# Patient Record
Sex: Male | Born: 1950 | Race: White | Hispanic: No | Marital: Single | Smoking: Former smoker
Health system: Southern US, Community
[De-identification: ages and names within clinical notes are randomized; demographics above are authoritative.]

## PROBLEM LIST (undated history)

## (undated) DIAGNOSIS — J984 Other disorders of lung: Secondary | ICD-10-CM

## (undated) DIAGNOSIS — J449 Chronic obstructive pulmonary disease, unspecified: Secondary | ICD-10-CM

## (undated) DIAGNOSIS — I4891 Unspecified atrial fibrillation: Secondary | ICD-10-CM

## (undated) DIAGNOSIS — C801 Malignant (primary) neoplasm, unspecified: Secondary | ICD-10-CM

## (undated) DIAGNOSIS — M1990 Unspecified osteoarthritis, unspecified site: Secondary | ICD-10-CM

## (undated) DIAGNOSIS — I82409 Acute embolism and thrombosis of unspecified deep veins of unspecified lower extremity: Secondary | ICD-10-CM

## (undated) DIAGNOSIS — I10 Essential (primary) hypertension: Secondary | ICD-10-CM

## (undated) DIAGNOSIS — I509 Heart failure, unspecified: Secondary | ICD-10-CM

## (undated) DIAGNOSIS — I499 Cardiac arrhythmia, unspecified: Secondary | ICD-10-CM

## (undated) DIAGNOSIS — R51 Headache: Secondary | ICD-10-CM

## (undated) DIAGNOSIS — Z923 Personal history of irradiation: Secondary | ICD-10-CM

## (undated) DIAGNOSIS — J189 Pneumonia, unspecified organism: Secondary | ICD-10-CM

## (undated) DIAGNOSIS — N189 Chronic kidney disease, unspecified: Secondary | ICD-10-CM

## (undated) DIAGNOSIS — Z9221 Personal history of antineoplastic chemotherapy: Secondary | ICD-10-CM

## (undated) DIAGNOSIS — R519 Headache, unspecified: Secondary | ICD-10-CM

## (undated) HISTORY — DX: Essential (primary) hypertension: I10

## (undated) HISTORY — DX: Pneumonia, unspecified organism: J18.9

## (undated) HISTORY — DX: Chronic kidney disease, unspecified: N18.9

## (undated) HISTORY — DX: Malignant (primary) neoplasm, unspecified: C80.1

## (undated) HISTORY — PX: TONSILLECTOMY: SUR1361

## (undated) HISTORY — DX: Chronic obstructive pulmonary disease, unspecified: J44.9

## (undated) HISTORY — PX: KNEE SURGERY: SHX244

## (undated) HISTORY — DX: Unspecified atrial fibrillation: I48.91

## (undated) HISTORY — DX: Heart failure, unspecified: I50.9

## (undated) HISTORY — DX: Other disorders of lung: J98.4

---

## 2013-08-09 ENCOUNTER — Inpatient Hospital Stay: Payer: Self-pay | Admitting: Internal Medicine

## 2013-08-09 LAB — URINALYSIS, COMPLETE
Bacteria: NONE SEEN
Bilirubin,UR: NEGATIVE
Glucose,UR: NEGATIVE mg/dL (ref 0–75)
Ketone: NEGATIVE
LEUKOCYTE ESTERASE: NEGATIVE
NITRITE: NEGATIVE
Ph: 5 (ref 4.5–8.0)
RBC,UR: 1 /HPF (ref 0–5)
SQUAMOUS EPITHELIAL: NONE SEEN
Specific Gravity: 1.044 (ref 1.003–1.030)

## 2013-08-09 LAB — COMPREHENSIVE METABOLIC PANEL
ALK PHOS: 100 U/L
ALT: 18 U/L (ref 12–78)
AST: 32 U/L (ref 15–37)
Albumin: 3.2 g/dL — ABNORMAL LOW (ref 3.4–5.0)
Anion Gap: 11 (ref 7–16)
BUN: 27 mg/dL — ABNORMAL HIGH (ref 7–18)
Bilirubin,Total: 2 mg/dL — ABNORMAL HIGH (ref 0.2–1.0)
CALCIUM: 8.4 mg/dL — AB (ref 8.5–10.1)
CO2: 24 mmol/L (ref 21–32)
Chloride: 96 mmol/L — ABNORMAL LOW (ref 98–107)
Creatinine: 1.48 mg/dL — ABNORMAL HIGH (ref 0.60–1.30)
GFR CALC AF AMER: 58 — AB
GFR CALC NON AF AMER: 50 — AB
Glucose: 116 mg/dL — ABNORMAL HIGH (ref 65–99)
Osmolality: 269 (ref 275–301)
Potassium: 3.6 mmol/L (ref 3.5–5.1)
Sodium: 131 mmol/L — ABNORMAL LOW (ref 136–145)
TOTAL PROTEIN: 7.4 g/dL (ref 6.4–8.2)

## 2013-08-09 LAB — CK: CK, TOTAL: 723 U/L — AB

## 2013-08-09 LAB — CK-MB
CK-MB: 1.9 ng/mL (ref 0.5–3.6)
CK-MB: 2.7 ng/mL (ref 0.5–3.6)
CK-MB: 3 ng/mL (ref 0.5–3.6)

## 2013-08-09 LAB — CBC
HCT: 43 % (ref 40.0–52.0)
HGB: 14.2 g/dL (ref 13.0–18.0)
MCH: 27.9 pg (ref 26.0–34.0)
MCHC: 33.1 g/dL (ref 32.0–36.0)
MCV: 84 fL (ref 80–100)
PLATELETS: 260 10*3/uL (ref 150–440)
RBC: 5.11 10*6/uL (ref 4.40–5.90)
RDW: 13.7 % (ref 11.5–14.5)
WBC: 24.9 10*3/uL — AB (ref 3.8–10.6)

## 2013-08-09 LAB — PRO B NATRIURETIC PEPTIDE: B-Type Natriuretic Peptide: 3018 pg/mL — ABNORMAL HIGH (ref 0–125)

## 2013-08-09 LAB — TROPONIN I
TROPONIN-I: 1.2 ng/mL — AB
Troponin-I: 1.2 ng/mL — ABNORMAL HIGH
Troponin-I: 1.3 ng/mL — ABNORMAL HIGH

## 2013-08-09 LAB — LIPASE, BLOOD: LIPASE: 32 U/L — AB (ref 73–393)

## 2013-08-10 LAB — BASIC METABOLIC PANEL
Anion Gap: 10 (ref 7–16)
BUN: 34 mg/dL — ABNORMAL HIGH (ref 7–18)
CALCIUM: 8 mg/dL — AB (ref 8.5–10.1)
CO2: 28 mmol/L (ref 21–32)
CREATININE: 1.58 mg/dL — AB (ref 0.60–1.30)
Chloride: 97 mmol/L — ABNORMAL LOW (ref 98–107)
EGFR (African American): 54 — ABNORMAL LOW
GFR CALC NON AF AMER: 46 — AB
Glucose: 140 mg/dL — ABNORMAL HIGH (ref 65–99)
Osmolality: 280 (ref 275–301)
Potassium: 3.4 mmol/L — ABNORMAL LOW (ref 3.5–5.1)
Sodium: 135 mmol/L — ABNORMAL LOW (ref 136–145)

## 2013-08-10 LAB — CBC WITH DIFFERENTIAL/PLATELET
BANDS NEUTROPHIL: 18 %
Comment - H1-Com4: NORMAL
Eosinophil: 1 %
HCT: 43.5 % (ref 40.0–52.0)
HGB: 14.8 g/dL (ref 13.0–18.0)
Lymphocytes: 11 %
MCH: 28.7 pg (ref 26.0–34.0)
MCHC: 34 g/dL (ref 32.0–36.0)
MCV: 84 fL (ref 80–100)
METAMYELOCYTE: 6 %
MONOS PCT: 4 %
MYELOCYTE: 1 %
PLATELETS: 202 10*3/uL (ref 150–440)
RBC: 5.16 10*6/uL (ref 4.40–5.90)
RDW: 13.5 % (ref 11.5–14.5)
Segmented Neutrophils: 59 %
WBC: 19.2 10*3/uL — AB (ref 3.8–10.6)

## 2013-08-10 LAB — MAGNESIUM: MAGNESIUM: 1.9 mg/dL

## 2013-08-11 LAB — CBC WITH DIFFERENTIAL/PLATELET
BASOS ABS: 0 10*3/uL (ref 0.0–0.1)
BASOS PCT: 0.2 %
EOS ABS: 0 10*3/uL (ref 0.0–0.7)
Eosinophil %: 0.1 %
HCT: 40 % (ref 40.0–52.0)
HGB: 13.3 g/dL (ref 13.0–18.0)
LYMPHS ABS: 0.8 10*3/uL — AB (ref 1.0–3.6)
Lymphocyte %: 3.9 %
MCH: 27.5 pg (ref 26.0–34.0)
MCHC: 33.1 g/dL (ref 32.0–36.0)
MCV: 83 fL (ref 80–100)
MONOS PCT: 6.7 %
Monocyte #: 1.5 x10 3/mm — ABNORMAL HIGH (ref 0.2–1.0)
Neutrophil #: 19.4 10*3/uL — ABNORMAL HIGH (ref 1.4–6.5)
Neutrophil %: 89.1 %
Platelet: 197 10*3/uL (ref 150–440)
RBC: 4.83 10*6/uL (ref 4.40–5.90)
RDW: 13.4 % (ref 11.5–14.5)
WBC: 21.7 10*3/uL — AB (ref 3.8–10.6)

## 2013-08-11 LAB — BASIC METABOLIC PANEL
Anion Gap: 6 — ABNORMAL LOW (ref 7–16)
BUN: 30 mg/dL — ABNORMAL HIGH (ref 7–18)
CO2: 27 mmol/L (ref 21–32)
Calcium, Total: 8 mg/dL — ABNORMAL LOW (ref 8.5–10.1)
Chloride: 103 mmol/L (ref 98–107)
Creatinine: 1.17 mg/dL (ref 0.60–1.30)
Glucose: 189 mg/dL — ABNORMAL HIGH (ref 65–99)
Osmolality: 283 (ref 275–301)
Potassium: 3.7 mmol/L (ref 3.5–5.1)
Sodium: 136 mmol/L (ref 136–145)

## 2013-08-11 LAB — LIPID PANEL
CHOLESTEROL: 100 mg/dL (ref 0–200)
HDL: 7 mg/dL — AB (ref 40–60)
LDL CHOLESTEROL, CALC: 40 mg/dL (ref 0–100)
Triglycerides: 266 mg/dL — ABNORMAL HIGH (ref 0–200)
VLDL CHOLESTEROL, CALC: 53 mg/dL — AB (ref 5–40)

## 2013-08-11 LAB — URINE CULTURE

## 2013-08-11 LAB — HEMOGLOBIN A1C: Hemoglobin A1C: 5.9 % (ref 4.2–6.3)

## 2013-08-12 LAB — BASIC METABOLIC PANEL
Anion Gap: 4 — ABNORMAL LOW (ref 7–16)
BUN: 27 mg/dL — ABNORMAL HIGH (ref 7–18)
CALCIUM: 8 mg/dL — AB (ref 8.5–10.1)
CHLORIDE: 106 mmol/L (ref 98–107)
CO2: 28 mmol/L (ref 21–32)
CREATININE: 1.01 mg/dL (ref 0.60–1.30)
EGFR (Non-African Amer.): >60
Glucose: 140 mg/dL — ABNORMAL HIGH (ref 65–99)
OSMOLALITY: 283 (ref 275–301)
Potassium: 4 mmol/L (ref 3.5–5.1)
Sodium: 138 mmol/L (ref 136–145)

## 2013-08-12 LAB — CBC WITH DIFFERENTIAL/PLATELET
BASOS PCT: 0.1 %
Basophil #: 0 10*3/uL (ref 0.0–0.1)
Eosinophil #: 0 10*3/uL (ref 0.0–0.7)
Eosinophil %: 0 %
HCT: 37.1 % — ABNORMAL LOW (ref 40.0–52.0)
HGB: 12.5 g/dL — ABNORMAL LOW (ref 13.0–18.0)
LYMPHS ABS: 1.2 10*3/uL (ref 1.0–3.6)
Lymphocyte %: 4.5 %
MCH: 28.1 pg (ref 26.0–34.0)
MCHC: 33.7 g/dL (ref 32.0–36.0)
MCV: 83 fL (ref 80–100)
MONO ABS: 1.2 x10 3/mm — AB (ref 0.2–1.0)
Monocyte %: 4.5 %
NEUTROS ABS: 23.4 10*3/uL — AB (ref 1.4–6.5)
Neutrophil %: 90.9 %
Platelet: 211 10*3/uL (ref 150–440)
RBC: 4.44 10*6/uL (ref 4.40–5.90)
RDW: 13.7 % (ref 11.5–14.5)
WBC: 25.8 10*3/uL — AB (ref 3.8–10.6)

## 2013-08-12 LAB — MAGNESIUM: Magnesium: 2.2 mg/dL

## 2013-08-13 LAB — CBC WITH DIFFERENTIAL/PLATELET
BASOS ABS: 0 10*3/uL (ref 0.0–0.1)
Basophil %: 0.1 %
EOS ABS: 0 10*3/uL (ref 0.0–0.7)
Eosinophil %: 0 %
HCT: 35.9 % — AB (ref 40.0–52.0)
HGB: 11.7 g/dL — ABNORMAL LOW (ref 13.0–18.0)
Lymphocyte #: 1.3 10*3/uL (ref 1.0–3.6)
Lymphocyte %: 6.8 %
MCH: 27.5 pg (ref 26.0–34.0)
MCHC: 32.6 g/dL (ref 32.0–36.0)
MCV: 85 fL (ref 80–100)
Monocyte #: 1 x10 3/mm (ref 0.2–1.0)
Monocyte %: 4.9 %
Neutrophil #: 17.1 10*3/uL — ABNORMAL HIGH (ref 1.4–6.5)
Neutrophil %: 88.2 %
PLATELETS: 225 10*3/uL (ref 150–440)
RBC: 4.26 10*6/uL — ABNORMAL LOW (ref 4.40–5.90)
RDW: 13.9 % (ref 11.5–14.5)
WBC: 19.4 10*3/uL — ABNORMAL HIGH (ref 3.8–10.6)

## 2013-08-13 LAB — BASIC METABOLIC PANEL
ANION GAP: 4 — AB (ref 7–16)
BUN: 30 mg/dL — AB (ref 7–18)
CREATININE: 1 mg/dL (ref 0.60–1.30)
Calcium, Total: 8 mg/dL — ABNORMAL LOW (ref 8.5–10.1)
Chloride: 105 mmol/L (ref 98–107)
Co2: 29 mmol/L (ref 21–32)
EGFR (African American): >60
EGFR (Non-African Amer.): >60
Glucose: 147 mg/dL — ABNORMAL HIGH (ref 65–99)
Osmolality: 285 (ref 275–301)
Potassium: 4 mmol/L (ref 3.5–5.1)
SODIUM: 138 mmol/L (ref 136–145)

## 2013-08-14 LAB — CBC WITH DIFFERENTIAL/PLATELET
BASOS PCT: 0.2 %
Basophil #: 0 10*3/uL (ref 0.0–0.1)
EOS ABS: 0.5 10*3/uL (ref 0.0–0.7)
Eosinophil %: 2.4 %
HCT: 39.4 % — AB (ref 40.0–52.0)
HGB: 12.9 g/dL — ABNORMAL LOW (ref 13.0–18.0)
Lymphocyte #: 1.8 10*3/uL (ref 1.0–3.6)
Lymphocyte %: 8.3 %
MCH: 27.6 pg (ref 26.0–34.0)
MCHC: 32.7 g/dL (ref 32.0–36.0)
MCV: 84 fL (ref 80–100)
MONO ABS: 0.9 x10 3/mm (ref 0.2–1.0)
Monocyte %: 4.2 %
NEUTROS PCT: 84.9 %
Neutrophil #: 18.3 10*3/uL — ABNORMAL HIGH (ref 1.4–6.5)
Platelet: 278 10*3/uL (ref 150–440)
RBC: 4.67 10*6/uL (ref 4.40–5.90)
RDW: 13.9 % (ref 11.5–14.5)
WBC: 21.5 10*3/uL — AB (ref 3.8–10.6)

## 2013-08-14 LAB — CULTURE, BLOOD (SINGLE)

## 2013-08-15 LAB — CBC WITH DIFFERENTIAL/PLATELET
BASOS ABS: 0 10*3/uL (ref 0.0–0.1)
BASOS PCT: 0.1 %
EOS PCT: 5.1 %
Eosinophil #: 1.4 10*3/uL — ABNORMAL HIGH (ref 0.0–0.7)
HCT: 41.1 % (ref 40.0–52.0)
HGB: 13.7 g/dL (ref 13.0–18.0)
LYMPHS ABS: 2.2 10*3/uL (ref 1.0–3.6)
Lymphocyte %: 8 %
MCH: 28 pg (ref 26.0–34.0)
MCHC: 33.4 g/dL (ref 32.0–36.0)
MCV: 84 fL (ref 80–100)
Monocyte #: 0.1 x10 3/mm — ABNORMAL LOW (ref 0.2–1.0)
Monocyte %: 0.2 %
NEUTROS ABS: 23.5 10*3/uL — AB (ref 1.4–6.5)
Neutrophil %: 86.6 %
Platelet: 371 10*3/uL (ref 150–440)
RBC: 4.91 10*6/uL (ref 4.40–5.90)
RDW: 14 % (ref 11.5–14.5)
WBC: 27.1 10*3/uL — ABNORMAL HIGH (ref 3.8–10.6)

## 2013-08-18 LAB — CULTURE, BLOOD (SINGLE)

## 2013-11-22 DIAGNOSIS — J449 Chronic obstructive pulmonary disease, unspecified: Secondary | ICD-10-CM | POA: Insufficient documentation

## 2013-11-22 DIAGNOSIS — J189 Pneumonia, unspecified organism: Secondary | ICD-10-CM | POA: Insufficient documentation

## 2013-11-22 DIAGNOSIS — I509 Heart failure, unspecified: Secondary | ICD-10-CM | POA: Insufficient documentation

## 2013-11-22 DIAGNOSIS — I4891 Unspecified atrial fibrillation: Secondary | ICD-10-CM | POA: Insufficient documentation

## 2013-11-22 DIAGNOSIS — N189 Chronic kidney disease, unspecified: Secondary | ICD-10-CM | POA: Insufficient documentation

## 2013-11-25 DIAGNOSIS — J984 Other disorders of lung: Secondary | ICD-10-CM | POA: Insufficient documentation

## 2013-11-25 DIAGNOSIS — I10 Essential (primary) hypertension: Secondary | ICD-10-CM | POA: Insufficient documentation

## 2014-08-30 NOTE — H&P (Signed)
PATIENT NAME:  Sean Hall, PROPES MR#:  [id number] DATE OF BIRTH:  1950/10/12  DATE OF ADMISSION:  08/09/2013  ADMITTING PHYSICIAN: Gladstone Lighter, MD  PRIMARY CARE PHYSICIAN: None.   CHIEF COMPLAINT: Difficulty breathing.   HISTORY OF PRESENT ILLNESS: Mr. Brandow is a 64 year old Caucasian male with no known significant past medical history because he has not seen a physician for several years. 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 He has been doing fine up until 2 days ago when he started to have some sinus drainage, cough and congestion. He did not take any medications or did not see a physician and slowly his breathing got worse to the point that he was tachypneic and was having coarse wheeze and gurgling sensation breathing last night. His wife convinced him to come to the ER this morning. He is hypoxic and on nonrebreather mask at this time, has an elevated white count and chest x-ray showing significant multifocal pneumonia, which was confirmed by the CT scan. CT of the chest did not show any PE. The patient complains of cough, low-grade fevers and chills, congestion and dyspnea on exertion. He had also some nausea and vomiting yesterday. No chest pain, although he has some left-sided abdominal pain which is pleuritic in nature, could be in the lower chest area.  PAST MEDICAL HISTORY: Tobacco use disorder, undiagnosed underlying COPD.  PAST SURGICAL HISTORY: Knee surgery in the past.   ALLERGIES TO MEDICATIONS: Aleve can cause rash and hives.  CURRENT HOME MEDICATIONS: Multivitamin over-the-counter once a day.  SOCIAL HISTORY: Works as a Comptroller. Lives at home with his wife. No alcohol or drug use. Smokes about 1 pack every day.   FAMILY HISTORY: Significant for hypertension.  REVIEW OF SYSTEMS:  CONSTITUTIONAL: Positive for fever, fatigue and weakness.  EYES: No blurred vision, double  vision, inflammation or glaucoma.  ENT: No tinnitus, ear pain, hearing loss, epistaxis or discharge.  RESPIRATORY: Positive for cough, wheeze, COPD and dyspnea.  CARDIOVASCULAR: No chest pain. Positive for orthopnea. No edema, arrhythmia. Positive dyspnea on exertion. No palpitations or syncope.  GASTROINTESTINAL: Positive for nausea and vomiting. No diarrhea, abdominal pain. No hematemesis or melena.  GENITOURINARY: No dysuria, hematuria, renal calculus, frequency or incontinence.  ENDOCRINE: No polyuria, nocturia, thyroid problems, heat or cold intolerance.  HEMATOLOGY: No anemia, easy bruising or bleeding.  SKIN: No acne, rash or lesions.  MUSCULOSKELETAL: No neck, back, shoulder pain, arthritis or gout.  NEUROLOGICAL: No numbness, weakness, CVA, TIA or seizures.  PSYCHOLOGICAL: No anxiety, insomnia, depression.   PHYSICAL EXAMINATION: VITAL SIGNS: Temperature 97.2 degrees Fahrenheit, pulse 125, respirations 22, blood pressure 154/70, pulse ox 88% on room air.  GENERAL: Well-built, well-nourished disheveled-appearing male lying in bed in mild acute respiratory distress.  HEENT: Normocephalic, atraumatic. Pupils are equal, round, and reacting to light. Anicteric sclerae. Extraocular movements intact. Oropharynx clear without erythema, mass or exudate.  NECK: Supple. No thyromegaly, JVD or carotid bruits. No lymphadenopathy.  LUNGS: Coarse wheeze and crackles heard bilateral in both lung diffusely, more prominent in the lower lobes. Minimal use of accessory muscles on breathing, especially with exertion. HEART: S1 and S2 rapid rate and regular rhythm. No murmurs, rubs or gallops.  ABDOMEN: Soft, nontender, nondistended. No hepatosplenomegaly. Normal bowel sounds.  EXTREMITIES: No pedal edema. No clubbing or cyanosis. 2+ dorsalis pedis pulses palpable bilaterally.  SKIN: No acne, rash or lesions.  LYMPHATICS: No  cervical lymphadenopathy.  NEUROLOGIC: Cranial nerves intact. No focal motor or  sensory deficits. PSYCHOLOGIC: The patient is awake, alert and oriented x3.   DIAGNOSTIC DATA: Laboratory: WBC 24.9, hemoglobin 14.2, hematocrit 43, platelet count 216,000.   Sodium 131, potassium 3.6, chloride 96, bicarb 24, BUN 27, creatinine 1.48. glucose 116 and calcium 8.4.   ALT 18, AST 32, alkaline phosphatase 100, total bili 2.0, albumin 3.2. Lipase 32 and troponin 1.2  CPK/MB is 1.9. BNP is elevated at 3,018.   Chest x-ray revealing suspected left lung mass measuring 14 cm, less likely the appearance could be cause for infection too. CT of the chest is recommended.   CT of the chest done showing no evidence of any PE. There is moderate consolidation over left upper lobe with associated air bronchograms, minimal patchy hazy nodular opacification throughout the lungs. Tiny amount of pleural effusion, reactive adenopathy and multifocal pneumonia. Mild to moderate inflammatory disease and a 1.4 cm peripheral nodule over the right lower lobe. Outpatient follow-up with CT chest recommended.  EKG showing sinus tachycardia, heart rate of 115, no acute ST-T wave abnormalities.   ASSESSMENT AND PLAN: This is a 64 year old male with no significant past medical history, has not visited a physician for years, brought in for acute respiratory distress, noted to be septic and having multifocal pneumonia.  1.  Sepsis secondary to multifocal pneumonia, also has underlying chronic obstructive pulmonary disease, not diagnosed prior. However, appears tachypneic so we will get an ABG and start on broad-spectrum antibiotics with vancomycin, Zosyn and Levaquin for his pneumonia. Blood cultures have been ordered. 2.  Acute hypoxic respiratory failure, pneumonia and chronic obstructive pulmonary disease are the causes. Get an ABG, oxygen support, antibiotics, steroids, and nebs for now. 3.  Acute renal failure. Follow-up. Gentle fluids. Acute tubular necrosis from sepsis likely.  4.  Elevated troponin. No chest  pain. Could be demand ischemia. No EKG changes. However, non-ST-elevation myocardial infarction cannot be ruled out. Will monitor on telemetry. Recycle troponins. Continue aspirin and on therapeutic treatment with Lovenox until the next troponins are back.  5.  Tobacco use disorder. Counseled against smoking for 3 minutes and placed on nicotine patch.   CODE STATUS: FULL.  TIME SPENT ON ADMISSION: 50 minutes.  ____________________________ Gladstone Lighter, MD rk:sb D: 08/09/2013 15:32:05 ET T: 08/09/2013 15:52:31 ET JOB#: [id number]  cc: Gladstone Lighter, MD, <Dictator> Gladstone Lighter MD ELECTRONICALLY SIGNED 08/15/2013 14:09

## 2014-08-30 NOTE — Consult Note (Signed)
PATIENT NAME:  Sean Hall, Sean Hall MR#:  [id number] DATE OF BIRTH:  30-Jan-1951  DATE OF CONSULTATION:  08/11/2013  REFERRING PHYSICIAN:  Dr. Bridgett Larsson. CONSULTING PHYSICIAN:  Isaias Cowman, MD  PRIMARY CARE PHYSICIAN:  None.    CHIEF COMPLAINT: Shortness of breath.   REASON FOR CONSULTATION: Consultation requested for evaluation of atrial fibrillation.   HISTORY OF PRESENT ILLNESS: The patient is a 64 year old gentleman without significant past medical history. The patient was in his usual state of health until 2 days prior to admission when he started to experience upper respiratory infection with cough, sinus drainage and congestion. On the morning of admission, the patient felt much worse, started developing fever and chills and presented to [facility] Emergency Room. In the Emergency Room, the patient was noted to be in respiratory distress, hypoxic. Chest x-ray revealing evidence for pneumonia. CT scan was negative for pulmonary emboli. The patient was also noted to be in tachycardia. EKG revealing atrial fibrillation with a rapid ventricular rate. The patient also had acute renal failure felt to be consistent with dehydration.   PAST MEDICAL HISTORY: None.   MEDICATIONS: None.   SOCIAL HISTORY: The patient lives with his girlfriend. He smokes a pack of cigarettes a day. He works as a Building control surveyor.   FAMILY HISTORY: No immediate family history of coronary artery disease or myocardial infarction.   REVIEW OF SYSTEMS:    CONSTITUTIONAL: The patient denied fever or chills.  EYES: No blurry vision.  EARS: No hearing loss.  RESPIRATORY: The patient has shortness of breath, cough and congestion.  CARDIOVASCULAR: The patient denies chest pain or palpitations.  GASTROINTESTINAL: No nausea, vomiting or diarrhea.  GENITOURINARY: No dysuria or hematuria.  ENDOCRINE: No polyuria or polydipsia.  MUSCULOSKELETAL: No arthralgias or myalgias.  NEUROLOGICAL: No focal muscle weakness or numbness.   PSYCHOLOGICAL: No depression or anxiety.   PHYSICAL EXAMINATION: VITAL SIGNS: Blood pressure 115/73, pulse 111 and irregular, respirations 18, temperature 97.6, pulse ox 94%.  HEENT: Pupils equal, reactive to light and accommodation.  NECK: Supple without thyromegaly.  LUNGS: Clear.  CARDIOVASCULAR:  Normal JVP. Normal PMI. Regular rate and rhythm. Normal S1, S2. No appreciable gallop, murmur or rub.  ABDOMEN: Soft and nontender. Pulses were intact bilaterally.  MUSCULOSKELETAL: Normal muscle tone.  NEUROLOGIC: The patient is alert and oriented x 3. Motor and sensory both grossly intact.   IMPRESSION: A 64 year old gentleman who presents with pneumonia, noted to be in atrial fibrillation which appears asymptomatic. Heart rate still moderately elevated. The patient has a CHADS2 score of zero.   RECOMMENDATIONS: 1.  Agree with overall current therapy.  2.  Continue aspirin for stroke prevention.  3.  Up-titrate diltiazem to 60 mg p.o. q.6.  4.  Review 2-D echocardiogram.   ____________________________ Isaias Cowman, MD ap:cs D: 08/11/2013 11:21:31 ET T: 08/11/2013 14:39:26 ET JOB#: [id number]  cc: Isaias Cowman, MD, <Dictator> Isaias Cowman MD ELECTRONICALLY SIGNED 09/10/2013 12:43

## 2014-08-30 NOTE — Discharge Summary (Signed)
PATIENT NAME:  Sean Hall, Sean Hall MR#:  [id number] DATE OF BIRTH:  07/24/1950  DATE OF ADMISSION:  08/09/2013 DATE OF DISCHARGE:  08/15/2013  DISCHARGE DIAGNOSES:  1. Sepsis secondary to pneumonia.  2. Acute renal failure.  3. Chronic obstructive pulmonary disease. 4. Acute hypoxic respiratory failure.  5. New-onset atrial fibrillation.  6. Acute myocardial infarction.   DISCHARGE MEDICATIONS:  1. Aspirin 325 mg p.o. daily.  2. furosemide 20 > mg p.o. daily. 3. Cardizem 120 mg p.o. daily.  4. Levaquin 750 mg p.o. daily for 7 days.  5. Prednisone 20 mg 2 tablets daily for 2 days and 1 tablet daily for 2 days and then stop.  6. Combivent Respimat 1 puff 4 times daily.   CONSULTATIONS: Cardiology consult with Dr. Saralyn Pilar.   FOLLOWUP: The patient has a followup appointment with Dr. Saralyn Pilar on April 22nd at 2:30 p.m. The patient advised to make a new appointment with primary doctor. He wants to see Dr. Ola Spurr, so I told him to follow up with him as his primary doctor.   HOSPITAL COURSE: This is a 64 year old male patient with no past medical history except for history of tobacco abuse, who came in because of shortness of breath, cough and congestion. The patient's x-ray of chest showed bilateral pneumonia, and CT chest did not show any pulmonary emboli. The patient was tachycardic on admission with heart rate 125 and O2 saturations were 88% on room air.   1. Acute hypoxic respiratory failure secondary to multilobar pneumonia. He was given vancomycin, Levaquin, Zosyn along with Solu-Medrol, oxygen and nebulizers. The patient's blood cultures came back positive for Streptococcal pneumoniae.  2. Sepsis due to Streptococcal pneumoniae coming from pneumonia. The patient already got vancomycin, Levaquin and Zosyn, and he received fluids. The patient's symptoms improved, and his repeat blood cultures came back negative. His initial white count on admission was 24.9.  3. New-onset atrial  fibrillation with rapid ventricular response. The patient was seen by Dr. Saralyn Pilar. The patient CHADS2 score was zero. He suggested to continue only aspirin and Cardizem. The patient continued to be in atrial fibrillation. The patient will follow up with Dr. Saralyn Pilar as an outpatient regarding repeating echo and possibly adjusting his medications. Right now, he is on aspirin and Cardizem. Initially he was on Cardizem 60 q.6, then changed to Cardizem CD 240 mg daily, but he developed bradycardia with heart rate in 45s, so I had to cut down on Cardizem, and the patient discharged home with Cardizem CD 120 mg daily.  4. Systolic heart failure. The patient's echo showed EF of 35%, and he had troponin elevation of 1.30 and then initial one was 1.20. The patient's CK was normal. EKG did not show any acute changes, and the patient's troponin elevation thought to be secondary to demand ischemia because of respiratory failure.  5. Acute renal failure. The patient's kidney function on admission showed BUN of 27, creatinine 1.48. He received IV fluids, and his renal function normalized. The patient's creatinine was 1 on April 7th.   6. Leukocytosis due to steroids. The patient is afebrile, but white count continued to be around 21, and then the patient's steroids were weaned down. I told him that he will follow up with Dr. Ola Spurr to make sure his white count is coming down.  7. Possible pulmonary nodule. Seen by Dr. Devona Konig. CT chest was done on April 3rd which showed no evidence of PE. It showed moderate consolidation in the left upper lobe with air bronchograms  and some atelectasis, and the patient has reactive lymphadenopathy, and there is a 1.4 cm peripheral nodule in the right lower lobe. The patient needs to have repeat CAT scan once the pneumonia resolves, and he needs a CAT scan in 4 to 6 weeks. The patient will follow up with Dr. Devona Konig, and also he will follow with Dr. Saralyn Pilar. The patient is a  smoker, and he told me that he is going to quit.  8. The patient's LDL was less than 100.  9. Regarding his systolic heart failure, the patient is on Lasix. He did receive IV Lasix. He will be needing probably ACE inhibitors. Dr. Saralyn Pilar can start that.  10. Regarding his myocardial infarction, the patient was given aspirin, but no beta blockers because of his COPD flare, and the patient not given statins because his LDL is less than 100.   TIME SPENT ON DISCHARGE PREPARATION: More than 30 minutes.   ____________________________ Epifanio Lesches, MD sk:lb D: 08/16/2013 42:68:34 ET T: 08/17/2013 06:54:22 ET JOB#: [id number]  cc: Epifanio Lesches, MD, <Dictator> Epifanio Lesches MD ELECTRONICALLY SIGNED 08/31/2013 7:45

## 2015-05-10 DIAGNOSIS — C801 Malignant (primary) neoplasm, unspecified: Secondary | ICD-10-CM

## 2015-05-10 HISTORY — DX: Malignant (primary) neoplasm, unspecified: C80.1

## 2015-09-10 ENCOUNTER — Encounter: Payer: Self-pay | Admitting: *Deleted

## 2015-09-10 ENCOUNTER — Inpatient Hospital Stay: Payer: Medicaid Other | Attending: Cardiothoracic Surgery | Admitting: Cardiothoracic Surgery

## 2015-09-10 ENCOUNTER — Encounter: Payer: Self-pay | Admitting: Cardiothoracic Surgery

## 2015-09-10 VITALS — BP 151/75 | HR 83 | Temp 97.0°F | Ht 75.0 in | Wt 168.8 lb

## 2015-09-10 DIAGNOSIS — R05 Cough: Secondary | ICD-10-CM | POA: Insufficient documentation

## 2015-09-10 DIAGNOSIS — R634 Abnormal weight loss: Secondary | ICD-10-CM | POA: Diagnosis not present

## 2015-09-10 DIAGNOSIS — R918 Other nonspecific abnormal finding of lung field: Secondary | ICD-10-CM | POA: Diagnosis present

## 2015-09-10 DIAGNOSIS — R0602 Shortness of breath: Secondary | ICD-10-CM | POA: Diagnosis not present

## 2015-09-10 DIAGNOSIS — Z87891 Personal history of nicotine dependence: Secondary | ICD-10-CM | POA: Insufficient documentation

## 2015-09-10 DIAGNOSIS — Z7982 Long term (current) use of aspirin: Secondary | ICD-10-CM | POA: Insufficient documentation

## 2015-09-10 NOTE — Progress Notes (Signed)
Patient here for initial consult. Recently had lung infection, has experienced 30 pound wt. Loss since January.

## 2015-09-10 NOTE — Progress Notes (Signed)
Patient ID: Sean Hall, male   DOB: [birth year], 65 y.o.   MRN: [id number]  Chief Complaint  Patient presents with  . Lung Mass    Referred By Dr. Kem Kays  Reason for Referral Lung mass  HPI Location, Quality, Duration, Severity, Timing, Context, Modifying Factors, Associated Signs and Symptoms.  Sean Hall is a 65 y.o. male.  This gentleman is referred to [place] today by Dr. Sabra Heck. He states that he was in his usual state of health until a couple months ago when he began experiencing increasing shortness of breath and cough associated with occasional green sputum but no hemoptysis. In addition his had a proximal 30 pound weight loss over the last several months and when he presented to his primary care physician a chest x-ray was obtained. Chest x-ray was abnormal and he was referred here for further consideration of diagnostic or therapeutic options. Unfortunately the chest x-ray was obtained at the current nodal clinic and I do not have access to that at this time. The patient states that he does not have medical insurance and although he was instructed to obtain a CT scan he has been reluctant to do so because of the cost involved. He states that he was diagnosed with pneumonia and placed on Levaquin. He denied any fevers but did have occasional night sweats. He is a lifelong smoker but quit in 2014 having smoked 2 packs cigarettes a day for close to 50 years. There is no family history of lung cancer. 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   No past medical history on file.  History reviewed. No pertinent past surgical history.  Family History  Problem Relation Age of Onset  . Diabetes Father   . Hypertension Father     Social History Social History  Substance Use Topics  . Smoking status: None  . Smokeless tobacco: None  . Alcohol Use: None    Allergies  Allergen Reactions  . Aleve [Naproxen Sodium] Hives, Shortness Of  Breath and Swelling    Naproxin    Current Outpatient Prescriptions  Medication Sig Dispense Refill  . albuterol (PROAIR HFA) 108 (90 Base) MCG/ACT inhaler Inhale into the lungs.    . benzonatate (TESSALON) 200 MG capsule Take by mouth.    . levofloxacin (LEVAQUIN) 500 MG tablet Take by mouth.    Marland Kitchen aspirin EC 81 MG tablet Take by mouth.    . Multiple Vitamin (MULTI-VITAMINS) TABS Take by mouth.     No current facility-administered medications for this visit.      Review of Systems A complete review of systems was asked and was negative except for the following positive findingsWeight loss of 30 pounds, night sweats, hearing difficulty, chest pain, palpitations, shortness of breath, cough, wheezing, trouble swallowing, urination, joint pain, joint stiffness, frequent headaches, localize weakness, excessive thirst and urination, heat and cold intolerance, hayfever, hives.  Blood pressure 151/75, pulse 83, temperature 97 F (36.1 C), temperature source Tympanic, height '6\' 3"'$  (1.905 m), weight 168 lb 12.2 oz (76.55 kg).  Physical Exam CONSTITUTIONAL:  Pleasant, well-developed, well-nourished, and in no acute distress. EYES: Pupils equal and reactive to light, Sclera non-icteric EARS, NOSE, MOUTH AND THROAT:  The oropharynx was clear.  Dentition is good repair.  Oral mucosa pink and moist. LYMPH NODES:  Lymph nodes in the neck and axillae were normal RESPIRATORY:  Lungs were clear.  Normal respiratory effort without pathologic use of accessory muscles of respiration CARDIOVASCULAR: Heart was  regular without murmurs.  There were no carotid bruits. GI: The abdomen was soft, nontender, and nondistended. There were no palpable masses. There was no hepatosplenomegaly. There were normal bowel sounds in all quadrants. GU:  Rectal deferred.   MUSCULOSKELETAL:  Normal muscle strength and tone.  No clubbing or cyanosis.   SKIN:  There were no pathologic skin lesions.  There were no nodules on  palpation. NEUROLOGIC:  Sensation is normal.  Cranial nerves are grossly intact. PSYCH:  Oriented to person, place and time.  Mood and affect are normal.  Data Reviewed I have reviewed the records that were in the system. This consisted of the CT scan from 2015 showing a left upper lobe mass. In addition there was a echocardiogram showing global LV dysfunction. There are no other records.  I have personally reviewed the patient's imaging, laboratory findings and medical records.    Assessment    At the present time it would appear the patient is referred here with a diagnosis of a lung mass.    Plan    I will obtain the x-ray from current nodal clinic. I told the patient that I would most likely recommend obtaining a CT scan the chest. The patient is unwilling to do that at this point in time secondary to cost. I did discuss with our thoracic navigator if he would be a candidate for a lung cancer screening CT scan. He is not a candidate secondary to his symptoms. I will see him back again in one week after I obtain the chest x-ray.       Nestor Lewandowsky, MD 09/10/2015, 10:32 AM

## 2015-09-11 ENCOUNTER — Telehealth: Payer: Self-pay | Admitting: Cardiothoracic Surgery

## 2015-09-11 NOTE — Telephone Encounter (Signed)
I have called patient to make a follow up appointment with Dr Genevive Bi for RUL Lung nodule for 09/18/15 in our [employer]. No answer. I have left a message for patient to call back to make the appointment. X-Ray disk has been obtained from [facility].

## 2015-09-11 NOTE — Progress Notes (Signed)
Met with patient at thoracic surgery appointment. Introduced navigation program and reviewed plan of care. Will follow. 

## 2015-09-14 NOTE — Telephone Encounter (Signed)
Appointment has been made for 09/18/15 with Dr Genevive Bi.

## 2015-09-17 ENCOUNTER — Other Ambulatory Visit: Payer: Self-pay | Admitting: Cardiothoracic Surgery

## 2015-09-17 ENCOUNTER — Inpatient Hospital Stay
Admission: RE | Admit: 2015-09-17 | Discharge: 2015-09-17 | Disposition: A | Discharge status: Home / Self Care | Payer: Self-pay | Source: Ambulatory Visit | Attending: Cardiothoracic Surgery | Admitting: Cardiothoracic Surgery

## 2015-09-17 DIAGNOSIS — Z139 Encounter for screening, unspecified: Secondary | ICD-10-CM

## 2015-09-18 ENCOUNTER — Encounter: Payer: Self-pay | Admitting: Cardiothoracic Surgery

## 2015-09-18 ENCOUNTER — Ambulatory Visit (INDEPENDENT_AMBULATORY_CARE_PROVIDER_SITE_OTHER): Payer: Self-pay | Admitting: Cardiothoracic Surgery

## 2015-09-18 VITALS — BP 153/77 | HR 88 | Temp 97.7°F | Ht 75.0 in | Wt 168.0 lb

## 2015-09-18 DIAGNOSIS — R918 Other nonspecific abnormal finding of lung field: Secondary | ICD-10-CM

## 2015-09-18 NOTE — Progress Notes (Signed)
Sean Hall Inpatient Post-Op Note  Patient ID: Sean Hall, male   DOB: 1950/12/17, 65 y.o.   MRN: [id number]  HISTORY: This patient returns today in follow-up. We were able to obtain his chest x-ray from the current nodal clinic and have that digitized for my review. The patient continues to complain of being significantly tired and unable to perform the normal activities that he has enjoyed in the past. He has not had any fevers and states that he feels actually better after his most recent course of antibiotics.   Filed Vitals:   09/18/15 0922  BP: 153/77  Pulse: 88  Temp: 97.7 F (36.5 C)     EXAM: Resp: Lungs are clear bilaterally With the exception of the posterior right upper lobe area with there is diminished breath sounds..  No respiratory distress, normal effort. Heart:  Regular without murmurs Abd:  Abdomen is soft, non distended and non tender. No masses are palpable.  There is no rebound and no guarding.  Neurological: Alert and oriented to person, place, and time. Coordination normal.  Skin: Skin is warm and dry. No rash noted. No diaphoretic. No erythema. No pallor.  Psychiatric: Normal mood and affect. Normal behavior. Judgment and thought content normal.    ASSESSMENT: I have independently reviewed the patient's chest x-ray. There is a very large right lung mass measuring at least 10 cm. Whether or not this is all representative of a pneumonia or a postobstructive pneumonia secondary to a more centrally obstructing tumor is unclear on plain x-ray.   PLAN:   I have recommended to the patient that he undergo CT scanning with contrast to better delineate the right midlung mass. He will have that done. He would like to get it done today but all possible. I will see him back again once this is complete.    Nestor Lewandowsky, MD

## 2015-09-22 ENCOUNTER — Ambulatory Visit (INDEPENDENT_AMBULATORY_CARE_PROVIDER_SITE_OTHER): Payer: Self-pay | Admitting: Cardiothoracic Surgery

## 2015-09-22 ENCOUNTER — Ambulatory Visit
Admission: RE | Admit: 2015-09-22 | Discharge: 2015-09-22 | Disposition: A | Discharge status: Home / Self Care | Payer: Medicaid Other | Source: Ambulatory Visit | Attending: Cardiothoracic Surgery | Admitting: Cardiothoracic Surgery

## 2015-09-22 ENCOUNTER — Encounter: Payer: Self-pay | Admitting: Cardiothoracic Surgery

## 2015-09-22 VITALS — BP 144/76 | HR 81 | Temp 97.4°F | Ht 75.0 in | Wt 169.0 lb

## 2015-09-22 DIAGNOSIS — R918 Other nonspecific abnormal finding of lung field: Secondary | ICD-10-CM | POA: Insufficient documentation

## 2015-09-22 DIAGNOSIS — J90 Pleural effusion, not elsewhere classified: Secondary | ICD-10-CM | POA: Diagnosis not present

## 2015-09-22 DIAGNOSIS — I2510 Atherosclerotic heart disease of native coronary artery without angina pectoris: Secondary | ICD-10-CM | POA: Insufficient documentation

## 2015-09-22 MED ORDER — IOHEXOL 350 MG/ML SOLN
75.0000 mL | Freq: Once | INTRAVENOUS | Status: AC | PRN
  Administered 2015-09-22: 65 mL via INTRAVENOUS

## 2015-09-22 NOTE — Progress Notes (Signed)
Sean Hall Inpatient Post-Op Note  Patient ID: Sean Hall, male   DOB: Apr 15, 1951, 65 y.o.   MRN: [id number]  HISTORY: He returns today in follow-up. He states he's not been short of breath although he continues to have a cough which is productive of sputum but no blood. He denied any fevers. He states that his breathing is actually improved over the last week. He did have a CT scan done today.   Filed Vitals:   09/22/15 1051  BP: 144/76  Pulse: 81  Temp: 97.4 F (36.3 C)     EXAM: Resp: Lungs Show diminished breath sounds throughout. This is particularly true in the right upper lobe area. There are bibasilar rales..  No respiratory distress, normal effort. Heart:  Regular without murmurs Abd:  Abdomen is soft, non distended and non tender. No masses are palpable.  There is no rebound and no guarding.  Neurological: Alert and oriented to person, place, and time. Coordination normal.  Skin: Skin is warm and dry. No rash noted. No diaphoretic. No erythema. No pallor.  Psychiatric: Normal mood and affect. Normal behavior. Judgment and thought content normal.    ASSESSMENT: I have independently reviewed the chest CT. 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 I do not believe the patient is a candidate for surgical resection.   PLAN:   I discussed this with the patient. I believe that the most efficient way to make a diagnosis would be with bronchoscopy. The patient is on aspirin which have asked him to stop. I will ask our pulmonologist to see the patient and perform a bronchoscopy. I explained the patient that this mass is something that we would treat with radiation therapy and chemotherapy and not with surgery. We will make an appointment to see our oncologist and radiation therapist as well.    Nestor Lewandowsky, MD

## 2015-10-02 ENCOUNTER — Telehealth: Payer: Self-pay

## 2015-10-02 ENCOUNTER — Encounter: Payer: Self-pay | Admitting: *Deleted

## 2015-10-02 ENCOUNTER — Encounter
Admission: RE | Disposition: A | Discharge status: Home / Self Care | Payer: Self-pay | Source: Ambulatory Visit | Attending: Pulmonary Disease

## 2015-10-02 ENCOUNTER — Encounter: Payer: Self-pay | Admitting: Certified Registered Nurse Anesthetist

## 2015-10-02 ENCOUNTER — Ambulatory Visit
Admission: RE | Admit: 2015-10-02 | Discharge: 2015-10-02 | Disposition: A | Discharge status: Home / Self Care | Payer: Medicaid Other | Source: Ambulatory Visit | Attending: Pulmonary Disease | Admitting: Pulmonary Disease

## 2015-10-02 DIAGNOSIS — C3411 Malignant neoplasm of upper lobe, right bronchus or lung: Secondary | ICD-10-CM | POA: Diagnosis not present

## 2015-10-02 DIAGNOSIS — R918 Other nonspecific abnormal finding of lung field: Secondary | ICD-10-CM | POA: Diagnosis present

## 2015-10-02 SURGERY — BRONCHOSCOPY, FLEXIBLE
Anesthesia: Moderate Sedation

## 2015-10-02 MED ORDER — MIDAZOLAM HCL 5 MG/5ML IJ SOLN
INTRAMUSCULAR | Status: AC | PRN
  Administered 2015-10-02: 4 mg via INTRAVENOUS

## 2015-10-02 MED ORDER — PHENYLEPHRINE HCL 0.25 % NA SOLN
1.0000 | Freq: Four times a day (QID) | NASAL | Status: DC | PRN
  Filled 2015-10-02: qty 15

## 2015-10-02 MED ORDER — FENTANYL CITRATE (PF) 100 MCG/2ML IJ SOLN
INTRAMUSCULAR | Status: AC
  Filled 2015-10-02: qty 4

## 2015-10-02 MED ORDER — SODIUM CHLORIDE 0.9 % IV SOLN
INTRAVENOUS | Status: DC
  Administered 2015-10-02: 12:00:00 via INTRAVENOUS

## 2015-10-02 MED ORDER — LIDOCAINE HCL 2 % EX GEL: 1.0000 "application " | Freq: Once | CUTANEOUS | Status: DC

## 2015-10-02 MED ORDER — BUTAMBEN-TETRACAINE-BENZOCAINE 2-2-14 % EX AERO
1.0000 | INHALATION_SPRAY | Freq: Once | CUTANEOUS | Status: DC
  Filled 2015-10-02: qty 20

## 2015-10-02 MED ORDER — SODIUM CHLORIDE FLUSH 0.9 % IV SOLN
INTRAVENOUS | Status: AC
Start: 2015-10-02 — End: 2015-10-03
  Filled 2015-10-02: qty 20

## 2015-10-02 MED ORDER — MIDAZOLAM HCL 5 MG/5ML IJ SOLN
INTRAMUSCULAR | Status: AC
  Filled 2015-10-02: qty 10

## 2015-10-02 MED ORDER — FENTANYL CITRATE (PF) 100 MCG/2ML IJ SOLN
INTRAMUSCULAR | Status: AC | PRN
  Administered 2015-10-02: 25 ug via INTRAVENOUS
  Administered 2015-10-02: 50 ug via INTRAVENOUS

## 2015-10-02 NOTE — H&P (Signed)
Pre procedure evaluation: Pt referred for lung mass found on CXR in early April, initially treated as PNA. Follow up CXR and CT chest revealed mass. Referred from Dr Genevive Bi. Pt is a former heavy smoker, quit in 2015. Former Building control surveyor. Reports DOE and weight loss. No hemoptysis. No bruising or bleeding reported  Filed Vitals:   10/02/15 1326 10/02/15 1332 10/02/15 1337 10/02/15 1345  BP: 175/91 169/91 156/91 134/80  Pulse: 92 95 94 92  Temp:      TempSrc:      Resp: '20 14 22 23  '$ SpO2: 99% 99% 95% 94%   Physical Exam  Constitutional: He is oriented to person, place, and time. He appears well-developed and well-nourished.  HENT:  Head: Normocephalic and atraumatic.  Eyes: EOM are normal. Pupils are equal, round, and reactive to light.  Cardiovascular: Normal rate and regular rhythm.   No murmur heard. Pulmonary/Chest: Effort normal. He has no wheezes.  Abdominal: Soft. Bowel sounds are normal.  Musculoskeletal: He exhibits no edema.  Lymphadenopathy:    He has no cervical adenopathy.  Neurological: He is alert and oriented to person, place, and time.  Skin: Skin is warm and dry.  Vitals reviewed.  BMP Latest Ref Rng 08/13/2013 08/12/2013 08/11/2013  Glucose 65-99 mg/dL 147(H) 140(H) 189(H)  BUN 7-18 mg/dL 30(H) 27(H) 30(H)  Creatinine 0.60-1.30 mg/dL 1.00 1.01 1.17  Sodium 136-145 mmol/L 138 138 136  Potassium 3.5-5.1 mmol/L 4.0 4.0 3.7  Chloride 98-107 mmol/L 105 106 103  CO2 21-32 mmol/L '29 28 27  '$ Calcium 8.5-10.1 mg/dL 8.0(L) 8.0(L) 8.0(L)    CBC    Component Value Date/Time   WBC 27.1* 08/15/2013 0648   RBC 4.91 08/15/2013 0648   HGB 13.7 08/15/2013 0648   HCT 41.1 08/15/2013 0648   PLT 371 08/15/2013 0648   MCV 84 08/15/2013 0648   MCH 28.0 08/15/2013 0648   MCHC 33.4 08/15/2013 0648   RDW 14.0 08/15/2013 0648   LYMPHSABS 2.2 08/15/2013 0648   MONOABS 0.1* 08/15/2013 0648   EOSABS 1.4* 08/15/2013 0648   BASOSABS 0.0 08/15/2013 0648   . IMPRESSION: Lung mass - likely  malignancy  PLAN: Bronchoscopy  Merton Border, MD PCCM service Mobile [phone] Pager [phone] 10/02/2015

## 2015-10-02 NOTE — Procedures (Signed)
Indication:   RUL mass  Premedication:   Fentanyl 75 mcg Midaz4 mg  Anesthesia: Topical to nose and throat 30 cc of 1% lidocaine used during the course of procedure  Procedure: After adequate sedation and anesthesia, the bronchoscope was introduced via the R naris and advanced into the posterior pharynx. Further anesthesia was obtained with 1% lidocaine and the scope was advanced into the trachea. Complete airway anesthesia was achieved with 1% lidocaine and a thorough airway examination was performed. This revealed the following findings:  Findings:  Upper airway - excess lymphoid tissue. Cords moved symmetrically Tracheobronchial anatomy - Normal configuration. RUL could not be examined Bronchial mucosa - mild chronic bronchitis changes. Tumor infiltration of anterior-medial aspect R mainstem bronchus mucosa Other - 100% obstruction of RUL bronchus with pedunculated tumor that extends into R mainstem broncus  Specimens:   Cytology brushings from RUL  Endobronchial biopsy from RUL  On site screening of specimens revealed severely atypical cells c/w malignancy  Complications: Mild bleeding resolved by time scope was removed   Post procedure evaluation:  The patient tolerated the procedure well with no major complications    I have discussed findings with Dr Genevive Bi. He does not appear to be a surgical candidate due to tumor location and the appearance of mucosal infiltration within centimeters of the main carina. Dr Genevive Bi is to refer to Rad Onc and Med Onc   Merton Border, MD PCCM service Mobile [phone] Pager [phone] 08/28/2015

## 2015-10-02 NOTE — Discharge Instructions (Signed)
Flexible Bronchoscopy, Care After Refer to this sheet in the next few weeks. These instructions provide you with information on caring for yourself after your procedure. Your health care provider may also give you more specific instructions. Your treatment has been planned according to current medical practices, but problems sometimes occur. Call your health care provider if you have any problems or questions after your procedure.  WHAT TO EXPECT AFTER THE PROCEDURE It is normal to have the following symptoms for 24-48 hours after the procedure:   Increased cough.  Low-grade fever.  Sore throat or hoarse voice.  Small streaks of blood in your thick spit (sputum) if tissue samples were taken (biopsy). HOME CARE INSTRUCTIONS   Do not eat or drink anything for 2 hours after your procedure. Your nose and throat were numbed by medicine. If you try to eat or drink before the medicine wears off, food or drink could go into your lungs or you could burn yourself. After the numbness is gone and your cough and gag reflexes have returned, you may eat soft food and drink liquids slowly.   The day after the procedure, you can go back to your normal diet.   You may resume normal activities.   Keep all follow-up visits as directed by your health care provider. It is important to keep all your appointments, especially if tissue samples were taken for testing (biopsy). SEEK IMMEDIATE MEDICAL CARE IF:   You have increasing shortness of breath.   You become light-headed or faint.   You have chest pain.   You have any new concerning symptoms.  You cough up more than a small amount of blood.  The amount of blood you cough up increases. MAKE SURE YOU:  Understand these instructions.  Will watch your condition.  Will get help right away if you are not doing well or get worse.   This information is not intended to replace advice given to you by your health care provider. Make sure you discuss  any questions you have with your health care provider.   Document Released: 11/12/2004 Document Revised: 05/16/2014 Document Reviewed: 12/28/2012 Elsevier Interactive Patient Education [employer].

## 2015-10-02 NOTE — Telephone Encounter (Signed)
Angie will send a referral for patient to be seen soon by an Materials engineer and a Facilities manager.

## 2015-10-06 LAB — SURGICAL PATHOLOGY

## 2015-10-06 LAB — CYTOLOGY - NON PAP

## 2015-10-08 ENCOUNTER — Telehealth: Payer: Self-pay | Admitting: Cardiothoracic Surgery

## 2015-10-08 NOTE — Telephone Encounter (Signed)
Patient's Friend Butch Penny Genice Rouge) has called and would like to be advised of the results from the bronchoscopy with biopsy that was done on 10/02/15 by pulmonology. This was asked to be done by Dr Genevive Bi. Patient's results are available at this time.   Patient was made aware that he has an appointment with Dr Donella Stade on 10/16/15 @ 9:30 am at [facility]. He was aware that Dr Genevive Bi did want him to be seen by oncology and radiation oncology. Both referrals were sent.

## 2015-10-08 NOTE — Telephone Encounter (Signed)
Called patient back to let him know that I had sent a message to Dr. Genevive Bi in reference to his questions. I also told him that once I received an answer from Dr. Genevive Bi, that I would return his call. Patient understood.

## 2015-10-12 ENCOUNTER — Encounter: Payer: Self-pay | Admitting: *Deleted

## 2015-10-12 ENCOUNTER — Inpatient Hospital Stay: Payer: Medicaid Other | Attending: Oncology | Admitting: Oncology

## 2015-10-12 ENCOUNTER — Ambulatory Visit
Admission: RE | Admit: 2015-10-12 | Discharge: 2015-10-12 | Disposition: A | Discharge status: Home / Self Care | Payer: Medicaid Other | Source: Ambulatory Visit | Attending: Radiation Oncology | Admitting: Radiation Oncology

## 2015-10-12 ENCOUNTER — Encounter: Payer: Self-pay | Admitting: Radiation Oncology

## 2015-10-12 VITALS — BP 164/83 | HR 88 | Temp 97.3°F | Resp 16 | Ht 74.41 in | Wt 167.8 lb

## 2015-10-12 DIAGNOSIS — I129 Hypertensive chronic kidney disease with stage 1 through stage 4 chronic kidney disease, or unspecified chronic kidney disease: Secondary | ICD-10-CM

## 2015-10-12 DIAGNOSIS — Z79899 Other long term (current) drug therapy: Secondary | ICD-10-CM | POA: Diagnosis not present

## 2015-10-12 DIAGNOSIS — N189 Chronic kidney disease, unspecified: Secondary | ICD-10-CM | POA: Insufficient documentation

## 2015-10-12 DIAGNOSIS — Z8701 Personal history of pneumonia (recurrent): Secondary | ICD-10-CM | POA: Insufficient documentation

## 2015-10-12 DIAGNOSIS — J449 Chronic obstructive pulmonary disease, unspecified: Secondary | ICD-10-CM | POA: Diagnosis not present

## 2015-10-12 DIAGNOSIS — D72829 Elevated white blood cell count, unspecified: Secondary | ICD-10-CM | POA: Insufficient documentation

## 2015-10-12 DIAGNOSIS — C3411 Malignant neoplasm of upper lobe, right bronchus or lung: Secondary | ICD-10-CM | POA: Diagnosis not present

## 2015-10-12 DIAGNOSIS — D473 Essential (hemorrhagic) thrombocythemia: Secondary | ICD-10-CM | POA: Diagnosis not present

## 2015-10-12 DIAGNOSIS — Z510 Encounter for antineoplastic radiation therapy: Secondary | ICD-10-CM | POA: Insufficient documentation

## 2015-10-12 DIAGNOSIS — I4891 Unspecified atrial fibrillation: Secondary | ICD-10-CM | POA: Diagnosis not present

## 2015-10-12 DIAGNOSIS — Z5111 Encounter for antineoplastic chemotherapy: Secondary | ICD-10-CM | POA: Insufficient documentation

## 2015-10-12 DIAGNOSIS — C3490 Malignant neoplasm of unspecified part of unspecified bronchus or lung: Secondary | ICD-10-CM

## 2015-10-12 DIAGNOSIS — I509 Heart failure, unspecified: Secondary | ICD-10-CM | POA: Insufficient documentation

## 2015-10-12 DIAGNOSIS — Z87891 Personal history of nicotine dependence: Secondary | ICD-10-CM | POA: Diagnosis not present

## 2015-10-12 NOTE — Progress Notes (Signed)
Patient had a cough for 5 months that was treated as pneumonia with no improvement so went for CT scan.  He is now have right side back pain and abdominal pain but he states he has a hernia.

## 2015-10-12 NOTE — Consult Note (Signed)
Except an outstanding is perfect of Radiation Oncology NEW PATIENT EVALUATION  Name: Sean Hall  MRN: [id number]  Date:   10/12/2015     DOB: [birth year]   This 65 y.o. 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  REFERRING PHYSICIAN: Nestor Lewandowsky, MD  CHIEF COMPLAINT: No chief complaint on file.   DIAGNOSIS: The encounter diagnosis was Malignant neoplasm of upper lobe of right lung ([place]).   PREVIOUS INVESTIGATIONS:  CT scans reviewed Clinical notes reviewed Pathology report reviewed  HPI: Patient is a 65 year old male who presented with increasing shortness of breath and cough with no hemoptysis. This is also associated with loss of appetite and a 30 pound weight loss over the past several months. Chest x-ray showed a right upper lobe mass. CT scan showed a right upper lobe large mass with direct mediastinal invasion and possible right mediastinal dominant nodal metastasis. Also has concurrent right hilar and probable subcarinal nodal involvement. Biopsy was positive for poorly differentiated carcinoma. Further classification is not possible as was performed by [facility] bronchoscopy. He has been seen by surgical oncology and based on the extensive nature of his lesion was not thought to be a surgical candidate. Patient shows no signs of superior vena, syndrome at this time. PET scan and head scan have been ordered. He is now seen for radiation oncology recommendation as well as by medical oncology.  PLANNED TREATMENT REGIMEN: Concurrent chemoradiation  PAST MEDICAL HISTORY:  has a past medical history of Atrial fibrillation ([place]); CHF (congestive heart failure) ([place]); Hypertension; Disease of lung; COPD (chronic obstructive pulmonary disease) ([place]); Pneumonia; and Chronic kidney disease.    PAST SURGICAL HISTORY:  Past Surgical History  Procedure Laterality Date  . Knee surgery Bilateral   .  Tonsillectomy      FAMILY HISTORY: family history includes Diabetes in his father; Hypertension in his father.  SOCIAL HISTORY:  reports that he quit smoking about 2 years ago. His smoking use included Cigarettes. He has a 100 pack-year smoking history. He does not have any smokeless tobacco history on file. He reports that he drinks alcohol. He reports that he does not use illicit drugs.  ALLERGIES: Aleve  MEDICATIONS:  Current Outpatient Prescriptions  Medication Sig Dispense Refill  . albuterol (PROAIR HFA) 108 (90 Base) MCG/ACT inhaler Inhale into the lungs.    Marland Kitchen aspirin EC 81 MG tablet Take by mouth. Reported on 10/12/2015    . benzonatate (TESSALON) 200 MG capsule Take by mouth.    . Multiple Vitamin (MULTI-VITAMINS) TABS Take by mouth.     No current facility-administered medications for this encounter.    ECOG PERFORMANCE STATUS:  1 - Symptomatic but completely ambulatory  REVIEW OF SYSTEMS: Except for the poor appetite continual chest pain Patient denies any weight loss, fatigue, weakness, fever, chills or night sweats. Patient denies any loss of vision, blurred vision. Patient denies any ringing  of the ears or hearing loss. No irregular heartbeat. Patient denies heart murmur or history of fainting. Patient denies any chest pain or pain radiating to her upper extremities. Patient denies any shortness of breath, difficulty breathing at night, cough or hemoptysis. Patient denies any swelling in the lower legs. Patient denies any nausea vomiting, vomiting of blood, or coffee ground material in the vomitus. Patient denies any stomach pain. Patient states has had normal bowel movements no significant constipation or diarrhea. Patient denies any dysuria, hematuria or significant  nocturia. Patient denies any problems walking, swelling in the joints or loss of balance. Patient denies any skin changes, loss of hair or loss of weight. Patient denies any excessive worrying or anxiety or  significant depression. Patient denies any problems with insomnia. Patient denies excessive thirst, polyuria, polydipsia. Patient denies any swollen glands, patient denies easy bruising or easy bleeding. Patient denies any recent infections, allergies or URI. Patient "s visual fields have not changed significantly in recent time.    PHYSICAL EXAM: There were no vitals taken for this visit. Well-developed male in NAD. No evidence of venous jugular distention in the neck or facial plethora. No collateral circulation is noted. Well-developed well-nourished patient in NAD. HEENT reveals PERLA, EOMI, discs not visualized.  Oral cavity is clear. No oral mucosal lesions are identified. Neck is clear without evidence of cervical or supraclavicular adenopathy. Lungs are clear to A&P. Cardiac examination is essentially unremarkable with regular rate and rhythm without murmur rub or thrill. Abdomen is benign with no organomegaly or masses noted. Motor sensory and DTR levels are equal and symmetric in the upper and lower extremities. Cranial nerves II through XII are grossly intact. Proprioception is intact. No peripheral adenopathy or edema is identified. No motor or sensory levels are noted. Crude visual fields are within normal range.  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 Would like to see complete metastatic workup and brain scan although need to proceed with radiation therapy as soon as possible to prevent superior vena cava syndrome. I would initially start with 4000 cGy to his chest and reevaluate for response and possible small field boost. Depending on further workup  will make further clinical decisions at that time when they are evaluated. Risks and benefits of treatment including fatigue increased cough possible esophagitis skin reaction alteration of blood counts all were discussed in detail with the patient and his family. I have personally set up and ordered CT simulation for later this week. I've discussed the case personally with medical oncology.  I would like to take this opportunity to thank you for allowing me to participate in the care of your patient.Armstead Peaks., MD

## 2015-10-13 NOTE — Progress Notes (Signed)
Previously met with patient and family at thoracic surgery consultation. Present today with patient and consult with radiation and medical oncology. Assisted in review of plan of care and will assist as needed with ongoing coordination of care with anticipation of treatment with concurrent chemotherapy and radiation next week.

## 2015-10-14 ENCOUNTER — Ambulatory Visit
Admission: RE | Admit: 2015-10-14 | Discharge: 2015-10-14 | Disposition: A | Discharge status: Home / Self Care | Payer: Medicaid Other | Source: Ambulatory Visit | Attending: Radiation Oncology | Admitting: Radiation Oncology

## 2015-10-14 DIAGNOSIS — C3411 Malignant neoplasm of upper lobe, right bronchus or lung: Secondary | ICD-10-CM | POA: Diagnosis not present

## 2015-10-14 DIAGNOSIS — Z510 Encounter for antineoplastic radiation therapy: Secondary | ICD-10-CM | POA: Diagnosis present

## 2015-10-14 NOTE — Telephone Encounter (Signed)
Patient has been seen by Dr Baruch Gouty and Dr Grayland Ormond on 10/12/15 for consultation.

## 2015-10-15 ENCOUNTER — Ambulatory Visit
Admission: RE | Admit: 2015-10-15 | Discharge: 2015-10-15 | Disposition: A | Discharge status: Home / Self Care | Payer: Medicaid Other | Source: Ambulatory Visit | Attending: Oncology | Admitting: Oncology

## 2015-10-15 DIAGNOSIS — R590 Localized enlarged lymph nodes: Secondary | ICD-10-CM | POA: Diagnosis not present

## 2015-10-15 DIAGNOSIS — C3411 Malignant neoplasm of upper lobe, right bronchus or lung: Secondary | ICD-10-CM | POA: Diagnosis not present

## 2015-10-15 DIAGNOSIS — C3490 Malignant neoplasm of unspecified part of unspecified bronchus or lung: Secondary | ICD-10-CM | POA: Diagnosis present

## 2015-10-15 DIAGNOSIS — Z510 Encounter for antineoplastic radiation therapy: Secondary | ICD-10-CM | POA: Diagnosis not present

## 2015-10-15 LAB — GLUCOSE, CAPILLARY: Glucose-Capillary: 124 mg/dL — ABNORMAL HIGH (ref 65–99)

## 2015-10-15 MED ORDER — FLUDEOXYGLUCOSE F - 18 (FDG) INJECTION
12.0000 | Freq: Once | INTRAVENOUS | Status: AC | PRN
  Administered 2015-10-15: 12.076 via INTRAVENOUS

## 2015-10-16 ENCOUNTER — Institutional Professional Consult (permissible substitution): Payer: Self-pay | Admitting: Radiation Oncology

## 2015-10-16 NOTE — Patient Instructions (Signed)

## 2015-10-17 DIAGNOSIS — C3411 Malignant neoplasm of upper lobe, right bronchus or lung: Secondary | ICD-10-CM | POA: Insufficient documentation

## 2015-10-17 MED ORDER — PROCHLORPERAZINE MALEATE 10 MG PO TABS: 10.0000 mg | ORAL_TABLET | Freq: Four times a day (QID) | ORAL | Status: DC | PRN

## 2015-10-17 MED ORDER — LIDOCAINE-PRILOCAINE 2.5-2.5 % EX CREA: TOPICAL_CREAM | CUTANEOUS | Status: DC

## 2015-10-17 MED ORDER — ONDANSETRON HCL 8 MG PO TABS: 8.0000 mg | ORAL_TABLET | Freq: Two times a day (BID) | ORAL | Status: DC | PRN

## 2015-10-17 NOTE — Progress Notes (Signed)
[place]  [phone] [phone]  ID: Lucy Antigua OB: 1951-02-22  MR#: [id number]  AOZ#:[id number]  Patient Care Team: No Pcp Per Patient as PCP - General (General Practice)  CHIEF COMPLAINT:  Chief Complaint  Patient presents with  . New Evaluation    lung cancer    INTERVAL HISTORY: Patient is a 65 year old male who noted to have a persistent cough without shortness of breath or hemoptysis for the past 5 months. When his symptoms did not resolve, a CT scan was ordered which revealed a large right upper lobe mass. Subsequent biopsy revealed a poorly differentiated carcinoma. Currently, he otherwise feels well and is asymptomatic. He has no neurologic complaints. He denies any fevers. He denies any weight loss. He denies any nausea, vomiting, consultation, or diarrhea. He has no urinary complaints. Patient otherwise feels well and offers no further specific complaints.  REVIEW OF SYSTEMS:   Review of Systems  Constitutional: Negative for fever, weight loss and malaise/fatigue.  Respiratory: Positive for cough. Negative for hemoptysis and shortness of breath.   Cardiovascular: Negative.  Negative for chest pain.  Gastrointestinal: Negative.   Genitourinary: Negative.   Musculoskeletal: Negative.   Neurological: Negative.  Negative for weakness.  Psychiatric/Behavioral: Negative.  The patient is not nervous/anxious and does not have insomnia.     As per HPI. Otherwise, a complete review of systems is negatve.  PAST MEDICAL HISTORY: Past Medical History  Diagnosis Date  . Atrial fibrillation ([place])   . CHF (congestive heart failure) ([place])   . Hypertension   . Disease of lung   . COPD (chronic obstructive pulmonary disease) ([place])   . Pneumonia   . Chronic kidney disease     PAST SURGICAL HISTORY: Past Surgical History  Procedure Laterality Date  . Knee surgery Bilateral   . Tonsillectomy      FAMILY HISTORY Family History    Problem Relation Age of Onset  . Diabetes Father   . Hypertension Father        ADVANCED DIRECTIVES:    HEALTH MAINTENANCE: Social History  Substance Use Topics  . Smoking status: Former Smoker -- 2.00 packs/day for 50 years    Types: Cigarettes    Quit date: 08/07/2013  . Smokeless tobacco: Not on file  . Alcohol Use: 0.0 oz/week    0 Standard drinks or equivalent per week     Comment: rarely     Colonoscopy:  PAP:  Bone density:  Lipid panel:  Allergies  Allergen Reactions  . Aleve [Naproxen Sodium] Hives, Shortness Of Breath and Swelling    Naproxin    Current Outpatient Prescriptions  Medication Sig Dispense Refill  . albuterol (PROAIR HFA) 108 (90 Base) MCG/ACT inhaler Inhale into the lungs.    Marland Kitchen aspirin EC 81 MG tablet Take by mouth. Reported on 10/12/2015    . benzonatate (TESSALON) 200 MG capsule Take by mouth.    . Multiple Vitamin (MULTI-VITAMINS) TABS Take by mouth.     No current facility-administered medications for this visit.    OBJECTIVE: Filed Vitals:   10/12/15 1430  BP: 164/83  Pulse: 88  Temp: 97.3 F (36.3 C)  Resp: 16     Body mass index is 21.3 kg/(m^2).    ECOG FS:0 - Asymptomatic  General: Well-developed, well-nourished, no acute distress. Eyes: Pink conjunctiva, anicteric sclera. HEENT: Normocephalic, moist mucous membranes, clear oropharnyx. Lungs: Clear to auscultation bilaterally. Heart: Regular rate and rhythm. No rubs, murmurs, or gallops. Abdomen: Soft, nontender, nondistended. No  organomegaly noted, normoactive bowel sounds. Musculoskeletal: No edema, cyanosis, or clubbing. Neuro: Alert, answering all questions appropriately. Cranial nerves grossly intact. Skin: No rashes or petechiae noted. Psych: Normal affect. Lymphatics: No cervical, calvicular, axillary or inguinal LAD.   LAB RESULTS:  Lab Results  Component Value Date   NA 138 08/13/2013   K 4.0 08/13/2013   CL 105 08/13/2013   CO2 29 08/13/2013   GLUCOSE  147* 08/13/2013   BUN 30* 08/13/2013   CREATININE 1.00 08/13/2013   CALCIUM 8.0* 08/13/2013   PROT 7.4 08/09/2013   ALBUMIN 3.2* 08/09/2013   AST 32 08/09/2013   ALT 18 08/09/2013   ALKPHOS 100 08/09/2013   BILITOT 2.0* 08/09/2013   GFRNONAA >60 08/13/2013   GFRAA >60 08/13/2013    Lab Results  Component Value Date   WBC 27.1* 08/15/2013   NEUTROABS 23.5* 08/15/2013   HGB 13.7 08/15/2013   HCT 41.1 08/15/2013   MCV 84 08/15/2013   PLT 371 08/15/2013     STUDIES: Ct Chest W Contrast  09/22/2015  CLINICAL DATA:  Lung mass on chest radiograph. Patient with congestion and chest discomfort. Improved with antibiotics. History of COPD. Chronic kidney disease. Congestive heart failure. Atrial fibrillation. One hundred pack-year smoking history; ex-smoker, quitting 08/07/2013. EXAM: CT CHEST WITH CONTRAST TECHNIQUE: Multidetector CT imaging of the chest was performed during intravenous contrast administration. CONTRAST:  18m OMNIPAQUE IOHEXOL 350 MG/ML SOLN COMPARISON:  Chest radiograph 09/07/2015. Report not available. CT of 08/09/2013 reviewed. FINDINGS: Mediastinum/Nodes: No supraclavicular adenopathy. Aortic and branch vessel atherosclerosis. Normal heart size, without pericardial effusion. Multivessel coronary artery atherosclerosis. No central pulmonary embolism, on this non-dedicated study. 12 mm right paratracheal node maintains its fatty hilum but is newly enlarged since the prior exam, including on image 22/series 2. Subcarinal node is upper normal to mildly enlarged, 1.4 cm image 30/series 2. 1.2 cm on the remote chest CT. Direct extension of right lung mass in the right side of the mediastinum versus adjacent adenopathy. Example at 3.9 cm on image 29/series 2. No left hilar adenopathy. Lungs/Pleura: Small right pleural effusion. Probable secretions in the trachea and right mainstem bronchus. Moderate centrilobular emphysema.  Moderate paraseptal emphysema. Right apical and central  upper lobe lung mass. On the order of 10.1 x 9.5 cm including on image 16/series 2. 9.2 cm craniocaudal. Surrounding more peripheral right upper lobe septal thickening and ground-glass opacity. Upper abdomen: Hypo attenuating right liver lobe 11 mm lesion image 58/series 2. Nonspecific but present on 08/09/2013, favoring a benign etiology. Normal imaged portions of the spleen, stomach, pancreas, gallbladder, adrenal glands, right kidney. Too small to characterize left renal lesion. Abdominal aortic atherosclerosis. Musculoskeletal: No gross chest wall invasion or rib destruction. IMPRESSION: 1. right apical/upper lobe primary bronchogenic carcinoma. Direct mediastinal invasion versus adjacent right mediastinal dominant nodal metastasis. Concurrent right hilar and probable subcarinal nodal metastasis. 2. Small right pleural effusion. 3. More peripheral right upper lobe septal thickening which could represent postobstructive pneumonitis and/or lymphangitic tumor spread. 4.  Atherosclerosis, including within the coronary arteries. These results will be called to the ordering clinician or representative by the Radiologist Assistant, and communication documented in the PACS or zVision Dashboard. Electronically Signed   By: KAbigail MiyamotoM.D.   On: 09/22/2015 11:59   Nm Pet Image Initial (pi) Skull Base To Thigh  10/15/2015  CLINICAL DATA:  Initial treatment strategy for non-small cell lung cancer. EXAM: NUCLEAR MEDICINE PET SKULL BASE TO THIGH TECHNIQUE: 12.07 mCi F-18 FDG was injected intravenously. Full-ring  PET imaging was performed from the skull base to thigh after the radiotracer. CT data was obtained and used for attenuation correction and anatomic localization. FASTING BLOOD GLUCOSE:  Value: 124 mg/dl COMPARISON:  Chest CT 09/22/2015 FINDINGS: NECK No hypermetabolic lymph nodes in the neck. CHEST Large right upper lobe lung mass is markedly hypermetabolic with SUV max of 19. There is right hilar and mediastinal  lymphadenopathy which is also hypermetabolic. A right epicardial lymph image number 107 measures 21 mm and has SUV max of 4.9. Sub carinal lymph node on image 1 to measures 13 mm and SUV max is 2.85. No contralateral mediastinal or hilar adenopathy. Small subpleural nodular density on image 125 is not hypermetabolic and likely subpleural atelectasis. No definite metastatic pulmonary nodules in the left lung. Stable underlying emphysematous changes and pulmonary scarring. Small right-sided pleural effusion but no hypermetabolism or pleural nodularity. Stable three-vessel coronary artery calcifications. ABDOMEN/PELVIS No abnormal hypermetabolic activity within the liver, pancreas, adrenal glands, or spleen. No hypermetabolic lymph nodes in the abdomen or pelvis. The low-attenuation liver lesions seen on the prior CT scan is not hypermetabolic and somewhat photopenic. This is likely a benign cyst. No adrenal gland metastasis. Incidental findings include age advanced aortic and branch vessel atherosclerotic calcifications and bilateral inguinal hernias containing bowel. SKELETON No focal hypermetabolic activity to suggest skeletal metastasis. IMPRESSION: 1. Large right upper lobe lung mass consistent with known neoplasm. Mediastinal and hilar lymphadenopathy. 2. No findings for metastatic disease involving the lungs, neck, abdomen/pelvis or osseous structures. Electronically Signed   By: Marijo Sanes M.D.   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  PLAN:    1. Lung cancer: CT and PET results reviewed independently and reported as above. Pathology consistent with poorly differentiated carcinoma, but there is not enough tissue for additional molecular testing. MRI the brain is currently pending. Patient will benefit from concurrent chemotherapy and XRT and had consultation with radiation oncology today. Will give concurrent weekly  carboplatinum and Taxol along with daily XRT. At the conclusion of his XRT, patient will likely benefit from 2 consolidation doses of chemotherapy. Return to clinic on October 21, 2015 to initiate cycle 1 of weekly carboplatinum and Taxol.  Approximately 45 minutes was spent in discussion of which greater than 50% was consultation.  Patient expressed understanding and was in agreement with this plan. He also understands that He can call clinic at any time with any questions, concerns, or complaints.   Lung cancer [facility])   Staging form: Lung, AJCC 7th Edition     Clinical stage from 10/17/2015: Stage IIIB (T4, N2, M0) - Signed by Lloyd Huger, MD on 10/17/2015   Lloyd Huger, MD   10/17/2015 2:21 PM

## 2015-10-19 ENCOUNTER — Ambulatory Visit
Admission: RE | Admit: 2015-10-19 | Discharge: 2015-10-19 | Disposition: A | Discharge status: Home / Self Care | Payer: Medicaid Other | Source: Ambulatory Visit | Attending: Oncology | Admitting: Oncology

## 2015-10-19 DIAGNOSIS — C3490 Malignant neoplasm of unspecified part of unspecified bronchus or lung: Secondary | ICD-10-CM | POA: Diagnosis not present

## 2015-10-19 DIAGNOSIS — Z8673 Personal history of transient ischemic attack (TIA), and cerebral infarction without residual deficits: Secondary | ICD-10-CM | POA: Diagnosis not present

## 2015-10-19 DIAGNOSIS — I6782 Cerebral ischemia: Secondary | ICD-10-CM | POA: Diagnosis not present

## 2015-10-19 LAB — POCT I-STAT CREATININE: CREATININE: 0.6 mg/dL — AB (ref 0.61–1.24)

## 2015-10-19 MED ORDER — GADOBENATE DIMEGLUMINE 529 MG/ML IV SOLN
15.0000 mL | Freq: Once | INTRAVENOUS | Status: AC | PRN
  Administered 2015-10-19: 15 mL via INTRAVENOUS

## 2015-10-20 ENCOUNTER — Other Ambulatory Visit: Payer: Self-pay | Admitting: *Deleted

## 2015-10-20 ENCOUNTER — Inpatient Hospital Stay: Payer: Medicaid Other

## 2015-10-20 DIAGNOSIS — C3411 Malignant neoplasm of upper lobe, right bronchus or lung: Secondary | ICD-10-CM

## 2015-10-20 MED ORDER — PROCHLORPERAZINE MALEATE 10 MG PO TABS: 10.0000 mg | ORAL_TABLET | Freq: Four times a day (QID) | ORAL | Status: AC | PRN

## 2015-10-20 MED ORDER — ONDANSETRON HCL 8 MG PO TABS: 8.0000 mg | ORAL_TABLET | Freq: Two times a day (BID) | ORAL | Status: DC | PRN

## 2015-10-21 ENCOUNTER — Ambulatory Visit
Admission: RE | Admit: 2015-10-21 | Discharge: 2015-10-21 | Disposition: A | Discharge status: Home / Self Care | Payer: Medicaid Other | Source: Ambulatory Visit | Attending: Radiation Oncology | Admitting: Radiation Oncology

## 2015-10-21 ENCOUNTER — Inpatient Hospital Stay: Payer: Medicaid Other

## 2015-10-21 ENCOUNTER — Telehealth: Payer: Self-pay | Admitting: Cardiothoracic Surgery

## 2015-10-21 ENCOUNTER — Inpatient Hospital Stay (HOSPITAL_BASED_OUTPATIENT_CLINIC_OR_DEPARTMENT_OTHER): Payer: Medicaid Other | Admitting: Oncology

## 2015-10-21 VITALS — BP 155/75 | HR 83 | Resp 20

## 2015-10-21 VITALS — BP 130/75 | HR 83 | Temp 95.0°F | Resp 18 | Wt 166.0 lb

## 2015-10-21 DIAGNOSIS — I129 Hypertensive chronic kidney disease with stage 1 through stage 4 chronic kidney disease, or unspecified chronic kidney disease: Secondary | ICD-10-CM

## 2015-10-21 DIAGNOSIS — Z87891 Personal history of nicotine dependence: Secondary | ICD-10-CM

## 2015-10-21 DIAGNOSIS — N189 Chronic kidney disease, unspecified: Secondary | ICD-10-CM

## 2015-10-21 DIAGNOSIS — C3411 Malignant neoplasm of upper lobe, right bronchus or lung: Secondary | ICD-10-CM

## 2015-10-21 DIAGNOSIS — Z5111 Encounter for antineoplastic chemotherapy: Secondary | ICD-10-CM | POA: Diagnosis not present

## 2015-10-21 DIAGNOSIS — I4891 Unspecified atrial fibrillation: Secondary | ICD-10-CM

## 2015-10-21 DIAGNOSIS — J449 Chronic obstructive pulmonary disease, unspecified: Secondary | ICD-10-CM

## 2015-10-21 DIAGNOSIS — I509 Heart failure, unspecified: Secondary | ICD-10-CM

## 2015-10-21 DIAGNOSIS — Z8701 Personal history of pneumonia (recurrent): Secondary | ICD-10-CM

## 2015-10-21 DIAGNOSIS — Z79899 Other long term (current) drug therapy: Secondary | ICD-10-CM

## 2015-10-21 DIAGNOSIS — Z510 Encounter for antineoplastic radiation therapy: Secondary | ICD-10-CM | POA: Diagnosis not present

## 2015-10-21 LAB — COMPREHENSIVE METABOLIC PANEL
ALBUMIN: 2.7 g/dL — AB (ref 3.5–5.0)
ALK PHOS: 147 U/L — AB (ref 38–126)
ALT: 14 U/L — AB (ref 17–63)
AST: 14 U/L — AB (ref 15–41)
Anion gap: 7 (ref 5–15)
BILIRUBIN TOTAL: 0.4 mg/dL (ref 0.3–1.2)
BUN: 10 mg/dL (ref 6–20)
CHLORIDE: 96 mmol/L — AB (ref 101–111)
CO2: 28 mmol/L (ref 22–32)
CREATININE: 0.78 mg/dL (ref 0.61–1.24)
Calcium: 8.9 mg/dL (ref 8.9–10.3)
GFR calc Af Amer: >60 mL/min (ref >60)
GFR calc non Af Amer: >60 mL/min (ref >60)
Glucose, Bld: 133 mg/dL — ABNORMAL HIGH (ref 65–99)
Potassium: 4 mmol/L (ref 3.5–5.1)
Sodium: 131 mmol/L — ABNORMAL LOW (ref 135–145)
Total Protein: 8.7 g/dL — ABNORMAL HIGH (ref 6.5–8.1)

## 2015-10-21 LAB — CBC WITH DIFFERENTIAL/PLATELET
BASOS ABS: 0.2 10*3/uL — AB (ref 0–0.1)
BASOS PCT: 1 %
Eosinophils Absolute: 0.2 10*3/uL (ref 0–0.7)
Eosinophils Relative: 1 %
HEMATOCRIT: 31.1 % — AB (ref 40.0–52.0)
HEMOGLOBIN: 10.3 g/dL — AB (ref 13.0–18.0)
Lymphocytes Relative: 19 %
Lymphs Abs: 3 10*3/uL (ref 1.0–3.6)
MCH: 23.2 pg — ABNORMAL LOW (ref 26.0–34.0)
MCHC: 33.1 g/dL (ref 32.0–36.0)
MCV: 70 fL — ABNORMAL LOW (ref 80.0–100.0)
Monocytes Absolute: 1.2 10*3/uL — ABNORMAL HIGH (ref 0.2–1.0)
Monocytes Relative: 8 %
NEUTROS ABS: 11.2 10*3/uL — AB (ref 1.4–6.5)
NEUTROS PCT: 71 %
Platelets: 621 10*3/uL — ABNORMAL HIGH (ref 150–440)
RBC: 4.44 MIL/uL (ref 4.40–5.90)
RDW: 17.9 % — ABNORMAL HIGH (ref 11.5–14.5)
WBC: 15.7 10*3/uL — ABNORMAL HIGH (ref 3.8–10.6)

## 2015-10-21 MED ORDER — SODIUM CHLORIDE 0.9 % IV SOLN
10.0000 mg | Freq: Once | INTRAVENOUS | Status: AC
  Administered 2015-10-21: 10 mg via INTRAVENOUS
  Filled 2015-10-21: qty 1

## 2015-10-21 MED ORDER — PALONOSETRON HCL INJECTION 0.25 MG/5ML
0.2500 mg | Freq: Once | INTRAVENOUS | Status: AC
  Administered 2015-10-21: 0.25 mg via INTRAVENOUS
  Filled 2015-10-21: qty 5

## 2015-10-21 MED ORDER — FAMOTIDINE IN NACL 20-0.9 MG/50ML-% IV SOLN
20.0000 mg | Freq: Once | INTRAVENOUS | Status: AC
  Administered 2015-10-21: 20 mg via INTRAVENOUS
  Filled 2015-10-21: qty 50

## 2015-10-21 MED ORDER — PACLITAXEL CHEMO INJECTION 300 MG/50ML
45.0000 mg/m2 | Freq: Once | INTRAVENOUS | Status: AC
  Administered 2015-10-21: 90 mg via INTRAVENOUS
  Filled 2015-10-21: qty 15

## 2015-10-21 MED ORDER — SODIUM CHLORIDE 0.9 % IV SOLN
Freq: Once | INTRAVENOUS | Status: AC
  Administered 2015-10-21: 10:00:00 via INTRAVENOUS
  Filled 2015-10-21: qty 1000

## 2015-10-21 MED ORDER — DIPHENHYDRAMINE HCL 50 MG/ML IJ SOLN
25.0000 mg | Freq: Once | INTRAMUSCULAR | Status: AC
  Administered 2015-10-21: 25 mg via INTRAVENOUS
  Filled 2015-10-21: qty 1

## 2015-10-21 MED ORDER — SODIUM CHLORIDE 0.9 % IV SOLN
250.8000 mg | Freq: Once | INTRAVENOUS | Status: AC
  Administered 2015-10-21: 250 mg via INTRAVENOUS
  Filled 2015-10-21: qty 25

## 2015-10-21 NOTE — Telephone Encounter (Signed)
I have called patient per referral to make an appointment with Dr Genevive Bi for possible [facility] No answer. I have left a message for patient to call back to make appointment. I will call back if i have not received a call from patient by the end of the business day.

## 2015-10-21 NOTE — Progress Notes (Signed)
States is feeling anxious about treatment today. Wants to talk to [facility] about getting PAC placed. Recently having chest congestion with productive cough and shortness of breath. Needs refill of tessalon perles sent to [facility]. Also, requests prescription for EMLA cream if gets PAC placed.

## 2015-10-21 NOTE — Progress Notes (Unsigned)
PSN met with patient today.  Patient is unable to work due to diagnosis.  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  PSN will assist patient with utilities, food, etc from the [place] [employer] [place] funds.  PSN explored Social Security Disability with patient.

## 2015-10-22 ENCOUNTER — Ambulatory Visit
Admission: RE | Admit: 2015-10-22 | Discharge: 2015-10-22 | Disposition: A | Discharge status: Home / Self Care | Payer: Medicaid Other | Source: Ambulatory Visit | Attending: Radiation Oncology | Admitting: Radiation Oncology

## 2015-10-22 DIAGNOSIS — Z510 Encounter for antineoplastic radiation therapy: Secondary | ICD-10-CM | POA: Diagnosis not present

## 2015-10-23 ENCOUNTER — Ambulatory Visit
Admission: RE | Admit: 2015-10-23 | Discharge: 2015-10-23 | Disposition: A | Discharge status: Home / Self Care | Payer: Medicaid Other | Source: Ambulatory Visit | Attending: Radiation Oncology | Admitting: Radiation Oncology

## 2015-10-23 ENCOUNTER — Ambulatory Visit: Payer: Medicaid Other

## 2015-10-23 DIAGNOSIS — Z510 Encounter for antineoplastic radiation therapy: Secondary | ICD-10-CM | POA: Diagnosis not present

## 2015-10-23 NOTE — Telephone Encounter (Signed)
Appointment has been made for 10/27/15 with Dr Genevive Bi.

## 2015-10-26 ENCOUNTER — Ambulatory Visit
Admission: RE | Admit: 2015-10-26 | Discharge: 2015-10-26 | Disposition: A | Discharge status: Home / Self Care | Payer: Medicaid Other | Source: Ambulatory Visit | Attending: Radiation Oncology | Admitting: Radiation Oncology

## 2015-10-26 ENCOUNTER — Ambulatory Visit: Payer: Medicaid Other

## 2015-10-26 DIAGNOSIS — Z510 Encounter for antineoplastic radiation therapy: Secondary | ICD-10-CM | POA: Diagnosis not present

## 2015-10-27 ENCOUNTER — Ambulatory Visit
Admission: RE | Admit: 2015-10-27 | Discharge: 2015-10-27 | Disposition: A | Discharge status: Home / Self Care | Payer: Medicaid Other | Source: Ambulatory Visit | Attending: Cardiothoracic Surgery | Admitting: Cardiothoracic Surgery

## 2015-10-27 ENCOUNTER — Encounter: Payer: Self-pay | Admitting: Cardiothoracic Surgery

## 2015-10-27 ENCOUNTER — Telehealth: Payer: Self-pay | Admitting: Cardiothoracic Surgery

## 2015-10-27 ENCOUNTER — Ambulatory Visit (INDEPENDENT_AMBULATORY_CARE_PROVIDER_SITE_OTHER): Payer: Self-pay | Admitting: Cardiothoracic Surgery

## 2015-10-27 ENCOUNTER — Other Ambulatory Visit: Payer: Self-pay

## 2015-10-27 ENCOUNTER — Ambulatory Visit: Payer: Medicaid Other

## 2015-10-27 ENCOUNTER — Ambulatory Visit
Admission: RE | Admit: 2015-10-27 | Discharge: 2015-10-27 | Disposition: A | Discharge status: Home / Self Care | Payer: Medicaid Other | Source: Ambulatory Visit | Attending: Radiation Oncology | Admitting: Radiation Oncology

## 2015-10-27 VITALS — BP 156/88 | HR 88 | Temp 97.8°F | Resp 24 | Ht 74.0 in | Wt 163.6 lb

## 2015-10-27 DIAGNOSIS — C3490 Malignant neoplasm of unspecified part of unspecified bronchus or lung: Secondary | ICD-10-CM | POA: Insufficient documentation

## 2015-10-27 DIAGNOSIS — Z01810 Encounter for preprocedural cardiovascular examination: Secondary | ICD-10-CM | POA: Insufficient documentation

## 2015-10-27 DIAGNOSIS — C3411 Malignant neoplasm of upper lobe, right bronchus or lung: Secondary | ICD-10-CM

## 2015-10-27 DIAGNOSIS — Z01812 Encounter for preprocedural laboratory examination: Secondary | ICD-10-CM | POA: Insufficient documentation

## 2015-10-27 DIAGNOSIS — Z510 Encounter for antineoplastic radiation therapy: Secondary | ICD-10-CM | POA: Diagnosis not present

## 2015-10-27 HISTORY — DX: Personal history of irradiation: Z92.3

## 2015-10-27 HISTORY — DX: Cardiac arrhythmia, unspecified: I49.9

## 2015-10-27 HISTORY — DX: Headache: R51

## 2015-10-27 HISTORY — DX: Personal history of antineoplastic chemotherapy: Z92.21

## 2015-10-27 HISTORY — DX: Unspecified osteoarthritis, unspecified site: M19.90

## 2015-10-27 HISTORY — DX: Headache, unspecified: R51.9

## 2015-10-27 LAB — COMPREHENSIVE METABOLIC PANEL
ALBUMIN: 2.7 g/dL — AB (ref 3.5–5.0)
ALK PHOS: 152 U/L — AB (ref 38–126)
ALT: 18 U/L (ref 17–63)
AST: 16 U/L (ref 15–41)
Anion gap: 9 (ref 5–15)
BILIRUBIN TOTAL: 0.1 mg/dL — AB (ref 0.3–1.2)
BUN: 17 mg/dL (ref 6–20)
CALCIUM: 9 mg/dL (ref 8.9–10.3)
CO2: 27 mmol/L (ref 22–32)
CREATININE: 0.8 mg/dL (ref 0.61–1.24)
Chloride: 96 mmol/L — ABNORMAL LOW (ref 101–111)
GFR calc Af Amer: >60 mL/min (ref >60)
GLUCOSE: 114 mg/dL — AB (ref 65–99)
POTASSIUM: 4.4 mmol/L (ref 3.5–5.1)
Sodium: 132 mmol/L — ABNORMAL LOW (ref 135–145)
TOTAL PROTEIN: 8.5 g/dL — AB (ref 6.5–8.1)

## 2015-10-27 LAB — CBC
HEMATOCRIT: 32 % — AB (ref 40.0–52.0)
HEMOGLOBIN: 10.5 g/dL — AB (ref 13.0–18.0)
MCH: 23.5 pg — AB (ref 26.0–34.0)
MCHC: 32.7 g/dL (ref 32.0–36.0)
MCV: 71.8 fL — AB (ref 80.0–100.0)
Platelets: 509 10*3/uL — ABNORMAL HIGH (ref 150–440)
RBC: 4.46 MIL/uL (ref 4.40–5.90)
RDW: 18.2 % — ABNORMAL HIGH (ref 11.5–14.5)
WBC: 13 10*3/uL — AB (ref 3.8–10.6)

## 2015-10-27 LAB — PROTIME-INR
INR: 1.32
Prothrombin Time: 16.5 seconds — ABNORMAL HIGH (ref 11.4–15.0)

## 2015-10-27 LAB — APTT: aPTT: 33 seconds (ref 24–36)

## 2015-10-27 NOTE — Patient Instructions (Signed)
  Your procedure is scheduled on: October 29, 2015 (Thursday) Report to Day Surgery. SECOND FLOOR  MEDICAL MALL To find out your arrival time please call [phone] between 1PM - 3PM on October 28, 2015 (Wednesday).  Remember: Instructions that are not followed completely may result in serious medical risk, up to and including death, or upon the discretion of your surgeon and anesthesiologist your surgery may need to be rescheduled.    __x__ 1. Do not eat food or drink liquids after midnight. No gum chewing or hard candies.     __x__ 2. No Alcohol for 24 hours before or after surgery.   __x__ 3. Do Not Smoke For 24 Hours Prior to Your Surgery.   ____ 4. Bring all medications with you on the day of surgery if instructed.    __x_ 5. Notify your doctor if there is any change in your medical condition     (cold, fever, infections).       Do not wear jewelry, make-up, hairpins, clips or nail polish.  Do not wear lotions, powders, or perfumes. You may wear deodorant.  Do not shave 48 hours prior to surgery. Men may shave face and neck.  Do not bring valuables to the hospital.    [facility] is not responsible for any belongings or valuables.               Contacts, dentures or bridgework may not be worn into surgery.  Leave your suitcase in the car. After surgery it may be brought to your room.  For patients admitted to the hospital, discharge time is determined by your                treatment team.   Patients discharged the day of surgery will not be allowed to drive home.   Please read over the following fact sheets that you were given:   Surgical Site Infection Prevention   ____ Take these medicines the morning of surgery with A SIP OF WATER:    1.   2.   3.   4.  5.  6.  ____ Fleet Enema (as directed)   ____ Use CHG Soap as directed  __x__ Use inhalers on the day of surgery (USE ALBUTEROL INHALER AND [place] )  ____ Stop metformin 2 days prior  to surgery    ____ Take 1/2 of usual insulin dose the night before surgery and none on the morning of surgery.   _x___ Stop Coumadin/Plavix/aspirin on (N/A)  _x___ Stop Anti-inflammatories on (NO NSAIDS) TYLENOL OK TO TAKE FOR HEADACHE IF NEEDED   ____ Stop supplements until after surgery.    ____ Bring C-Pap to the hospital.

## 2015-10-27 NOTE — Patient Instructions (Addendum)
Please refer to your [facility]) Pre-care sheet for more information regarding your surgery.  Your Pre-admission appointment is right after your radiation treatment today and they will draw your Labs, Chest X-ray, and EKG if needed for the surgery.

## 2015-10-27 NOTE — Progress Notes (Signed)
Sean Hall Inpatient Post-Op Note  Patient ID: Sean Hall, male   DOB: [birth year], 65 y.o.   MRN: [id number]  HISTORY: This gentleman is a 65 year old male with a recent diagnosis of adenocarcinoma the lung. He is been seen by Dr. Grayland Ormond and Dr. Donella Stade and is begun on chemotherapy and radiation therapy for his unresectable lung cancer. The patient comes in today to discuss Port-A-Cath placement. He states he does get short of breath with exertion. He is not had any fevers or chills. He tolerated his first round of chemotherapy well and is scheduled to get a second round later this week. In addition he said for 5 treatments of radiation therapy.   Filed Vitals:   10/27/15 0942  BP: 156/88  Pulse: 88  Temp: 97.8 F (36.6 C)  Resp: 24     EXAM: Resp: Lungs are clear bilaterally.  No respiratory distress, normal effort. Heart:  Regular without murmurs Abd:  Abdomen is soft, non distended and non tender. No masses are palpable.  There is no rebound and no guarding.  Neurological: Alert and oriented to person, place, and time. Coordination normal.  Skin: Skin is warm and dry. No rash noted. No diaphoretic. No erythema. No pallor.  Psychiatric: Normal mood and affect. Normal behavior. Judgment and thought content normal.    ASSESSMENT: I have independently reviewed his chest CT. I discussed his care with Dr. Grayland Ormond. 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 Whether this is dynamic or fixed is unclear. This CT scan was from approximately one month ago.  The patient would be interested in having a Port-A-Cath placed. I explained to him the indications and risks of Port-A-Cath placement. Risks of bleeding, infection, pneumothorax were all discussed. He will have his preoperative labs drawn today. We will plan on Port-A-Cath placement later this week.   PLAN:   We will plan on Port-A-Cath placement later this week. He will undergo  ultrasound intraoperatively of his accessible subclavian and internal jugular veins. He is aware of the risks.    Nestor Lewandowsky, MD

## 2015-10-27 NOTE — Progress Notes (Signed)
Sean Hall  [phone]) [phone] Fax:(336) [phone]  ID: Lucy Antigua OB: 03-12-1951  MR#: [id number]  SLH#:[id number]  Patient Care Team: No Pcp Per Patient as PCP - General ([place]) Nestor Lewandowsky, MD as Consulting Physician (Cardiothoracic Surgery)  CHIEF COMPLAINT:  Chief Complaint  Patient presents with  . Lung Cancer    INTERVAL HISTORY: Patient Returns to clinic today for further evaluation and to initiate cycle 1 of weekly carboplatinum and Taxol concurrently with daily XRT. He is anxious about beginning treatments, but otherwise feels well. He has no neurologic complaints. He denies any fevers. He denies any chest pain, shortness of breath, or hemoptysis. He has a chronic cough that is unchanged.  He denies any weight loss. He denies any nausea, vomiting, consultation, or diarrhea. He has no urinary complaints. Patient otherwise feels well and offers no further specific complaints.  REVIEW OF SYSTEMS:   Review of Systems  Constitutional: Negative for fever, weight loss and malaise/fatigue.  Respiratory: Positive for cough. Negative for hemoptysis and shortness of breath.   Cardiovascular: Negative.  Negative for chest pain.  Gastrointestinal: Negative.   Genitourinary: Negative.   Musculoskeletal: Negative.   Neurological: Negative.  Negative for weakness.  Psychiatric/Behavioral: The patient is nervous/anxious. The patient does not have insomnia.     As per HPI. Otherwise, a complete review of systems is negatve.  PAST MEDICAL HISTORY: Past Medical History  Diagnosis Date  . Atrial fibrillation ([place])   . CHF (congestive heart failure) ([place])   . Hypertension   . Disease of lung   . COPD (chronic obstructive pulmonary disease) ([place])   . Pneumonia   . Chronic kidney disease   . Cancer ([place]) 2017    Lung  . Dysrhythmia   . Headache   . Arthritis   . History of chemotherapy   . History of radiation therapy     PAST SURGICAL  HISTORY: Past Surgical History  Procedure Laterality Date  . Knee surgery Bilateral   . Tonsillectomy      FAMILY HISTORY Family History  Problem Relation Age of Onset  . Diabetes Father   . Hypertension Father        ADVANCED DIRECTIVES:    HEALTH MAINTENANCE: Social History  Substance Use Topics  . Smoking status: Former Smoker -- 2.00 packs/day for 50 years    Types: Cigarettes    Quit date: 08/07/2013  . Smokeless tobacco: Never Used  . Alcohol Use: 0.0 oz/week    0 Standard drinks or equivalent per week     Comment: rarely     Colonoscopy:  PAP:  Bone density:  Lipid panel:  Allergies  Allergen Reactions  . Aleve [Naproxen Sodium] Hives, Shortness Of Breath and Swelling    Naproxin    Current Outpatient Prescriptions  Medication Sig Dispense Refill  . albuterol (PROAIR HFA) 108 (90 Base) MCG/ACT inhaler Inhale 1-2 puffs into the lungs every 4 (four) hours as needed.     . lidocaine-prilocaine (EMLA) cream Apply to affected area once (Patient not taking: Reported on 10/27/2015) 30 g 3  . Multiple Vitamin (MULTI-VITAMINS) TABS Take 1 tablet by mouth daily.     . ondansetron (ZOFRAN) 8 MG tablet Take 1 tablet (8 mg total) by mouth 2 (two) times daily as needed for refractory nausea / vomiting. Start on day 3 after chemo. 30 tablet 1  . prochlorperazine (COMPAZINE) 10 MG tablet Take 1 tablet (10 mg total) by mouth every 6 (six) hours as needed (Nausea or  vomiting). 30 tablet 1   No current facility-administered medications for this visit.    OBJECTIVE: Filed Vitals:   10/21/15 0858  BP: 130/75  Pulse: 83  Temp: 95 F (35 C)  Resp: 18     Body mass index is 21.08 kg/(m^2).    ECOG FS:0 - Asymptomatic  General: Well-developed, well-nourished, no acute distress. Eyes: Pink conjunctiva, anicteric sclera. Lungs: Clear to auscultation bilaterally. Heart: Regular rate and rhythm. No rubs, murmurs, or gallops. Abdomen: Soft, nontender, nondistended. No  organomegaly noted, normoactive bowel sounds. Musculoskeletal: No edema, cyanosis, or clubbing. Neuro: Alert, answering all questions appropriately. Cranial nerves grossly intact. Skin: No rashes or petechiae noted. Psych: Normal affect.   LAB RESULTS:  Lab Results  Component Value Date   NA 131* 10/21/2015   K 4.0 10/21/2015   CL 96* 10/21/2015   CO2 28 10/21/2015   GLUCOSE 133* 10/21/2015   BUN 10 10/21/2015   CREATININE 0.78 10/21/2015   CALCIUM 8.9 10/21/2015   PROT 8.7* 10/21/2015   ALBUMIN 2.7* 10/21/2015   AST 14* 10/21/2015   ALT 14* 10/21/2015   ALKPHOS 147* 10/21/2015   BILITOT 0.4 10/21/2015   GFRNONAA >60 10/21/2015   GFRAA >60 10/21/2015    Lab Results  Component Value Date   WBC 15.7* 10/21/2015   NEUTROABS 11.2* 10/21/2015   HGB 10.3* 10/21/2015   HCT 31.1* 10/21/2015   MCV 70.0* 10/21/2015   PLT 621* 10/21/2015     STUDIES: Mr Jeri Cos Wo Contrast  2015/11/07  CLINICAL DATA:  Recently diagnosed non-small cell lung cancer involving the right upper lobe. Staging. Occasional headaches. EXAM: MRI HEAD WITHOUT AND WITH CONTRAST TECHNIQUE: Multiplanar, multiecho pulse sequences of the brain and surrounding structures were obtained without and with intravenous contrast. CONTRAST:  37m MULTIHANCE GADOBENATE DIMEGLUMINE 529 MG/ML IV SOLN COMPARISON:  None. FINDINGS: There is no evidence of acute infarct, intracranial hemorrhage, mass, midline shift, or extra-axial fluid collection. There are small, chronic cortical and subcortical infarcts in the left frontal lobe. Small foci of T2 hyperintensity elsewhere involving the white matter of the left greater than right cerebral hemispheres are nonspecific but compatible with chronic small vessel ischemic disease, mildly advanced for age. There is slight ex vacuo enlargement of the frontal horn of the left lateral ventricle. No abnormal enhancement is identified. Orbits are unremarkable. Paranasal sinuses and mastoid air  cells are clear. Major intracranial vascular flow voids are preserved. No suspicious skull lesion is identified. IMPRESSION: 1. No evidence of intracranial metastatic disease. 2. Chronic ischemic changes including small chronic left frontal infarcts. Electronically Signed   By: ALogan BoresM.D.   On: 007-05-1707:42   Nm Pet Image Initial (pi) Skull Base To Thigh  10/15/2015  CLINICAL DATA:  Initial treatment strategy for non-small cell lung cancer. EXAM: NUCLEAR MEDICINE PET SKULL BASE TO THIGH TECHNIQUE: 12.07 mCi F-18 FDG was injected intravenously. Full-ring PET imaging was performed from the skull base to thigh after the radiotracer. CT data was obtained and used for attenuation correction and anatomic localization. FASTING BLOOD GLUCOSE:  Value: 124 mg/dl COMPARISON:  Chest CT 09/22/2015 FINDINGS: NECK No hypermetabolic lymph nodes in the neck. CHEST Large right upper lobe lung mass is markedly hypermetabolic with SUV max of 19. There is right hilar and mediastinal lymphadenopathy which is also hypermetabolic. A right epicardial lymph image number 107 measures 21 mm and has SUV max of 4.9. Sub carinal lymph node on image 1 to measures 13 mm and SUV max is  2.85. No contralateral mediastinal or hilar adenopathy. Small subpleural nodular density on image 125 is not hypermetabolic and likely subpleural atelectasis. No definite metastatic pulmonary nodules in the left lung. Stable underlying emphysematous changes and pulmonary scarring. Small right-sided pleural effusion but no hypermetabolism or pleural nodularity. Stable three-vessel coronary artery calcifications. ABDOMEN/PELVIS No abnormal hypermetabolic activity within the liver, pancreas, adrenal glands, or spleen. No hypermetabolic lymph nodes in the abdomen or pelvis. The low-attenuation liver lesions seen on the prior CT scan is not hypermetabolic and somewhat photopenic. This is likely a benign cyst. No adrenal gland metastasis. Incidental findings  include age advanced aortic and branch vessel atherosclerotic calcifications and bilateral inguinal hernias containing bowel. SKELETON No focal hypermetabolic activity to suggest skeletal metastasis. IMPRESSION: 1. Large right upper lobe lung mass consistent with known neoplasm. Mediastinal and hilar lymphadenopathy. 2. No findings for metastatic disease involving the lungs, neck, abdomen/pelvis or osseous structures. Electronically Signed   By: Marijo Sanes M.D.   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  PLAN:    1. Stage III B poorly differentiated carcinoma of the lung: CT and PET results reviewed independently and reported as above. MRI of brain negative for metastatic disease.  Pathology consistent with poorly differentiated carcinoma, but there is not enough tissue for additional molecular testing. Proceed with cycle 1 of weekly carboplatinum and Taxol. Continue daily XRT.  At the conclusion of his XRT, patient will likely benefit from 2 consolidation doses of chemotherapy. Return to clinic in 1 week for consideration of cycle 2. 2. Venous access: Patient has now request port placement and a referral was given to surgery for further evaluation.  3. Leukocytosis: Likely reactive, monitor. 4. Thrombocytosis: Reactive as well, monitor.  Patient expressed understanding and was in agreement with this plan. He also understands that He can call clinic at any time with any questions, concerns, or complaints.   Lung cancer [facility])   Staging form: Lung, AJCC 7th Edition     Clinical stage from 10/17/2015: Stage IIIB (T4, N2, M0) - Signed by Lloyd Huger, MD on 10/17/2015   Lloyd Huger, MD   10/27/2015 1:06 PM

## 2015-10-27 NOTE — Telephone Encounter (Signed)
Pt advised of pre op date/time and sx date. Sx: 10/29/15 with Dr Rolley Sims Placement. Pre op: 10/27/15 after his radiation appointment--in office.   Patient made aware to call [phone], between 1-3:00pm the day before surgery, to find out what time to arrive.

## 2015-10-28 ENCOUNTER — Inpatient Hospital Stay: Payer: Medicaid Other

## 2015-10-28 ENCOUNTER — Inpatient Hospital Stay (HOSPITAL_BASED_OUTPATIENT_CLINIC_OR_DEPARTMENT_OTHER): Payer: Medicaid Other | Admitting: Oncology

## 2015-10-28 ENCOUNTER — Ambulatory Visit
Admission: RE | Admit: 2015-10-28 | Discharge: 2015-10-28 | Disposition: A | Discharge status: Home / Self Care | Payer: Medicaid Other | Source: Ambulatory Visit | Attending: Radiation Oncology | Admitting: Radiation Oncology

## 2015-10-28 ENCOUNTER — Ambulatory Visit: Payer: Medicaid Other

## 2015-10-28 VITALS — BP 135/69 | HR 101 | Temp 95.8°F | Resp 18 | Wt 162.9 lb

## 2015-10-28 DIAGNOSIS — I129 Hypertensive chronic kidney disease with stage 1 through stage 4 chronic kidney disease, or unspecified chronic kidney disease: Secondary | ICD-10-CM

## 2015-10-28 DIAGNOSIS — N189 Chronic kidney disease, unspecified: Secondary | ICD-10-CM

## 2015-10-28 DIAGNOSIS — C3411 Malignant neoplasm of upper lobe, right bronchus or lung: Secondary | ICD-10-CM

## 2015-10-28 DIAGNOSIS — Z79899 Other long term (current) drug therapy: Secondary | ICD-10-CM

## 2015-10-28 DIAGNOSIS — D72829 Elevated white blood cell count, unspecified: Secondary | ICD-10-CM

## 2015-10-28 DIAGNOSIS — D473 Essential (hemorrhagic) thrombocythemia: Secondary | ICD-10-CM

## 2015-10-28 DIAGNOSIS — I509 Heart failure, unspecified: Secondary | ICD-10-CM

## 2015-10-28 DIAGNOSIS — J449 Chronic obstructive pulmonary disease, unspecified: Secondary | ICD-10-CM

## 2015-10-28 DIAGNOSIS — I4891 Unspecified atrial fibrillation: Secondary | ICD-10-CM

## 2015-10-28 DIAGNOSIS — Z8701 Personal history of pneumonia (recurrent): Secondary | ICD-10-CM

## 2015-10-28 DIAGNOSIS — Z510 Encounter for antineoplastic radiation therapy: Secondary | ICD-10-CM | POA: Diagnosis not present

## 2015-10-28 DIAGNOSIS — Z87891 Personal history of nicotine dependence: Secondary | ICD-10-CM

## 2015-10-28 DIAGNOSIS — Z5111 Encounter for antineoplastic chemotherapy: Secondary | ICD-10-CM | POA: Diagnosis not present

## 2015-10-28 MED ORDER — FAMOTIDINE IN NACL 20-0.9 MG/50ML-% IV SOLN
20.0000 mg | Freq: Once | INTRAVENOUS | Status: AC
Start: 2015-10-28 — End: 2015-10-28
  Administered 2015-10-28: 20 mg via INTRAVENOUS
  Filled 2015-10-28: qty 50

## 2015-10-28 MED ORDER — HYDROCODONE-ACETAMINOPHEN 5-325 MG PO TABS: 1.0000 | ORAL_TABLET | ORAL | Status: DC | PRN

## 2015-10-28 MED ORDER — PALONOSETRON HCL INJECTION 0.25 MG/5ML
0.2500 mg | Freq: Once | INTRAVENOUS | Status: AC
  Administered 2015-10-28: 0.25 mg via INTRAVENOUS
  Filled 2015-10-28: qty 5

## 2015-10-28 MED ORDER — CARBOPLATIN CHEMO INJECTION 450 MG/45ML
250.8000 mg | Freq: Once | INTRAVENOUS | Status: AC
  Administered 2015-10-28: 250 mg via INTRAVENOUS
  Filled 2015-10-28: qty 25

## 2015-10-28 MED ORDER — LIDOCAINE-PRILOCAINE 2.5-2.5 % EX CREA: TOPICAL_CREAM | CUTANEOUS | Status: AC

## 2015-10-28 MED ORDER — ALBUTEROL SULFATE HFA 108 (90 BASE) MCG/ACT IN AERS: 1.0000 | INHALATION_SPRAY | RESPIRATORY_TRACT | Status: DC | PRN

## 2015-10-28 MED ORDER — DEXTROSE 5 % IV SOLN
45.0000 mg/m2 | Freq: Once | INTRAVENOUS | Status: AC
  Administered 2015-10-28: 90 mg via INTRAVENOUS
  Filled 2015-10-28: qty 15

## 2015-10-28 MED ORDER — SODIUM CHLORIDE 0.9 % IV SOLN
10.0000 mg | Freq: Once | INTRAVENOUS | Status: AC
  Administered 2015-10-28: 10 mg via INTRAVENOUS
  Filled 2015-10-28: qty 1

## 2015-10-28 MED ORDER — DIPHENHYDRAMINE HCL 50 MG/ML IJ SOLN
25.0000 mg | Freq: Once | INTRAMUSCULAR | Status: AC
Start: 2015-10-28 — End: 2015-10-28
  Administered 2015-10-28: 25 mg via INTRAVENOUS
  Filled 2015-10-28: qty 1

## 2015-10-28 MED ORDER — SODIUM CHLORIDE 0.9 % IV SOLN
Freq: Once | INTRAVENOUS | Status: AC
  Administered 2015-10-28: 10:00:00 via INTRAVENOUS
  Filled 2015-10-28: qty 1000

## 2015-10-28 NOTE — Progress Notes (Signed)
[place]  Telephone:(336) [phone] Fax:(336) [phone]  ID: Sean Hall OB: Apr 07, 1951  MR#: [id number]  AOZ#:[id number]  Patient Care Team: No Pcp Per Patient as PCP - General ([place]) Nestor Lewandowsky, MD as Consulting Physician (Cardiothoracic Surgery)  CHIEF COMPLAINT:  Chief Complaint  Patient presents with  . Lung Cancer    INTERVAL HISTORY: Patient returns to clinic today for further evaluation and consideration of cycle 2 of weekly carboplatinum and Taxol concurrently with daily XRT. He tolerated his first treatment well without significant side effects. He currently feels well and is asymptomatic. He does complain of back pain. He has no neurologic complaints. He denies any fevers. He denies any chest pain, shortness of breath, or hemoptysis. He has a chronic cough that is unchanged.  He denies any weight loss. He denies any nausea, vomiting, consultation, or diarrhea. He has no urinary complaints. Patient otherwise feels well and offers no further specific complaints.  REVIEW OF SYSTEMS:   Review of Systems  Constitutional: Negative for fever, weight loss and malaise/fatigue.  Respiratory: Positive for cough. Negative for hemoptysis and shortness of breath.   Cardiovascular: Negative.  Negative for chest pain.  Gastrointestinal: Negative.   Genitourinary: Negative.   Musculoskeletal: Positive for back pain.  Neurological: Negative.  Negative for weakness.  Psychiatric/Behavioral: The patient is nervous/anxious. The patient does not have insomnia.     As per HPI. Otherwise, a complete review of systems is negatve.  PAST MEDICAL HISTORY: Past Medical History  Diagnosis Date  . Atrial fibrillation ([place])   . CHF (congestive heart failure) ([place])   . Hypertension   . Disease of lung   . COPD (chronic obstructive pulmonary disease) ([place])   . Pneumonia   . Chronic kidney disease   . Cancer ([place]) 2017    Lung  . Dysrhythmia   . Headache   .  Arthritis   . History of chemotherapy   . History of radiation therapy     PAST SURGICAL HISTORY: Past Surgical History  Procedure Laterality Date  . Knee surgery Bilateral   . Tonsillectomy      FAMILY HISTORY Family History  Problem Relation Age of Onset  . Diabetes Father   . Hypertension Father        ADVANCED DIRECTIVES:    HEALTH MAINTENANCE: Social History  Substance Use Topics  . Smoking status: Former Smoker -- 2.00 packs/day for 50 years    Types: Cigarettes    Quit date: 08/07/2013  . Smokeless tobacco: Never Used  . Alcohol Use: 0.0 oz/week    0 Standard drinks or equivalent per week     Comment: rarely     Colonoscopy:  PAP:  Bone density:  Lipid panel:  Allergies  Allergen Reactions  . Aleve [Naproxen Sodium] Hives, Shortness Of Breath and Swelling    Naproxin    Current Outpatient Prescriptions  Medication Sig Dispense Refill  . albuterol (PROAIR HFA) 108 (90 Base) MCG/ACT inhaler Inhale 1-2 puffs into the lungs every 4 (four) hours as needed. 1 Inhaler 0  . benzonatate (TESSALON) 200 MG capsule Take 200 mg by mouth 3 (three) times daily as needed for cough.    . lidocaine-prilocaine (EMLA) cream Apply to affected area once 30 g 3  . Multiple Vitamin (MULTI-VITAMINS) TABS Take 1 tablet by mouth daily.     . ondansetron (ZOFRAN) 8 MG tablet Take 1 tablet (8 mg total) by mouth 2 (two) times daily as needed for refractory nausea / vomiting. Start  on day 3 after chemo. 30 tablet 1  . prochlorperazine (COMPAZINE) 10 MG tablet Take 1 tablet (10 mg total) by mouth every 6 (six) hours as needed (Nausea or vomiting). 30 tablet 1  . HYDROcodone-acetaminophen (NORCO/VICODIN) 5-325 MG tablet Take 1 tablet by mouth every 4 (four) hours as needed for moderate pain. 60 tablet 0   No current facility-administered medications for this visit.    OBJECTIVE: Filed Vitals:   10/28/15 0848  BP: 135/69  Pulse: 101  Temp: 95.8 F (35.4 C)  Resp: 18     Body  mass index is 20.36 kg/(m^2).    ECOG FS:0 - Asymptomatic  General: Well-developed, well-nourished, no acute distress. Eyes: Pink conjunctiva, anicteric sclera. Lungs: Clear to auscultation bilaterally. Heart: Regular rate and rhythm. No rubs, murmurs, or gallops. Abdomen: Soft, nontender, nondistended. No organomegaly noted, normoactive bowel sounds. Musculoskeletal: No edema, cyanosis, or clubbing. Neuro: Alert, answering all questions appropriately. Cranial nerves grossly intact. Skin: No rashes or petechiae noted. Psych: Normal affect.   LAB RESULTS:  Lab Results  Component Value Date   NA 132* 10/27/2015   K 4.4 10/27/2015   CL 96* 10/27/2015   CO2 27 10/27/2015   GLUCOSE 114* 10/27/2015   BUN 17 10/27/2015   CREATININE 0.80 10/27/2015   CALCIUM 9.0 10/27/2015   PROT 8.5* 10/27/2015   ALBUMIN 2.7* 10/27/2015   AST 16 10/27/2015   ALT 18 10/27/2015   ALKPHOS 152* 10/27/2015   BILITOT 0.1* 10/27/2015   GFRNONAA >60 10/27/2015   GFRAA >60 10/27/2015    Lab Results  Component Value Date   WBC 13.0* 10/27/2015   NEUTROABS 11.2* 10/21/2015   HGB 10.5* 10/27/2015   HCT 32.0* 10/27/2015   MCV 71.8* 10/27/2015   PLT 509* 10/27/2015     STUDIES: Mr Jeri Cos Wo Contrast  11-08-15  CLINICAL DATA:  Recently diagnosed non-small cell lung cancer involving the right upper lobe. Staging. Occasional headaches. EXAM: MRI HEAD WITHOUT AND WITH CONTRAST TECHNIQUE: Multiplanar, multiecho pulse sequences of the brain and surrounding structures were obtained without and with intravenous contrast. CONTRAST:  70m MULTIHANCE GADOBENATE DIMEGLUMINE 529 MG/ML IV SOLN COMPARISON:  None. FINDINGS: There is no evidence of acute infarct, intracranial hemorrhage, mass, midline shift, or extra-axial fluid collection. There are small, chronic cortical and subcortical infarcts in the left frontal lobe. Small foci of T2 hyperintensity elsewhere involving the white matter of the left greater than  right cerebral hemispheres are nonspecific but compatible with chronic small vessel ischemic disease, mildly advanced for age. There is slight ex vacuo enlargement of the frontal horn of the left lateral ventricle. No abnormal enhancement is identified. Orbits are unremarkable. Paranasal sinuses and mastoid air cells are clear. Major intracranial vascular flow voids are preserved. No suspicious skull lesion is identified. IMPRESSION: 1. No evidence of intracranial metastatic disease. 2. Chronic ischemic changes including small chronic left frontal infarcts. Electronically Signed   By: ALogan BoresM.D.   On: 007/06/1707:42   Nm Pet Image Initial (pi) Skull Base To Thigh  10/15/2015  CLINICAL DATA:  Initial treatment strategy for non-small cell lung cancer. EXAM: NUCLEAR MEDICINE PET SKULL BASE TO THIGH TECHNIQUE: 12.07 mCi F-18 FDG was injected intravenously. Full-ring PET imaging was performed from the skull base to thigh after the radiotracer. CT data was obtained and used for attenuation correction and anatomic localization. FASTING BLOOD GLUCOSE:  Value: 124 mg/dl COMPARISON:  Chest CT 09/22/2015 FINDINGS: NECK No hypermetabolic lymph nodes in the neck. CHEST Large  right upper lobe lung mass is markedly hypermetabolic with SUV max of 19. There is right hilar and mediastinal lymphadenopathy which is also hypermetabolic. A right epicardial lymph image number 107 measures 21 mm and has SUV max of 4.9. Sub carinal lymph node on image 1 to measures 13 mm and SUV max is 2.85. No contralateral mediastinal or hilar adenopathy. Small subpleural nodular density on image 125 is not hypermetabolic and likely subpleural atelectasis. No definite metastatic pulmonary nodules in the left lung. Stable underlying emphysematous changes and pulmonary scarring. Small right-sided pleural effusion but no hypermetabolism or pleural nodularity. Stable three-vessel coronary artery calcifications. ABDOMEN/PELVIS No abnormal  hypermetabolic activity within the liver, pancreas, adrenal glands, or spleen. No hypermetabolic lymph nodes in the abdomen or pelvis. The low-attenuation liver lesions seen on the prior CT scan is not hypermetabolic and somewhat photopenic. This is likely a benign cyst. No adrenal gland metastasis. Incidental findings include age advanced aortic and branch vessel atherosclerotic calcifications and bilateral inguinal hernias containing bowel. SKELETON No focal hypermetabolic activity to suggest skeletal metastasis. IMPRESSION: 1. Large right upper lobe lung mass consistent with known neoplasm. Mediastinal and hilar lymphadenopathy. 2. No findings for metastatic disease involving the lungs, neck, abdomen/pelvis or osseous structures. Electronically Signed   By: Marijo Sanes M.D.   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  PLAN:    1. Stage III B poorly differentiated carcinoma of the right upper lobe lung: CT and PET results reviewed independently and reported as above. MRI of brain negative for metastatic disease.  Pathology consistent with poorly differentiated carcinoma, but there is not enough tissue for additional molecular testing. Proceed with cycle 2 of weekly carboplatinum and Taxol. Continue daily XRT.  Patient is scheduled for port placement tomorrow.  At the conclusion of his XRT, patient will likely benefit from 2 consolidation doses of chemotherapy. Return to clinic in 1 week for consideration of cycle 3. 2. Venous access: Port placement tomorrow.  3. Leukocytosis: Likely reactive, monitor. 4. Thrombocytosis: Reactive as well, monitor. 5. Back pain: Patient was given a prescription for Norco today.  Patient expressed understanding and was in agreement with this plan. He also understands that He can call clinic at any time with any questions, concerns, or complaints.   Lung cancer [facility])   Staging form: Lung,  AJCC 7th Edition     Clinical stage from 10/17/2015: Stage IIIB (T4, N2, M0) - Signed by Lloyd Huger, MD on 10/17/2015   Lloyd Huger, MD   10/28/2015 9:24 AM

## 2015-10-28 NOTE — Progress Notes (Signed)
States continues to have right upper back pain, takes OTC pain medication but states does not help relieve and unable to sleep at night due to pain. Would like prescription for pain medication. Needs refill for proair and also would like emla cream to be escribed to [facility]. Prescription has been sent to [facility]. PAC scheduled for placement tomorrow (6/22) by Dr. Genevive Bi.

## 2015-10-29 ENCOUNTER — Encounter
Admission: RE | Disposition: A | Discharge status: Home / Self Care | Payer: Self-pay | Source: Ambulatory Visit | Attending: Cardiothoracic Surgery

## 2015-10-29 ENCOUNTER — Ambulatory Visit: Payer: Medicaid Other

## 2015-10-29 ENCOUNTER — Ambulatory Visit: Payer: Self-pay

## 2015-10-29 ENCOUNTER — Other Ambulatory Visit: Payer: Self-pay

## 2015-10-29 ENCOUNTER — Ambulatory Visit
Admission: RE | Admit: 2015-10-29 | Discharge: 2015-10-29 | Disposition: A | Discharge status: Home / Self Care | Payer: Medicaid Other | Source: Ambulatory Visit | Attending: Cardiothoracic Surgery | Admitting: Cardiothoracic Surgery

## 2015-10-29 ENCOUNTER — Ambulatory Visit: Payer: Medicaid Other | Admitting: Anesthesiology

## 2015-10-29 ENCOUNTER — Ambulatory Visit: Payer: Self-pay | Admitting: Oncology

## 2015-10-29 ENCOUNTER — Encounter: Payer: Self-pay | Admitting: *Deleted

## 2015-10-29 DIAGNOSIS — J449 Chronic obstructive pulmonary disease, unspecified: Secondary | ICD-10-CM | POA: Diagnosis not present

## 2015-10-29 DIAGNOSIS — Z87891 Personal history of nicotine dependence: Secondary | ICD-10-CM | POA: Diagnosis not present

## 2015-10-29 DIAGNOSIS — I110 Hypertensive heart disease with heart failure: Secondary | ICD-10-CM | POA: Diagnosis not present

## 2015-10-29 DIAGNOSIS — M1990 Unspecified osteoarthritis, unspecified site: Secondary | ICD-10-CM | POA: Diagnosis not present

## 2015-10-29 DIAGNOSIS — C3411 Malignant neoplasm of upper lobe, right bronchus or lung: Secondary | ICD-10-CM

## 2015-10-29 DIAGNOSIS — I509 Heart failure, unspecified: Secondary | ICD-10-CM | POA: Diagnosis not present

## 2015-10-29 DIAGNOSIS — Z09 Encounter for follow-up examination after completed treatment for conditions other than malignant neoplasm: Secondary | ICD-10-CM

## 2015-10-29 HISTORY — PX: PORTACATH PLACEMENT: SHX2246

## 2015-10-29 LAB — PROTIME-INR
INR: 1.26
PROTHROMBIN TIME: 15.9 s — AB (ref 11.4–15.0)

## 2015-10-29 SURGERY — INSERTION, TUNNELED CENTRAL VENOUS DEVICE, WITH PORT
Anesthesia: General | Wound class: Clean

## 2015-10-29 MED ORDER — FAMOTIDINE 20 MG PO TABS
20.0000 mg | ORAL_TABLET | Freq: Once | ORAL | Status: AC
  Administered 2015-10-29: 20 mg via ORAL

## 2015-10-29 MED ORDER — LACTATED RINGERS IV SOLN
INTRAVENOUS | Status: DC
  Administered 2015-10-29 (×2): via INTRAVENOUS

## 2015-10-29 MED ORDER — CEFAZOLIN IN D5W 1 GM/50ML IV SOLN
INTRAVENOUS | Status: AC
  Administered 2015-10-29: 1 g via INTRAVENOUS
  Filled 2015-10-29: qty 50

## 2015-10-29 MED ORDER — PROPOFOL 10 MG/ML IV BOLUS
INTRAVENOUS | Status: DC | PRN
  Administered 2015-10-29: 150 mg via INTRAVENOUS

## 2015-10-29 MED ORDER — FENTANYL CITRATE (PF) 100 MCG/2ML IJ SOLN
INTRAMUSCULAR | Status: DC
Start: 2015-10-29 — End: 2015-10-29
  Filled 2015-10-29: qty 2

## 2015-10-29 MED ORDER — LIDOCAINE HCL (CARDIAC) 20 MG/ML IV SOLN
INTRAVENOUS | Status: DC | PRN
  Administered 2015-10-29: 40 mg via INTRAVENOUS

## 2015-10-29 MED ORDER — FAMOTIDINE 20 MG PO TABS
ORAL_TABLET | ORAL | Status: AC
  Administered 2015-10-29: 20 mg via ORAL
  Filled 2015-10-29: qty 1

## 2015-10-29 MED ORDER — HEPARIN SODIUM (PORCINE) 5000 UNIT/ML IJ SOLN
INTRAMUSCULAR | Status: AC
  Filled 2015-10-29: qty 1

## 2015-10-29 MED ORDER — ONDANSETRON HCL 4 MG/2ML IJ SOLN: 4.0000 mg | Freq: Once | INTRAMUSCULAR | Status: DC | PRN

## 2015-10-29 MED ORDER — FENTANYL CITRATE (PF) 100 MCG/2ML IJ SOLN
INTRAMUSCULAR | Status: DC | PRN
  Administered 2015-10-29: 50 ug via INTRAVENOUS

## 2015-10-29 MED ORDER — LIDOCAINE HCL 1 % IJ SOLN
INTRAMUSCULAR | Status: DC | PRN
  Administered 2015-10-29: 6 mL

## 2015-10-29 MED ORDER — MIDAZOLAM HCL 2 MG/2ML IJ SOLN
INTRAMUSCULAR | Status: DC | PRN
  Administered 2015-10-29 (×2): 1 mg via INTRAVENOUS

## 2015-10-29 MED ORDER — LIDOCAINE HCL (PF) 1 % IJ SOLN
INTRAMUSCULAR | Status: AC
  Filled 2015-10-29: qty 30

## 2015-10-29 MED ORDER — FENTANYL CITRATE (PF) 100 MCG/2ML IJ SOLN
25.0000 ug | INTRAMUSCULAR | Status: DC | PRN
  Administered 2015-10-29 (×4): 25 ug via INTRAVENOUS

## 2015-10-29 MED ORDER — SODIUM CHLORIDE 0.9 % IJ SOLN
INTRAMUSCULAR | Status: AC
  Filled 2015-10-29: qty 50

## 2015-10-29 MED ORDER — CEFAZOLIN IN D5W 1 GM/50ML IV SOLN
1.0000 g | INTRAVENOUS | Status: AC
  Administered 2015-10-29: 1 g via INTRAVENOUS

## 2015-10-29 MED ORDER — ONDANSETRON HCL 4 MG/2ML IJ SOLN
INTRAMUSCULAR | Status: DC | PRN
  Administered 2015-10-29: 4 mg via INTRAVENOUS

## 2015-10-29 SURGICAL SUPPLY — 37 items
BAG DECANTER FOR FLEXI CONT (MISCELLANEOUS) ×2 IMPLANT
BLADE SURG SZ11 CARB STEEL (BLADE) ×2 IMPLANT
CANISTER SUCT 1200ML W/VALVE (MISCELLANEOUS) ×2 IMPLANT
CHLORAPREP W/TINT 26ML (MISCELLANEOUS) ×2 IMPLANT
COVER LIGHT HANDLE STERIS (MISCELLANEOUS) ×4 IMPLANT
DRAPE C-ARM XRAY 36X54 (DRAPES) ×2 IMPLANT
DRESSING TELFA 4X3 1S ST N-ADH (GAUZE/BANDAGES/DRESSINGS) ×2 IMPLANT
DRSG TEGADERM 2-3/8X2-3/4 SM (GAUZE/BANDAGES/DRESSINGS) ×2 IMPLANT
DRSG TEGADERM 4X4.75 (GAUZE/BANDAGES/DRESSINGS) ×2 IMPLANT
ELECT CAUTERY BLADE TIP 2.5 (TIP) ×2
ELECT REM PT RETURN 9FT ADLT (ELECTROSURGICAL) ×2
ELECTRODE CAUTERY BLDE TIP 2.5 (TIP) ×1 IMPLANT
ELECTRODE REM PT RTRN 9FT ADLT (ELECTROSURGICAL) ×1 IMPLANT
GLOVE SURG SYN 7.5  E (GLOVE) ×1
GLOVE SURG SYN 7.5 E (GLOVE) ×1 IMPLANT
GOWN STRL REUS W/ TWL LRG LVL3 (GOWN DISPOSABLE) ×2 IMPLANT
GOWN STRL REUS W/TWL LRG LVL3 (GOWN DISPOSABLE) ×2
IV NS 500ML (IV SOLUTION) ×1
IV NS 500ML BAXH (IV SOLUTION) ×1 IMPLANT
KIT PORT POWER 8FR ISP CVUE (Catheter) ×2 IMPLANT
KIT RM TURNOVER STRD PROC AR (KITS) ×2 IMPLANT
LABEL OR SOLS (LABEL) ×2 IMPLANT
MARKER SKIN DUAL TIP RULER LAB (MISCELLANEOUS) ×2 IMPLANT
NDL SAFETY 22GX1.5 (NEEDLE) ×2 IMPLANT
NEEDLE FILTER BLUNT 18X 1/2SAF (NEEDLE) ×1
NEEDLE FILTER BLUNT 18X1 1/2 (NEEDLE) ×1 IMPLANT
NS IRRIG 500ML POUR BTL (IV SOLUTION) ×2 IMPLANT
PACK PORT-A-CATH (MISCELLANEOUS) ×2 IMPLANT
SUT ETHILON 4-0 (SUTURE) ×2
SUT ETHILON 4-0 FS2 18XMFL BLK (SUTURE) ×2
SUT PROLENE 2 0 SH DA (SUTURE) ×4 IMPLANT
SUT VIC AB 3-0 SH 27 (SUTURE) ×1
SUT VIC AB 3-0 SH 27X BRD (SUTURE) ×1 IMPLANT
SUTURE ETHLN 4-0 FS2 18XMF BLK (SUTURE) ×2 IMPLANT
SYR 3ML LL SCALE MARK (SYRINGE) ×2 IMPLANT
SYRINGE 10CC LL (SYRINGE) ×2 IMPLANT
TAPE TRANSPORE STRL 2 31045 (GAUZE/BANDAGES/DRESSINGS) ×2 IMPLANT

## 2015-10-29 NOTE — Anesthesia Procedure Notes (Signed)
Procedure Name: LMA Insertion Date/Time: 10/29/2015 7:38 AM Performed by: Courtney Paris Pre-anesthesia Checklist: Patient identified, Emergency Drugs available, Suction available, Patient being monitored and Timeout performed Patient Re-evaluated:Patient Re-evaluated prior to inductionOxygen Delivery Method: Circle system utilized Preoxygenation: Pre-oxygenation with 100% oxygen Intubation Type: IV induction and Combination inhalational/ intravenous induction Ventilation: Mask ventilation without difficulty LMA: LMA inserted LMA Size: 4.0 Grade View: Grade II Tube type: Oral Number of attempts: 1 Placement Confirmation: positive ETCO2,  CO2 detector and breath sounds checked- equal and bilateral Tube secured with: Tape Dental Injury: Teeth and Oropharynx as per pre-operative assessment

## 2015-10-29 NOTE — Interval H&P Note (Signed)
History and Physical Interval Note:  10/29/2015 7:09 AM  Sean Hall  has presented today for surgery, with the diagnosis of MALIGNANT NEOPLASM RIGHT UPPER LUNG  The various methods of treatment have been discussed with the patient and family. After consideration of risks, benefits and other options for treatment, the patient has consented to  Procedure(s): INSERTION PORT-A-CATH (N/A) as a surgical intervention .  The patient's history has been reviewed, patient examined, no change in status, stable for surgery.  I have reviewed the patient's chart and labs.  Questions were answered to the patient's satisfaction.     Nestor Lewandowsky

## 2015-10-29 NOTE — Transfer of Care (Signed)
Immediate Anesthesia Transfer of Care Note  Patient: Sean Hall  Procedure(s) Performed: Procedure(s): INSERTION PORT-A-CATH (N/A)  Patient Location: PACU  Anesthesia Type:General  Level of Consciousness: sedated  Airway & Oxygen Therapy: Patient Spontanous Breathing and Patient connected to face mask oxygen  Post-op Assessment: Report given to RN and Post -op Vital signs reviewed and stable  Post vital signs: Reviewed and stable  Last Vitals:  Filed Vitals:   10/29/15 0645  BP: 134/68  Pulse: 111  Temp: 36.6 C  Resp: 14    Last Pain:  Filed Vitals:   10/29/15 0647  PainSc: 7          Complications: No apparent anesthesia complications

## 2015-10-29 NOTE — H&P (View-Only) (Signed)
Sean Hall Inpatient Post-Op Note  Patient ID: Sean Hall, male   DOB: 11-03-1950, 65 y.o.   MRN: [id number]  HISTORY: This gentleman is a 64 year old male with a recent diagnosis of adenocarcinoma the lung. He is been seen by Dr. Grayland Ormond and Dr. Donella Stade and is begun on chemotherapy and radiation therapy for his unresectable lung cancer. The patient comes in today to discuss Port-A-Cath placement. He states he does get short of breath with exertion. He is not had any fevers or chills. He tolerated his first round of chemotherapy well and is scheduled to get a second round later this week. In addition he said for 5 treatments of radiation therapy.   Filed Vitals:   10/27/15 0942  BP: 156/88  Pulse: 88  Temp: 97.8 F (36.6 C)  Resp: 24     EXAM: Resp: Lungs are clear bilaterally.  No respiratory distress, normal effort. Heart:  Regular without murmurs Abd:  Abdomen is soft, non distended and non tender. No masses are palpable.  There is no rebound and no guarding.  Neurological: Alert and oriented to person, place, and time. Coordination normal.  Skin: Skin is warm and dry. No rash noted. No diaphoretic. No erythema. No pallor.  Psychiatric: Normal mood and affect. Normal behavior. Judgment and thought content normal.    ASSESSMENT: I have independently reviewed his chest CT. I discussed his care with Dr. Grayland Ormond. 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 Whether this is dynamic or fixed is unclear. This CT scan was from approximately one month ago.  The patient would be interested in having a Port-A-Cath placed. I explained to him the indications and risks of Port-A-Cath placement. Risks of bleeding, infection, pneumothorax were all discussed. He will have his preoperative labs drawn today. We will plan on Port-A-Cath placement later this week.   PLAN:   We will plan on Port-A-Cath placement later this week. He will undergo  ultrasound intraoperatively of his accessible subclavian and internal jugular veins. He is aware of the risks.    Nestor Lewandowsky, MD

## 2015-10-29 NOTE — Discharge Instructions (Signed)
General Anesthesia, Adult, Care After Refer to this sheet in the next few weeks. These instructions provide you with information on caring for yourself after your procedure. Your health care provider may also give you more specific instructions. Your treatment has been planned according to current medical practices, but problems sometimes occur. Call your health care provider if you have any problems or questions after your procedure. WHAT TO EXPECT AFTER THE PROCEDURE After the procedure, it is typical to experience:  Sleepiness.  Nausea and vomiting. HOME CARE INSTRUCTIONS  For the first 24 hours after general anesthesia:  Have a responsible person with you.  Do not drive a car. If you are alone, do not take public transportation.  Do not drink alcohol.  Do not take medicine that has not been prescribed by your health care provider.  Do not sign important papers or make important decisions.  You may resume a normal diet and activities as directed by your health care provider.  Change bandages (dressings) as directed.  If you have questions or problems that seem related to general anesthesia, call the hospital and ask for the anesthetist or anesthesiologist on call. SEEK MEDICAL CARE IF:  You have nausea and vomiting that continue the day after anesthesia.  You develop a rash. SEEK IMMEDIATE MEDICAL CARE IF:   You have difficulty breathing.  You have chest pain.  You have any allergic problems.   This information is not intended to replace advice given to you by your health care provider. Make sure you discuss any questions you have with your health care provider.   Document Released: 08/01/2000 Document Revised: 05/16/2014 Document Reviewed: 08/24/2011 Elsevier Interactive Patient Education 2016 [place] Taking care of your wound properly can help to prevent pain and infection. It can also help your wound to heal more quickly.  HOW TO CARE FOR YOUR  WOUND  Take or apply over-the-counter and prescription medicines only as told by your health care provider.  If you were prescribed antibiotic medicine, take or apply it as told by your health care provider. Do not stop using the antibiotic even if your condition improves.  Clean the wound each day or as told by your health care provider.  Wash the wound with mild soap and water.  Rinse the wound with water to remove all soap.  Pat the wound dry with a clean towel. Do not rub it.  There are many different ways to close and cover a wound. For example, a wound can be covered with stitches (sutures), skin glue, or adhesive strips. Follow instructions from your health care provider about:  How to take care of your wound.  When and how you should change your bandage (dressing).  When you should remove your dressing.  Removing whatever was used to close your wound.  Check your wound every day for signs of infection. Watch for:  Redness, swelling, or pain.  Fluid, blood, or pus.  Keep the dressing dry until your health care provider says it can be removed. Do not take baths, swim, use a hot tub, or do anything that would put your wound underwater until your health care provider approves.  Raise (elevate) the injured area above the level of your heart while you are sitting or lying down.  Do not scratch or pick at the wound.  Keep all follow-up visits as told by your health care provider. This is important. SEEK MEDICAL CARE IF:  You received a tetanus shot and you  have swelling, severe pain, redness, or bleeding at the injection site.  You have a fever.  Your pain is not controlled with medicine.  You have increased redness, swelling, or pain at the site of your wound.  You have fluid, blood, or pus coming from your wound.  You notice a bad smell coming from your wound or your dressing. SEEK IMMEDIATE MEDICAL CARE IF:  You have a red streak going away from your wound.     This information is not intended to replace advice given to you by your health care provider. Make sure you discuss any questions you have with your health care provider.   Document Released: 02/02/2008 Document Revised: 09/09/2014 Document Reviewed: 04/21/2014 Elsevier Interactive Patient Education [employer].

## 2015-10-29 NOTE — Anesthesia Preprocedure Evaluation (Signed)
Anesthesia Evaluation  Patient identified by MRN, date of birth, ID band Patient awake    Reviewed: Allergy & Precautions, NPO status , Patient's Chart, lab work & pertinent test results  Airway Mallampati: II  TM Distance: >3 FB     Dental  (+) Upper Dentures, Chipped, Poor Dentition   Pulmonary pneumonia, resolved, COPD,  COPD inhaler, former smoker,  Lung CA   + rhonchi  + wheezing      Cardiovascular hypertension, Pt. on medications +CHF  Normal cardiovascular exam+ dysrhythmias Atrial Fibrillation      Neuro/Psych  Headaches, negative psych ROS   GI/Hepatic negative GI ROS, Neg liver ROS,   Endo/Other  negative endocrine ROS  Renal/GU Renal InsufficiencyRenal disease  negative genitourinary   Musculoskeletal  (+) Arthritis , Osteoarthritis,    Abdominal Normal abdominal exam  (+)   Peds negative pediatric ROS (+)  Hematology negative hematology ROS (+)   Anesthesia Other Findings   Reproductive/Obstetrics                             Anesthesia Physical Anesthesia Plan  ASA: IV  Anesthesia Plan: General   Post-op Pain Management:    Induction: Intravenous  Airway Management Planned: LMA  Additional Equipment:   Intra-op Plan:   Post-operative Plan: Extubation in OR  Informed Consent: I have reviewed the patients History and Physical, chart, labs and discussed the procedure including the risks, benefits and alternatives for the proposed anesthesia with the patient or authorized representative who has indicated his/her understanding and acceptance.   Dental advisory given  Plan Discussed with: CRNA and Surgeon  Anesthesia Plan Comments:         Anesthesia Quick Evaluation

## 2015-10-29 NOTE — Op Note (Signed)
10/29/2015  8:30 AM  PATIENT:  Sean Hall  65 y.o. male  PRE-OPERATIVE DIAGNOSIS:  MALIGNANT NEOPLASM RIGHT UPPER LUNG  POST-OPERATIVE DIAGNOSIS:  MALIGNANT NEOPLASM RIGHT UPPER LUNG  PROCEDURE:  Procedure(s): INSERTION PORT-A-CATH (N/A)  SURGEON:  Surgeon(s) and Role:    * Nestor Lewandowsky, MD - Primary  ASSISTANTS: None   ANESTHESIA:   LMA  DICTATION:   The patient was brought to the operating suite and placed in the supine position. LMA anesthesia was given. The patient was then prepped and draped in usual sterile fashion. The left subclavian vein was percutaneously catheterized. A wire was placed into the venous system under fluoroscopic guidance. An appropriate site was selected on the chest wall and a Port-A-Cath pocket was created. The catheter was tunneled from the port site up to the insertion site. The catheter was then inserted through a peel-away sheath and positioned at the appropriate level in the superior vena cava. Initially the catheter was coiled in the superior vena cava at the level of the significant narrowing.  It was therefore pulled back until straight and left in that position.  The catheter was then assembled and aspirated and flushed easily. It was then secured to the anterior chest wall with interrupted Prolene sutures. The catheter was flushed one last time and the wounds were then closed. The subcutaneous tissues were closed with running absorbable sutures and the skin with nylon. Sterile dressings were applied. Patient was then transported to the recovery room in stable condition.   Nestor Lewandowsky, MD

## 2015-10-30 ENCOUNTER — Ambulatory Visit: Payer: Medicaid Other

## 2015-10-30 ENCOUNTER — Ambulatory Visit
Admission: RE | Admit: 2015-10-30 | Discharge: 2015-10-30 | Disposition: A | Discharge status: Home / Self Care | Payer: Medicaid Other | Source: Ambulatory Visit | Attending: Radiation Oncology | Admitting: Radiation Oncology

## 2015-10-30 NOTE — Anesthesia Postprocedure Evaluation (Signed)
Anesthesia Post Note  Patient: Sean Hall  Procedure(s) Performed: Procedure(s) (LRB): INSERTION PORT-A-CATH (N/A)  Patient location during evaluation: PACU Anesthesia Type: General Level of consciousness: awake and alert and oriented Pain management: pain level controlled Vital Signs Assessment: post-procedure vital signs reviewed and stable Respiratory status: spontaneous breathing Cardiovascular status: blood pressure returned to baseline Anesthetic complications: no    Last Vitals:  Filed Vitals:   10/29/15 0922 10/29/15 0952  BP: 155/78 140/72  Pulse: 103 92  Temp:    Resp: 14 16    Last Pain:  Filed Vitals:   10/29/15 0953  PainSc: 1                  Shonnie Poudrier

## 2015-11-02 ENCOUNTER — Ambulatory Visit
Admission: RE | Admit: 2015-11-02 | Discharge: 2015-11-02 | Disposition: A | Discharge status: Home / Self Care | Payer: Medicaid Other | Source: Ambulatory Visit | Attending: Radiation Oncology | Admitting: Radiation Oncology

## 2015-11-02 ENCOUNTER — Ambulatory Visit: Payer: Medicaid Other

## 2015-11-02 DIAGNOSIS — Z510 Encounter for antineoplastic radiation therapy: Secondary | ICD-10-CM | POA: Diagnosis not present

## 2015-11-03 ENCOUNTER — Ambulatory Visit: Payer: Medicaid Other

## 2015-11-03 ENCOUNTER — Ambulatory Visit
Admission: RE | Admit: 2015-11-03 | Discharge: 2015-11-03 | Disposition: A | Discharge status: Home / Self Care | Payer: Medicaid Other | Source: Ambulatory Visit | Attending: Radiation Oncology | Admitting: Radiation Oncology

## 2015-11-03 DIAGNOSIS — Z510 Encounter for antineoplastic radiation therapy: Secondary | ICD-10-CM | POA: Diagnosis not present

## 2015-11-04 ENCOUNTER — Ambulatory Visit
Admission: RE | Admit: 2015-11-04 | Discharge: 2015-11-04 | Disposition: A | Discharge status: Home / Self Care | Payer: Medicaid Other | Source: Ambulatory Visit | Attending: Oncology | Admitting: Oncology

## 2015-11-04 ENCOUNTER — Inpatient Hospital Stay (HOSPITAL_BASED_OUTPATIENT_CLINIC_OR_DEPARTMENT_OTHER): Payer: Medicaid Other | Admitting: Oncology

## 2015-11-04 ENCOUNTER — Ambulatory Visit: Payer: Medicaid Other

## 2015-11-04 ENCOUNTER — Ambulatory Visit
Admission: RE | Admit: 2015-11-04 | Discharge: 2015-11-04 | Disposition: A | Discharge status: Home / Self Care | Payer: Medicaid Other | Source: Ambulatory Visit | Attending: Radiation Oncology | Admitting: Radiation Oncology

## 2015-11-04 ENCOUNTER — Inpatient Hospital Stay: Payer: Medicaid Other

## 2015-11-04 VITALS — BP 161/76 | HR 111 | Temp 96.9°F | Resp 18 | Wt 163.0 lb

## 2015-11-04 DIAGNOSIS — Z95828 Presence of other vascular implants and grafts: Secondary | ICD-10-CM

## 2015-11-04 DIAGNOSIS — I509 Heart failure, unspecified: Secondary | ICD-10-CM

## 2015-11-04 DIAGNOSIS — I129 Hypertensive chronic kidney disease with stage 1 through stage 4 chronic kidney disease, or unspecified chronic kidney disease: Secondary | ICD-10-CM

## 2015-11-04 DIAGNOSIS — J449 Chronic obstructive pulmonary disease, unspecified: Secondary | ICD-10-CM | POA: Diagnosis not present

## 2015-11-04 DIAGNOSIS — J984 Other disorders of lung: Secondary | ICD-10-CM | POA: Insufficient documentation

## 2015-11-04 DIAGNOSIS — Z510 Encounter for antineoplastic radiation therapy: Secondary | ICD-10-CM | POA: Diagnosis not present

## 2015-11-04 DIAGNOSIS — N189 Chronic kidney disease, unspecified: Secondary | ICD-10-CM

## 2015-11-04 DIAGNOSIS — Z79899 Other long term (current) drug therapy: Secondary | ICD-10-CM

## 2015-11-04 DIAGNOSIS — Z8701 Personal history of pneumonia (recurrent): Secondary | ICD-10-CM

## 2015-11-04 DIAGNOSIS — D473 Essential (hemorrhagic) thrombocythemia: Secondary | ICD-10-CM

## 2015-11-04 DIAGNOSIS — C3411 Malignant neoplasm of upper lobe, right bronchus or lung: Secondary | ICD-10-CM | POA: Diagnosis not present

## 2015-11-04 DIAGNOSIS — Z5111 Encounter for antineoplastic chemotherapy: Secondary | ICD-10-CM | POA: Diagnosis not present

## 2015-11-04 DIAGNOSIS — D72829 Elevated white blood cell count, unspecified: Secondary | ICD-10-CM

## 2015-11-04 DIAGNOSIS — Z87891 Personal history of nicotine dependence: Secondary | ICD-10-CM

## 2015-11-04 DIAGNOSIS — I4891 Unspecified atrial fibrillation: Secondary | ICD-10-CM

## 2015-11-04 LAB — COMPREHENSIVE METABOLIC PANEL
ALT: 15 U/L — ABNORMAL LOW (ref 17–63)
AST: 15 U/L (ref 15–41)
Albumin: 2.7 g/dL — ABNORMAL LOW (ref 3.5–5.0)
Alkaline Phosphatase: 162 U/L — ABNORMAL HIGH (ref 38–126)
Anion gap: 8 (ref 5–15)
BUN: 12 mg/dL (ref 6–20)
CHLORIDE: 96 mmol/L — AB (ref 101–111)
CO2: 26 mmol/L (ref 22–32)
CREATININE: 0.74 mg/dL (ref 0.61–1.24)
Calcium: 8.5 mg/dL — ABNORMAL LOW (ref 8.9–10.3)
GFR calc Af Amer: >60 mL/min (ref >60)
Glucose, Bld: 134 mg/dL — ABNORMAL HIGH (ref 65–99)
POTASSIUM: 4.2 mmol/L (ref 3.5–5.1)
SODIUM: 130 mmol/L — AB (ref 135–145)
Total Bilirubin: 0.4 mg/dL (ref 0.3–1.2)
Total Protein: 7.4 g/dL (ref 6.5–8.1)

## 2015-11-04 LAB — CBC WITH DIFFERENTIAL/PLATELET
Basophils Absolute: 0.1 10*3/uL (ref 0–0.1)
Basophils Relative: 1 %
EOS ABS: 0.1 10*3/uL (ref 0–0.7)
EOS PCT: 1 %
HCT: 31.8 % — ABNORMAL LOW (ref 40.0–52.0)
Hemoglobin: 10.4 g/dL — ABNORMAL LOW (ref 13.0–18.0)
LYMPHS ABS: 0.6 10*3/uL — AB (ref 1.0–3.6)
LYMPHS PCT: 7 %
MCH: 23.7 pg — AB (ref 26.0–34.0)
MCHC: 32.8 g/dL (ref 32.0–36.0)
MCV: 72.2 fL — AB (ref 80.0–100.0)
MONO ABS: 0.8 10*3/uL (ref 0.2–1.0)
Monocytes Relative: 9 %
Neutro Abs: 7.2 10*3/uL — ABNORMAL HIGH (ref 1.4–6.5)
Neutrophils Relative %: 82 %
PLATELETS: 174 10*3/uL (ref 150–440)
RBC: 4.4 MIL/uL (ref 4.40–5.90)
RDW: 18.8 % — AB (ref 11.5–14.5)
WBC: 8.8 10*3/uL (ref 3.8–10.6)

## 2015-11-04 MED ORDER — HEPARIN SOD (PORK) LOCK FLUSH 100 UNIT/ML IV SOLN
500.0000 [IU] | Freq: Once | INTRAVENOUS | Status: AC
  Administered 2015-11-04: 500 [IU] via INTRAVENOUS

## 2015-11-04 MED ORDER — SODIUM CHLORIDE 0.9% FLUSH
10.0000 mL | INTRAVENOUS | Status: DC | PRN
  Administered 2015-11-04: 10 mL via INTRAVENOUS
  Filled 2015-11-04: qty 10

## 2015-11-04 NOTE — Progress Notes (Signed)
States feels "terrible" today. Having body aches all over. Pain medication helps relieve pain. Feels very short of breath at rest with dry cough and wheezing. O2 sats on room air at rest are 87%. O2 improved to 90% on 2L O2 at rest. Pt has currently been using inhalers with no relief in shortness of breath.

## 2015-11-05 ENCOUNTER — Telehealth: Payer: Self-pay | Admitting: *Deleted

## 2015-11-05 ENCOUNTER — Ambulatory Visit: Payer: Medicaid Other

## 2015-11-05 ENCOUNTER — Ambulatory Visit
Admission: RE | Admit: 2015-11-05 | Discharge: 2015-11-05 | Disposition: A | Discharge status: Home / Self Care | Payer: Medicaid Other | Source: Ambulatory Visit | Attending: Radiation Oncology | Admitting: Radiation Oncology

## 2015-11-05 DIAGNOSIS — Z510 Encounter for antineoplastic radiation therapy: Secondary | ICD-10-CM | POA: Diagnosis not present

## 2015-11-05 NOTE — Telephone Encounter (Signed)
Patient notified

## 2015-11-05 NOTE — Telephone Encounter (Signed)
Patient would like xray results.

## 2015-11-06 ENCOUNTER — Ambulatory Visit: Payer: Medicaid Other

## 2015-11-06 ENCOUNTER — Ambulatory Visit
Admission: RE | Admit: 2015-11-06 | Discharge: 2015-11-06 | Disposition: A | Discharge status: Home / Self Care | Payer: Medicaid Other | Source: Ambulatory Visit | Attending: Radiation Oncology | Admitting: Radiation Oncology

## 2015-11-06 ENCOUNTER — Encounter: Payer: Self-pay | Admitting: Cardiothoracic Surgery

## 2015-11-06 ENCOUNTER — Ambulatory Visit (INDEPENDENT_AMBULATORY_CARE_PROVIDER_SITE_OTHER): Payer: Self-pay | Admitting: Cardiothoracic Surgery

## 2015-11-06 VITALS — BP 149/79 | HR 116 | Temp 98.0°F | Resp 22 | Ht 75.0 in | Wt 164.4 lb

## 2015-11-06 DIAGNOSIS — Z510 Encounter for antineoplastic radiation therapy: Secondary | ICD-10-CM | POA: Diagnosis not present

## 2015-11-06 DIAGNOSIS — C3411 Malignant neoplasm of upper lobe, right bronchus or lung: Secondary | ICD-10-CM

## 2015-11-06 NOTE — Progress Notes (Signed)
He returns today in follow-up. He is now about a week out from his Port-A-Cath. He has no complaints. He was scheduled to get his chemotherapy but Dr. Grayland Ormond held off on that because of some sputum production. Today on physical exam his port site looks great. There is no erythema drainage or discharge. His chest x-ray shows the port to be in good position. We will remove the sutures today. He'll come back to see [place] on an as-needed basis.

## 2015-11-06 NOTE — Patient Instructions (Signed)
Please call with any questions or concerns.

## 2015-11-07 ENCOUNTER — Encounter: Payer: Self-pay | Admitting: Emergency Medicine

## 2015-11-07 ENCOUNTER — Observation Stay
Admission: EM | Admit: 2015-11-07 | Discharge: 2015-11-09 | Disposition: A | Discharge status: Home / Self Care | Payer: Medicaid Other | Attending: Internal Medicine | Admitting: Internal Medicine

## 2015-11-07 ENCOUNTER — Emergency Department: Payer: Medicaid Other

## 2015-11-07 DIAGNOSIS — Z7901 Long term (current) use of anticoagulants: Secondary | ICD-10-CM | POA: Diagnosis not present

## 2015-11-07 DIAGNOSIS — I82409 Acute embolism and thrombosis of unspecified deep veins of unspecified lower extremity: Secondary | ICD-10-CM | POA: Diagnosis present

## 2015-11-07 DIAGNOSIS — Z79899 Other long term (current) drug therapy: Secondary | ICD-10-CM | POA: Diagnosis not present

## 2015-11-07 DIAGNOSIS — Z886 Allergy status to analgesic agent status: Secondary | ICD-10-CM | POA: Insufficient documentation

## 2015-11-07 DIAGNOSIS — J449 Chronic obstructive pulmonary disease, unspecified: Secondary | ICD-10-CM | POA: Diagnosis present

## 2015-11-07 DIAGNOSIS — J440 Chronic obstructive pulmonary disease with acute lower respiratory infection: Secondary | ICD-10-CM | POA: Insufficient documentation

## 2015-11-07 DIAGNOSIS — K4020 Bilateral inguinal hernia, without obstruction or gangrene, not specified as recurrent: Secondary | ICD-10-CM | POA: Diagnosis not present

## 2015-11-07 DIAGNOSIS — R269 Unspecified abnormalities of gait and mobility: Secondary | ICD-10-CM | POA: Insufficient documentation

## 2015-11-07 DIAGNOSIS — C3411 Malignant neoplasm of upper lobe, right bronchus or lung: Secondary | ICD-10-CM | POA: Diagnosis not present

## 2015-11-07 DIAGNOSIS — K769 Liver disease, unspecified: Secondary | ICD-10-CM | POA: Insufficient documentation

## 2015-11-07 DIAGNOSIS — I4891 Unspecified atrial fibrillation: Secondary | ICD-10-CM | POA: Insufficient documentation

## 2015-11-07 DIAGNOSIS — I509 Heart failure, unspecified: Secondary | ICD-10-CM | POA: Insufficient documentation

## 2015-11-07 DIAGNOSIS — I82432 Acute embolism and thrombosis of left popliteal vein: Secondary | ICD-10-CM | POA: Diagnosis not present

## 2015-11-07 DIAGNOSIS — M1990 Unspecified osteoarthritis, unspecified site: Secondary | ICD-10-CM | POA: Diagnosis not present

## 2015-11-07 DIAGNOSIS — Z87891 Personal history of nicotine dependence: Secondary | ICD-10-CM | POA: Diagnosis not present

## 2015-11-07 DIAGNOSIS — Z9981 Dependence on supplemental oxygen: Secondary | ICD-10-CM | POA: Diagnosis not present

## 2015-11-07 DIAGNOSIS — Z9221 Personal history of antineoplastic chemotherapy: Secondary | ICD-10-CM | POA: Diagnosis not present

## 2015-11-07 DIAGNOSIS — I824Y3 Acute embolism and thrombosis of unspecified deep veins of proximal lower extremity, bilateral: Secondary | ICD-10-CM | POA: Diagnosis present

## 2015-11-07 DIAGNOSIS — N189 Chronic kidney disease, unspecified: Secondary | ICD-10-CM | POA: Diagnosis present

## 2015-11-07 DIAGNOSIS — I82412 Acute embolism and thrombosis of left femoral vein: Secondary | ICD-10-CM | POA: Diagnosis not present

## 2015-11-07 DIAGNOSIS — R531 Weakness: Secondary | ICD-10-CM | POA: Diagnosis not present

## 2015-11-07 DIAGNOSIS — I82492 Acute embolism and thrombosis of other specified deep vein of left lower extremity: Secondary | ICD-10-CM | POA: Insufficient documentation

## 2015-11-07 DIAGNOSIS — I130 Hypertensive heart and chronic kidney disease with heart failure and stage 1 through stage 4 chronic kidney disease, or unspecified chronic kidney disease: Secondary | ICD-10-CM | POA: Insufficient documentation

## 2015-11-07 DIAGNOSIS — R262 Difficulty in walking, not elsewhere classified: Secondary | ICD-10-CM | POA: Insufficient documentation

## 2015-11-07 DIAGNOSIS — Z923 Personal history of irradiation: Secondary | ICD-10-CM | POA: Diagnosis not present

## 2015-11-07 DIAGNOSIS — I10 Essential (primary) hypertension: Secondary | ICD-10-CM | POA: Diagnosis present

## 2015-11-07 LAB — BASIC METABOLIC PANEL
ANION GAP: 11 (ref 5–15)
BUN: 14 mg/dL (ref 6–20)
CALCIUM: 8.7 mg/dL — AB (ref 8.9–10.3)
CO2: 25 mmol/L (ref 22–32)
Chloride: 94 mmol/L — ABNORMAL LOW (ref 101–111)
Creatinine, Ser: 0.72 mg/dL (ref 0.61–1.24)
GLUCOSE: 170 mg/dL — AB (ref 65–99)
POTASSIUM: 4.1 mmol/L (ref 3.5–5.1)
Sodium: 130 mmol/L — ABNORMAL LOW (ref 135–145)

## 2015-11-07 LAB — APTT: APTT: 34 s (ref 24–36)

## 2015-11-07 LAB — CBC
HCT: 30.4 % — ABNORMAL LOW (ref 40.0–52.0)
Hemoglobin: 9.8 g/dL — ABNORMAL LOW (ref 13.0–18.0)
MCH: 23.5 pg — ABNORMAL LOW (ref 26.0–34.0)
MCHC: 32.2 g/dL (ref 32.0–36.0)
MCV: 72.9 fL — ABNORMAL LOW (ref 80.0–100.0)
PLATELETS: 178 10*3/uL (ref 150–440)
RBC: 4.17 MIL/uL — ABNORMAL LOW (ref 4.40–5.90)
RDW: 19.4 % — AB (ref 11.5–14.5)
WBC: 11.3 10*3/uL — AB (ref 3.8–10.6)

## 2015-11-07 LAB — PROTIME-INR
INR: 1.27
PROTHROMBIN TIME: 16 s — AB (ref 11.4–15.0)

## 2015-11-07 MED ORDER — HEPARIN BOLUS VIA INFUSION
4400.0000 [IU] | Freq: Once | INTRAVENOUS | Status: AC
  Administered 2015-11-07: 4400 [IU] via INTRAVENOUS
  Filled 2015-11-07: qty 4400

## 2015-11-07 MED ORDER — ACETAMINOPHEN 650 MG RE SUPP: 650.0000 mg | Freq: Four times a day (QID) | RECTAL | Status: DC | PRN

## 2015-11-07 MED ORDER — ACETAMINOPHEN 325 MG PO TABS: 650.0000 mg | ORAL_TABLET | Freq: Four times a day (QID) | ORAL | Status: DC | PRN

## 2015-11-07 MED ORDER — MORPHINE SULFATE (PF) 2 MG/ML IV SOLN
2.0000 mg | INTRAVENOUS | Status: DC | PRN
  Administered 2015-11-07 – 2015-11-09 (×6): 2 mg via INTRAVENOUS
  Filled 2015-11-07 (×6): qty 1

## 2015-11-07 MED ORDER — PROCHLORPERAZINE MALEATE 10 MG PO TABS: 10.0000 mg | ORAL_TABLET | Freq: Four times a day (QID) | ORAL | Status: DC | PRN

## 2015-11-07 MED ORDER — HYDROCODONE-ACETAMINOPHEN 5-325 MG PO TABS
1.0000 | ORAL_TABLET | ORAL | Status: DC | PRN
  Administered 2015-11-08 – 2015-11-09 (×4): 1 via ORAL
  Filled 2015-11-07 (×5): qty 1

## 2015-11-07 MED ORDER — ALBUTEROL SULFATE (2.5 MG/3ML) 0.083% IN NEBU
2.5000 mg | INHALATION_SOLUTION | RESPIRATORY_TRACT | Status: DC | PRN
  Administered 2015-11-07: 2.5 mg via RESPIRATORY_TRACT
  Filled 2015-11-07: qty 3

## 2015-11-07 MED ORDER — BENZONATATE 100 MG PO CAPS: 200.0000 mg | ORAL_CAPSULE | Freq: Three times a day (TID) | ORAL | Status: DC | PRN

## 2015-11-07 MED ORDER — ONDANSETRON HCL 4 MG PO TABS: 8.0000 mg | ORAL_TABLET | Freq: Two times a day (BID) | ORAL | Status: DC | PRN

## 2015-11-07 MED ORDER — HEPARIN (PORCINE) IN NACL 100-0.45 UNIT/ML-% IJ SOLN
17.0000 [IU]/kg/h | INTRAMUSCULAR | Status: DC
  Administered 2015-11-07: 17 [IU]/kg/h via INTRAVENOUS
  Filled 2015-11-07: qty 250

## 2015-11-07 MED ORDER — ALBUTEROL SULFATE HFA 108 (90 BASE) MCG/ACT IN AERS: 1.0000 | INHALATION_SPRAY | RESPIRATORY_TRACT | Status: DC | PRN

## 2015-11-07 NOTE — ED Notes (Signed)
Dr Archie Balboa in to followup with pt and notify of admission

## 2015-11-07 NOTE — ED Notes (Signed)
Pt still in US;wife and sister-in-law now in room waiting patiently for his return

## 2015-11-07 NOTE — ED Notes (Addendum)
Patient transported to Ultrasound via stretcher by Butch Penny; introduced self to pt in route;

## 2015-11-07 NOTE — Progress Notes (Signed)
ANTICOAGULATION CONSULT NOTE - Initial Consult  Pharmacy Consult for Heparin Indication: DVT  Allergies  Allergen Reactions  . Aleve [Naproxen Sodium] Hives, Shortness Of Breath and Swelling    Naproxin    Patient Measurements: Height: '6\' 3"'$  (190.5 cm) Weight: 163 lb (73.936 kg) IBW/kg (Calculated) : 84.5 Heparin Dosing Weight: 73.9 kg  Vital Signs: Temp: 98.2 F (36.8 C) (07/01 2056) Temp Source: Oral (07/01 2056) BP: 138/79 mmHg (07/01 2056) Pulse Rate: 103 (07/01 2056)  Labs:  Recent Labs  11/07/15 1826 11/07/15 1853  HGB  --  9.8*  HCT  --  30.4*  PLT  --  178  APTT  --  34  LABPROT  --  16.0*  INR  --  1.27  CREATININE 0.72  --     Estimated Creatinine Clearance: 97.5 mL/min (by C-G formula based on Cr of 0.72).   Medical History: Past Medical History  Diagnosis Date  . Atrial fibrillation ([place])   . CHF (congestive heart failure) ([place])   . Hypertension   . Disease of lung   . COPD (chronic obstructive pulmonary disease) ([place])   . Pneumonia   . Chronic kidney disease   . Cancer ([place]) 2017    Lung  . Dysrhythmia   . Headache   . Arthritis   . History of chemotherapy   . History of radiation therapy     Medications:   (Not in a hospital admission) Scheduled:  . heparin  4,400 Units Intravenous Once   Infusions:  . heparin      Assessment: 65 y/o M admitted with bilateral DVT. No anticoagulants PTA per history and per RN.   Goal of Therapy:  Heparin level 0.3-0.7 units/ml Monitor platelets by anticoagulation protocol: Yes   Plan:  Give 4400 units bolus x 1 Start heparin infusion at 1250 units/hr Check anti-Xa level in 6 hours and daily while on heparin Continue to monitor H&H and platelets  Ulice Dash D 11/07/2015,9:16 PM

## 2015-11-07 NOTE — ED Provider Notes (Signed)
[facility] Emergency Department Provider Note  ____________________________________________  Time seen: ~1745  I have reviewed the triage vital signs and the nursing notes.   HISTORY  Chief Complaint Leg Pain   History limited by: Not Limited   HPI Sean Hall is a 65 y.o. male who is currently undergoing treatment for lung cancer presents to the emergency department today because of concerns for left leg pain and swelling. Patient says that the pain started yesterday. This morning the pain was more severe. It is located behind his left calf and towards his left foot. It is worse with walking. Today the patient also noticed swelling in that leg. Called the on-call physician who was concerned for possible blood clot. Patient denies any new shortness of breath or chest pain. Denies any recent fevers.   Past Medical History  Diagnosis Date  . Atrial fibrillation ([place])   . CHF (congestive heart failure) ([place])   . Hypertension   . Disease of lung   . COPD (chronic obstructive pulmonary disease) ([place])   . Pneumonia   . Chronic kidney disease   . Cancer ([place]) 2017    Lung  . Dysrhythmia   . Headache   . Arthritis   . History of chemotherapy   . History of radiation therapy     Patient Active Problem List   Diagnosis Date Noted  . Lung cancer ([place]) 10/17/2015  . Disease of lung 11/25/2013  . BP (high blood pressure) 11/25/2013  . Atrial fibrillation ([place]) 11/22/2013  . Congestive heart failure ([place]) 11/22/2013  . Chronic kidney disease 11/22/2013  . Chronic obstructive pulmonary disease ([place]) 11/22/2013  . PNA (pneumonia) 11/22/2013    Past Surgical History  Procedure Laterality Date  . Knee surgery Bilateral   . Tonsillectomy    . Portacath placement N/A 10/29/2015    Procedure: INSERTION PORT-A-CATH;  Surgeon: Nestor Lewandowsky, MD;  Location: [facility] ORS;  Service: General;  Laterality: N/A;    Current Outpatient Rx  Name  Route  Sig  Dispense   Refill  . albuterol (PROAIR HFA) 108 (90 Base) MCG/ACT inhaler   Inhalation   Inhale 1-2 puffs into the lungs every 4 (four) hours as needed.   1 Inhaler   0   . benzonatate (TESSALON) 200 MG capsule   Oral   Take 200 mg by mouth 3 (three) times daily as needed for cough.         Marland Kitchen HYDROcodone-acetaminophen (NORCO/VICODIN) 5-325 MG tablet   Oral   Take 1 tablet by mouth every 4 (four) hours as needed for moderate pain.   60 tablet   0   . lidocaine-prilocaine (EMLA) cream      Apply to affected area once   30 g   3   . Multiple Vitamin (MULTI-VITAMINS) TABS   Oral   Take 1 tablet by mouth daily.          . ondansetron (ZOFRAN) 8 MG tablet   Oral   Take 1 tablet (8 mg total) by mouth 2 (two) times daily as needed for refractory nausea / vomiting. Start on day 3 after chemo.   30 tablet   1     Please fill on the [employer].   . prochlorperazine (COMPAZINE) 10 MG tablet   Oral   Take 1 tablet (10 mg total) by mouth every 6 (six) hours as needed (Nausea or vomiting).   30 tablet   1     Please fill on the  [employer].     Allergies Aleve  Family History  Problem Relation Age of Onset  . Diabetes Father   . Hypertension Father     Social History Social History  Substance Use Topics  . Smoking status: Former Smoker -- 2.00 packs/day for 50 years    Types: Cigarettes    Quit date: 08/07/2013  . Smokeless tobacco: Never Used  . Alcohol Use: 0.0 oz/week    0 Standard drinks or equivalent per week     Comment: rarely    Review of Systems  Constitutional: Negative for fever. Cardiovascular: Negative for chest pain. Respiratory: Negative for shortness of breath. Gastrointestinal: Negative for abdominal pain, vomiting and diarrhea. Genitourinary: Negative for dysuria. Musculoskeletal: Negative for back pain.Positive for left leg swelling and pain Skin: Negative for rash. Neurological: Negative for headaches, focal weakness or numbness.   10-point  ROS otherwise negative.  ____________________________________________   PHYSICAL EXAM:  VITAL SIGNS: ED Triage Vitals  Enc Vitals Group     BP 11/07/15 1718 154/86 mmHg     Pulse Rate 11/07/15 1718 115     Resp 11/07/15 1718 20     Temp 11/07/15 1718 98.8 F (37.1 C)     Temp Source 11/07/15 1718 Oral     SpO2 11/07/15 1718 97 %     Weight 11/07/15 1718 163 lb (73.936 kg)     Height 11/07/15 1718 '6\' 3"'$  (1.905 m)     Head Cir --      Peak Flow --      Pain Score 11/07/15 1721 10   Constitutional: Alert and oriented. Well appearing and in no distress. Eyes: Conjunctivae are normal. PERRL. Normal extraocular movements. ENT   Head: Normocephalic and atraumatic.   Nose: No congestion/rhinnorhea.   Mouth/Throat: Mucous membranes are moist.   Neck: No stridor. Hematological/Lymphatic/Immunilogical: No cervical lymphadenopathy. Cardiovascular: Normal rate, regular rhythm.  No murmurs, rubs, or gallops. Respiratory: Normal respiratory effort without tachypnea nor retractions. Breath sounds are clear and equal bilaterally. No wheezes/rales/rhonchi. Gastrointestinal: Soft and nontender. No distention. There is no CVA tenderness. Genitourinary: Deferred Musculoskeletal: Normal range of motion in all extremities. No joint effusions.  Left leg with swelling around the catheter. Mild tenderness. No warmth or erythema. Neurologic:  Normal speech and language. No gross focal neurologic deficits are appreciated.  Skin:  Skin is warm, dry and intact. No rash noted. Psychiatric: Mood and affect are normal. Speech and behavior are normal. Patient exhibits appropriate insight and judgment.  ____________________________________________    LABS (pertinent positives/negatives)  Labs Reviewed  BASIC METABOLIC PANEL - Abnormal; Notable for the following:    Sodium 130 (*)    Chloride 94 (*)    Glucose, Bld 170 (*)    Calcium 8.7 (*)    All other components within normal limits   CBC - Abnormal; Notable for the following:    WBC 11.3 (*)    RBC 4.17 (*)    Hemoglobin 9.8 (*)    HCT 30.4 (*)    MCV 72.9 (*)    MCH 23.5 (*)    RDW 19.4 (*)    All other components within normal limits  PROTIME-INR - Abnormal; Notable for the following:    Prothrombin Time 16.0 (*)    All other components within normal limits  APTT  CBC     ____________________________________________   EKG  None  ____________________________________________    RADIOLOGY  US venous lower extremity IMPRESSION: Study is positive for deep venous thrombosis bilaterally.  Nonocclusive thrombus is identified in the left common femoral vein with occlusive thrombus extending down the left femoral, profunda, popliteal, and calf veins.  Nonocclusive thrombus identified in the contralateral (right) common femoral vein.  ____________________________________________   PROCEDURES  Procedure(s) performed: None  Critical Care performed: No  ____________________________________________   INITIAL IMPRESSION / ASSESSMENT AND PLAN / ED COURSE  Pertinent labs & imaging results that were available during my care of the patient were reviewed by me and considered in my medical decision making (see chart for details).  Patient with history of lung cancer who presents to the emergency department today because of concerns for left leg pain and swelling. Exam does show left leg swelling and some mild tenderness. Given this finding do have concerns for blood clot especially given history of cancer. Will obtain ultrasound.  ----------------------------------------- 9:22 PM on 11/07/2015 -----------------------------------------  Ultrasound positive for bilateral DVTs. Given the extensive nature of DVT think patient would benefit best from inpatient hospitalization and IV anticoagulation. Patient was written for IV heparin. Will admit to the hospitalist  service.  ____________________________________________   FINAL CLINICAL IMPRESSION(S) / ED DIAGNOSES  Final diagnoses:  Deep vein thrombosis (DVT) of proximal vein of both lower extremities, unspecified chronicity ([place])     Note: This dictation was prepared with Dragon dictation. Any transcriptional errors that result from this process are unintentional    Nance Pear, MD 11/07/15 2123

## 2015-11-07 NOTE — H&P (Signed)
[facility] [place] NAME: Pavan Bring    MR#:  [id number]  DATE OF BIRTH:  09/07/1950  DATE OF ADMISSION:  11/07/2015  PRIMARY CARE PHYSICIAN: No PCP Per Patient   REQUESTING/REFERRING PHYSICIAN: Archie Balboa, M.D.  CHIEF COMPLAINT:   Chief Complaint  Patient presents with  . Leg Pain    HISTORY OF PRESENT ILLNESS:  Sean Hall  is a 65 y.o. male who presents with Left leg pain for 1 day. Patient has a known history of lung cancer and is currently being treated with chemotherapy and radiation. This morning when he woke up he had left leg pain. The pain persisted throughout the day, and so he called his oncologist office and they told him to come to the ED for evaluation. Here he was found on ultrasound to have an extensive left lower extremity DVT and a much smaller right lower extremity DVT. Given the size of his left lower showing a DVT ED physician felt he would be appropriate for him to be admitted for initiation of anticoagulation inpatient. Hospitalists were called for the same.  PAST MEDICAL HISTORY:   Past Medical History  Diagnosis Date  . Atrial fibrillation ([place])   . CHF (congestive heart failure) ([place])   . Hypertension   . Disease of lung   . COPD (chronic obstructive pulmonary disease) ([place])   . Pneumonia   . Chronic kidney disease   . Cancer ([place]) 2017    Lung  . Dysrhythmia   . Headache   . Arthritis   . History of chemotherapy   . History of radiation therapy     PAST SURGICAL HISTORY:   Past Surgical History  Procedure Laterality Date  . Knee surgery Bilateral   . Tonsillectomy    . Portacath placement N/A 10/29/2015    Procedure: INSERTION PORT-A-CATH;  Surgeon: Nestor Lewandowsky, MD;  Location: [facility] ORS;  Service: General;  Laterality: N/A;    SOCIAL HISTORY:   Social History  Substance Use Topics  . Smoking status: Former Smoker -- 2.00 packs/day for 50 years    Types: Cigarettes    Quit date:  08/07/2013  . Smokeless tobacco: Never Used  . Alcohol Use: 0.0 oz/week    0 Standard drinks or equivalent per week     Comment: rarely    FAMILY HISTORY:   Family History  Problem Relation Age of Onset  . Diabetes Father   . Hypertension Father     DRUG ALLERGIES:   Allergies  Allergen Reactions  . Aleve [Naproxen Sodium] Hives, Shortness Of Breath and Swelling    Naproxin    MEDICATIONS AT HOME:   Prior to Admission medications   Medication Sig Start Date End Date Taking? Authorizing Provider  albuterol (PROAIR HFA) 108 (90 Base) MCG/ACT inhaler Inhale 1-2 puffs into the lungs every 4 (four) hours as needed. 10/28/15   Lloyd Huger, MD  benzonatate (TESSALON) 200 MG capsule Take 200 mg by mouth 3 (three) times daily as needed for cough.    Historical Provider, MD  HYDROcodone-acetaminophen (NORCO/VICODIN) 5-325 MG tablet Take 1 tablet by mouth every 4 (four) hours as needed for moderate pain. 10/28/15   Lloyd Huger, MD  lidocaine-prilocaine (EMLA) cream Apply to affected area once 10/28/15   Lloyd Huger, MD  Multiple Vitamin (MULTI-VITAMINS) TABS Take 1 tablet by mouth daily.     Historical Provider, MD  ondansetron (ZOFRAN) 8 MG tablet Take 1 tablet (8 mg  total) by mouth 2 (two) times daily as needed for refractory nausea / vomiting. Start on day 3 after chemo. 10/20/15   Lloyd Huger, MD  prochlorperazine (COMPAZINE) 10 MG tablet Take 1 tablet (10 mg total) by mouth every 6 (six) hours as needed (Nausea or vomiting). 10/20/15   Lloyd Huger, MD    REVIEW OF SYSTEMS:  Review of Systems  Constitutional: Negative for fever, chills, weight loss and malaise/fatigue.  HENT: Negative for ear pain, hearing loss and tinnitus.   Eyes: Negative for blurred vision, double vision, pain and redness.  Respiratory: Negative for cough, hemoptysis and shortness of breath.   Cardiovascular: Negative for chest pain, palpitations, orthopnea and leg swelling.   Gastrointestinal: Negative for nausea, vomiting, abdominal pain, diarrhea and constipation.  Genitourinary: Negative for dysuria, frequency and hematuria.  Musculoskeletal: Negative for back pain, joint pain and neck pain.       Left lower extremity pain  Skin:       No acne, rash, or lesions  Neurological: Negative for dizziness, tremors, focal weakness and weakness.  Endo/Heme/Allergies: Negative for polydipsia. Does not bruise/bleed easily.  Psychiatric/Behavioral: Negative for depression. The patient is not nervous/anxious and does not have insomnia.      VITAL SIGNS:   Filed Vitals:   11/07/15 1718 11/07/15 1757 11/07/15 2056  BP: 154/86 168/81 138/79  Pulse: 115 102 103  Temp: 98.8 F (37.1 C)  98.2 F (36.8 C)  TempSrc: Oral  Oral  Resp: 20 185 22  Height: '6\' 3"'$  (1.905 m)    Weight: 73.936 kg (163 lb)    SpO2: 97% 99% 98%   Wt Readings from Last 3 Encounters:  11/07/15 73.936 kg (163 lb)  11/06/15 74.571 kg (164 lb 6.4 oz)  11/04/15 73.95 kg (163 lb 0.5 oz)    PHYSICAL EXAMINATION:  Physical Exam  Vitals reviewed. Constitutional: He is oriented to person, place, and time. He appears well-developed and well-nourished. No distress.  HENT:  Head: Normocephalic and atraumatic.  Mouth/Throat: Oropharynx is clear and moist.  Eyes: Conjunctivae and EOM are normal. Pupils are equal, round, and reactive to light. No scleral icterus.  Neck: Normal range of motion. Neck supple. No JVD present. No thyromegaly present.  Cardiovascular: Normal rate, regular rhythm and intact distal pulses.  Exam reveals no gallop and no friction rub.   No murmur heard. Respiratory: Effort normal. No respiratory distress. He has wheezes. He has no rales.  GI: Soft. Bowel sounds are normal. He exhibits no distension. There is no tenderness.  Musculoskeletal: Normal range of motion. He exhibits tenderness (left lower extremity tenderness especially in his, positive Homans sign). He exhibits no  edema.  No arthritis, no gout  Lymphadenopathy:    He has no cervical adenopathy.  Neurological: He is alert and oriented to person, place, and time. No cranial nerve deficit.  No dysarthria, no aphasia  Skin: Skin is warm and dry. No rash noted. No erythema.  Psychiatric: He has a normal mood and affect. His behavior is normal. Judgment and thought content normal.    LABORATORY PANEL:   CBC  Recent Labs Lab 11/07/15 1853  WBC 11.3*  HGB 9.8*  HCT 30.4*  PLT 178   ------------------------------------------------------------------------------------------------------------------  Chemistries   Recent Labs Lab 11/04/15 0813 11/07/15 1826  NA 130* 130*  K 4.2 4.1  CL 96* 94*  CO2 26 25  GLUCOSE 134* 170*  BUN 12 14  CREATININE 0.74 0.72  CALCIUM 8.5* 8.7*  AST 15  --  ALT 15*  --   ALKPHOS 162*  --   BILITOT 0.4  --    ------------------------------------------------------------------------------------------------------------------  Cardiac Enzymes No results for input(s): TROPONINI in the last 168 hours. ------------------------------------------------------------------------------------------------------------------  RADIOLOGY:  US Venous Img Lower Unilateral Left  11/07/2015  CLINICAL DATA:  Left leg swelling since yesterday. EXAM: Left LOWER EXTREMITY VENOUS DOPPLER ULTRASOUND TECHNIQUE: Gray-scale sonography with graded compression, as well as color Doppler and duplex ultrasound were performed to evaluate the lower extremity deep venous systems from the level of the common femoral vein and including the common femoral, femoral, profunda femoral, popliteal and calf veins including the posterior tibial, peroneal and gastrocnemius veins when visible. The superficial great saphenous vein was also interrogated. Spectral Doppler was utilized to evaluate flow at rest and with distal augmentation maneuvers in the common femoral, femoral and popliteal veins. COMPARISON:   None. FINDINGS: Contralateral Common Femoral Vein: Nonocclusive thrombus. Common Femoral Vein: Nonocclusive thrombus. Saphenofemoral Junction: Nonocclusive thrombus Profunda Femoral Vein: Occlusive thrombus. Femoral Vein: Occlusive thrombus Popliteal Vein: Occlusive thrombus Calf Veins: Occlusive thrombus Superficial Great Saphenous Vein: Nonocclusive thrombus. IMPRESSION: Study is positive for deep venous thrombosis bilaterally. Nonocclusive thrombus is identified in the left common femoral vein with occlusive thrombus extending down the left femoral, profunda, popliteal, and calf veins. Nonocclusive thrombus identified in the contralateral (right) common femoral vein. Critical Value/emergent results were called by me at the time of interpretation on 11/07/2015 at 9:07 pm to Dr. Marcelene Butte, who verbally acknowledged these results. Electronically Signed   By: Misty Stanley M.D.   On: 11/07/2015 21:08    EKG:   Orders placed or performed during the hospital encounter of 10/27/15  . EKG 12-Lead  . EKG 12-Lead    IMPRESSION AND PLAN:  Principal Problem:   DVT (deep venous thrombosis) (HCC) - anticoagulation initiated in the ED, we'll continues on admission. Given that this is formation of a DVT in the setting of lung cancer, we will consult oncology for any further recommendations. Active Problems:   Chronic obstructive pulmonary disease (HCC) - continue home meds including home inhaler   BP (high blood pressure) - continue home meds, currently stable   Lung cancer ([place]) - likely the cause of his DVT formation, consult oncology as above in addition to treating DVT as above   Chronic kidney disease - currently stable, avoid nephrotoxins  All the records are reviewed and case discussed with ED provider. Management plans discussed with the patient and/or family.  DVT PROPHYLAXIS: Systemic anticoagulation  GI PROPHYLAXIS: None  ADMISSION STATUS: Observation  CODE STATUS: Full Code Status History     This patient does not have a recorded code status. Please follow your organizational policy for patients in this situation.      TOTAL TIME TAKING CARE OF THIS PATIENT: 40 minutes.    Latashia Koch FIELDING 11/07/2015, 10:03 PM  [facility]  Office  [phone]  CC: Primary care physician; No PCP Per Patient

## 2015-11-07 NOTE — ED Notes (Signed)
Pt back from US

## 2015-11-07 NOTE — ED Notes (Signed)
Pt says he has a port-a-cath but when he was at the doctor's office this past week they were unable to access it; pt has a peripheral IV that was placed upon arrival

## 2015-11-07 NOTE — ED Notes (Signed)
Admitting MD at bedside.

## 2015-11-07 NOTE — ED Notes (Signed)
Pain noted L leg noted yesterday, noted edema same leg this am.

## 2015-11-07 NOTE — ED Notes (Signed)
Breathing treatment complete; pt says feeling better;

## 2015-11-08 DIAGNOSIS — I509 Heart failure, unspecified: Secondary | ICD-10-CM

## 2015-11-08 DIAGNOSIS — C3490 Malignant neoplasm of unspecified part of unspecified bronchus or lung: Secondary | ICD-10-CM

## 2015-11-08 DIAGNOSIS — I4891 Unspecified atrial fibrillation: Secondary | ICD-10-CM

## 2015-11-08 DIAGNOSIS — I82402 Acute embolism and thrombosis of unspecified deep veins of left lower extremity: Secondary | ICD-10-CM

## 2015-11-08 DIAGNOSIS — J449 Chronic obstructive pulmonary disease, unspecified: Secondary | ICD-10-CM

## 2015-11-08 DIAGNOSIS — N189 Chronic kidney disease, unspecified: Secondary | ICD-10-CM

## 2015-11-08 DIAGNOSIS — M129 Arthropathy, unspecified: Secondary | ICD-10-CM

## 2015-11-08 DIAGNOSIS — Z87891 Personal history of nicotine dependence: Secondary | ICD-10-CM

## 2015-11-08 DIAGNOSIS — I129 Hypertensive chronic kidney disease with stage 1 through stage 4 chronic kidney disease, or unspecified chronic kidney disease: Secondary | ICD-10-CM

## 2015-11-08 DIAGNOSIS — I82401 Acute embolism and thrombosis of unspecified deep veins of right lower extremity: Secondary | ICD-10-CM

## 2015-11-08 DIAGNOSIS — Z8701 Personal history of pneumonia (recurrent): Secondary | ICD-10-CM

## 2015-11-08 DIAGNOSIS — Z923 Personal history of irradiation: Secondary | ICD-10-CM

## 2015-11-08 DIAGNOSIS — Z9221 Personal history of antineoplastic chemotherapy: Secondary | ICD-10-CM

## 2015-11-08 LAB — BASIC METABOLIC PANEL
ANION GAP: 7 (ref 5–15)
BUN: 14 mg/dL (ref 6–20)
CHLORIDE: 97 mmol/L — AB (ref 101–111)
CO2: 27 mmol/L (ref 22–32)
Calcium: 8.3 mg/dL — ABNORMAL LOW (ref 8.9–10.3)
Creatinine, Ser: 0.68 mg/dL (ref 0.61–1.24)
GFR calc Af Amer: >60 mL/min (ref >60)
Glucose, Bld: 167 mg/dL — ABNORMAL HIGH (ref 65–99)
POTASSIUM: 3.7 mmol/L (ref 3.5–5.1)
SODIUM: 131 mmol/L — AB (ref 135–145)

## 2015-11-08 LAB — CBC
HEMATOCRIT: 28.7 % — AB (ref 40.0–52.0)
Hemoglobin: 9.6 g/dL — ABNORMAL LOW (ref 13.0–18.0)
MCH: 24.1 pg — AB (ref 26.0–34.0)
MCHC: 33.4 g/dL (ref 32.0–36.0)
MCV: 72.1 fL — ABNORMAL LOW (ref 80.0–100.0)
Platelets: 183 10*3/uL (ref 150–440)
RBC: 3.97 MIL/uL — ABNORMAL LOW (ref 4.40–5.90)
RDW: 19.5 % — ABNORMAL HIGH (ref 11.5–14.5)
WBC: 10.9 10*3/uL — ABNORMAL HIGH (ref 3.8–10.6)

## 2015-11-08 LAB — HEPARIN LEVEL (UNFRACTIONATED)
HEPARIN UNFRACTIONATED: 0.11 [IU]/mL — AB (ref 0.30–0.70)
Heparin Unfractionated: <0.1 IU/mL — ABNORMAL LOW (ref 0.30–0.70)
Heparin Unfractionated: 0.14 IU/mL — ABNORMAL LOW (ref 0.30–0.70)

## 2015-11-08 MED ORDER — HEPARIN BOLUS VIA INFUSION
2200.0000 [IU] | Freq: Once | INTRAVENOUS | Status: AC
  Administered 2015-11-08: 2200 [IU] via INTRAVENOUS
  Filled 2015-11-08: qty 2200

## 2015-11-08 MED ORDER — HEPARIN BOLUS VIA INFUSION
2250.0000 [IU] | Freq: Once | INTRAVENOUS | Status: AC
  Administered 2015-11-08: 2250 [IU] via INTRAVENOUS
  Filled 2015-11-08: qty 2250

## 2015-11-08 MED ORDER — CETYLPYRIDINIUM CHLORIDE 0.05 % MT LIQD
7.0000 mL | Freq: Two times a day (BID) | OROMUCOSAL | Status: DC
  Administered 2015-11-08 – 2015-11-09 (×3): 7 mL via OROMUCOSAL

## 2015-11-08 MED ORDER — HEPARIN (PORCINE) IN NACL 100-0.45 UNIT/ML-% IJ SOLN
1900.0000 [IU]/h | INTRAMUSCULAR | Status: DC
  Administered 2015-11-08 (×2): 1400 [IU]/h via INTRAVENOUS
  Filled 2015-11-08 (×4): qty 250

## 2015-11-08 NOTE — Progress Notes (Signed)
Patient ID: Sean Hall, male   DOB: 1950-10-04, 65 y.o.   MRN: [id number] [facility] [place] NAME: Sean Hall    MR#:  [id number]  DATE OF BIRTH:  1950/08/17  SUBJECTIVE:  Came in with leg leg pain and swelling. Now on heparin gtt for left LE DVT REVIEW OF SYSTEMS:   Review of Systems  Constitutional: Negative for fever, chills and weight loss.  HENT: Negative for ear discharge, ear pain and nosebleeds.   Eyes: Negative for blurred vision, pain and discharge.  Respiratory: Positive for shortness of breath. Negative for sputum production, wheezing and stridor.   Cardiovascular: Negative for chest pain, palpitations, orthopnea and PND.  Gastrointestinal: Negative for nausea, vomiting, abdominal pain, diarrhea, blood in stool and melena.  Genitourinary: Negative for urgency and frequency.  Musculoskeletal: Positive for myalgias. Negative for back pain and joint pain.  Neurological: Positive for weakness. Negative for sensory change, speech change and focal weakness.  Psychiatric/Behavioral: Negative for depression and hallucinations. The patient is not nervous/anxious.   All other systems reviewed and are negative.  Tolerating Diet:yes Tolerating PT: pending  DRUG ALLERGIES:   Allergies  Allergen Reactions  . Aleve [Naproxen Sodium] Hives, Shortness Of Breath and Swelling    Naproxin    VITALS:  Blood pressure 144/67, pulse 100, temperature 97.8 F (36.6 C), temperature source Oral, resp. rate 18, height '6\' 3"'$  (1.905 m), weight 73.936 kg (163 lb), SpO2 96 %.  PHYSICAL EXAMINATION:   Physical Exam  GENERAL:  65 y.o.-year-old patient lying in the bed with no acute distress.  EYES: Pupils equal, round, reactive to light and accommodation. No scleral icterus. Extraocular muscles intact.  HEENT: Head atraumatic, normocephalic. Oropharynx and nasopharynx clear.  NECK:  Supple, no jugular venous distention. No thyroid  enlargement, no tenderness.  LUNGS: Normal breath sounds bilaterally, no wheezing, rales, rhonchi. No use of accessory muscles of respiration.  CARDIOVASCULAR: S1, S2 normal. No murmurs, rubs, or gallops.  ABDOMEN: Soft, nontender, nondistended. Bowel sounds present. No organomegaly or mass.  EXTREMITIES: No cyanosis, clubbing or edema b/l.   Left LE edema+ NEUROLOGIC: Cranial nerves II through XII are intact. No focal Motor or sensory deficits b/l.   PSYCHIATRIC:  patient is alert and oriented x 3.  SKIN: No obvious rash, lesion, or ulcer.   LABORATORY PANEL:  CBC  Recent Labs Lab 11/08/15 0344  WBC 10.9*  HGB 9.6*  HCT 28.7*  PLT 183    Chemistries   Recent Labs Lab 11/04/15 0813  11/08/15 0344  NA 130*  < > 131*  K 4.2  < > 3.7  CL 96*  < > 97*  CO2 26  < > 27  GLUCOSE 134*  < > 167*  BUN 12  < > 14  CREATININE 0.74  < > 0.68  CALCIUM 8.5*  < > 8.3*  AST 15  --   --   ALT 15*  --   --   ALKPHOS 162*  --   --   BILITOT 0.4  --   --   < > = values in this interval not displayed. Cardiac Enzymes No results for input(s): TROPONINI in the last 168 hours. RADIOLOGY:  US Venous Img Lower Unilateral Left  11/07/2015  CLINICAL DATA:  Left leg swelling since yesterday. EXAM: Left LOWER EXTREMITY VENOUS DOPPLER ULTRASOUND TECHNIQUE: Gray-scale sonography with graded compression, as well as color Doppler and duplex ultrasound were performed to evaluate the lower extremity  deep venous systems from the level of the common femoral vein and including the common femoral, femoral, profunda femoral, popliteal and calf veins including the posterior tibial, peroneal and gastrocnemius veins when visible. The superficial great saphenous vein was also interrogated. Spectral Doppler was utilized to evaluate flow at rest and with distal augmentation maneuvers in the common femoral, femoral and popliteal veins. COMPARISON:  None. FINDINGS: Contralateral Common Femoral Vein: Nonocclusive thrombus.  Common Femoral Vein: Nonocclusive thrombus. Saphenofemoral Junction: Nonocclusive thrombus Profunda Femoral Vein: Occlusive thrombus. Femoral Vein: Occlusive thrombus Popliteal Vein: Occlusive thrombus Calf Veins: Occlusive thrombus Superficial Great Saphenous Vein: Nonocclusive thrombus. IMPRESSION: Study is positive for deep venous thrombosis bilaterally. Nonocclusive thrombus is identified in the left common femoral vein with occlusive thrombus extending down the left femoral, profunda, popliteal, and calf veins. Nonocclusive thrombus identified in the contralateral (right) common femoral vein. Critical Value/emergent results were called by me at the time of interpretation on 11/07/2015 at 9:07 pm to Dr. Marcelene Butte, who verbally acknowledged these results. Electronically Signed   By: Misty Stanley M.D.   On: 11/07/2015 21:08   ASSESSMENT AND PLAN:   Sean Hall is a 65 y.o. male who presents with Left leg pain for 1 day. Patient has a known history of lung cancer and is currently being treated with chemotherapy and radiation. This morning when he woke up he had left leg pain.   1. Acute extensive bilateral DVT (deep venous thrombosis) (HCC)  Left > right - anticoagulation with IV heparin -risk factor Lung cancer -spoke with Vascular sx for possible thrombolysis. Dr Trula Slade will d/w dr schnier to see if pt is a candidate for it -will change to po eliquis if no plans for thrombolysis  2.Chronic obstructive pulmonary disease (HCC) - continue home meds including home inhaler -chronic home oxygen  3.HTN - continue home meds, currently stable  4.Lung cancer ([address]) - likely the cause of his DVT formation, consult oncology as above in addition to treating DVT as above -pt getting chemo thru the cancer center  5 Chronic kidney disease - currently stable, avoid nephrotoxins  Case discussed with Care Management/Social Worker. Management plans discussed with the patient, family and they are in  agreement.  CODE STATUS: full  DVT Prophylaxis: heparin gtt TOTAL TIME TAKING CARE OF THIS PATIENT: 30 minutes.  >50% time spent on counselling and coordination of care  POSSIBLE D/C IN 1-2 DAYS, DEPENDING ON CLINICAL CONDITION.  Note: This dictation was prepared with Dragon dictation along with smaller phrase technology. Any transcriptional errors that result from this process are unintentional.  Marrah Vanevery M.D on 11/08/2015 at 10:37 AM  Between 7am to 6pm - Pager - [phone]  After 6pm go to [email or web address] - password [email or web address]  [facility]  Office  [phone]  CC: Primary care physician; No PCP Per Patient

## 2015-11-08 NOTE — Progress Notes (Signed)
ANTICOAGULATION CONSULT NOTE -follow up [place] for Heparin Indication: DVT  Allergies  Allergen Reactions  . Aleve [Naproxen Sodium] Hives, Shortness Of Breath and Swelling    Naproxin    Patient Measurements: Height: '6\' 3"'$  (190.5 cm) Weight: 163 lb (73.936 kg) IBW/kg (Calculated) : 84.5 Heparin Dosing Weight: 73.9 kg  Vital Signs: Temp: 98.1 F (36.7 C) (07/02 1459) Temp Source: Oral (07/02 1459) BP: 133/66 mmHg (07/02 1459) Pulse Rate: 90 (07/02 1459)  Labs:  Recent Labs  11/07/15 1826 11/07/15 1853 11/08/15 0344 11/08/15 1116 11/08/15 1850  HGB  --  9.8* 9.6*  --   --   HCT  --  30.4* 28.7*  --   --   PLT  --  178 183  --   --   APTT  --  34  --   --   --   LABPROT  --  16.0*  --   --   --   INR  --  1.27  --   --   --   HEPARINUNFRC  --   --  0.11* <0.10* 0.14*  CREATININE 0.72  --  0.68  --   --     Estimated Creatinine Clearance: 97.5 mL/min (by C-G formula based on Cr of 0.68).   Medical History: Past Medical History  Diagnosis Date  . Atrial fibrillation ([place])   . CHF (congestive heart failure) ([place])   . Hypertension   . Disease of lung   . COPD (chronic obstructive pulmonary disease) ([place])   . Pneumonia   . Chronic kidney disease   . Cancer ([place]) 2017    Lung  . Dysrhythmia   . Headache   . Arthritis   . History of chemotherapy   . History of radiation therapy     Medications:  Prescriptions prior to admission  Medication Sig Dispense Refill Last Dose  . albuterol (PROAIR HFA) 108 (90 Base) MCG/ACT inhaler Inhale 1-2 puffs into the lungs every 4 (four) hours as needed. 1 Inhaler 0 Taking  . benzonatate (TESSALON) 200 MG capsule Take 200 mg by mouth 3 (three) times daily as needed for cough.   Taking  . HYDROcodone-acetaminophen (NORCO/VICODIN) 5-325 MG tablet Take 1 tablet by mouth every 4 (four) hours as needed for moderate pain. 60 tablet 0 Taking  . lidocaine-prilocaine (EMLA) cream Apply to affected area once 30 g  3 Taking  . Multiple Vitamin (MULTI-VITAMINS) TABS Take 1 tablet by mouth daily.    Taking  . ondansetron (ZOFRAN) 8 MG tablet Take 1 tablet (8 mg total) by mouth 2 (two) times daily as needed for refractory nausea / vomiting. Start on day 3 after chemo. 30 tablet 1 Taking  . prochlorperazine (COMPAZINE) 10 MG tablet Take 1 tablet (10 mg total) by mouth every 6 (six) hours as needed (Nausea or vomiting). 30 tablet 1 Taking   Scheduled:  . antiseptic oral rinse  7 mL Mouth Rinse BID   Infusions:  . heparin 1,650 Units/hr (11/08/15 1248)    Assessment: 65 y/o M admitted with bilateral DVT. No anticoagulants PTA per history and per RN.   Goal of Therapy:  Heparin level 0.3-0.7 units/ml Monitor platelets by anticoagulation protocol: Yes   Plan:  Give 4400 units bolus x 1 Start heparin infusion at 1250 units/hr Check anti-Xa level in 6 hours and daily while on heparin Continue to monitor H&H and platelets   7/2 AM: HL of 0.11 is subtherapeutic. Per RN no issues or  interruptions in infusion. Will give bolus of 2250 units and increase rate to 1400 units/hr. Heparin level ordered in 6 hours.  7/2 Heparin level at 1116= <0.10. Confirmed with RN that Heparin drip is running at 1400 units/hr. Will give bolus of 2200 units and increase drip to 1650 units/hr. REcheck of Heparin level in 6 hrs at 1830.  7/2 HL at 1850= 0.14. Will bolus 2200 units and increase rate to 1900 units/hr. Will recheck HL in 6 hours Confirmed with RN that drip is running at 1650 and has not been stopped today   Ramond Dial, PharmD Clinical Pharmacist 11/08/2015,7:20 PM

## 2015-11-08 NOTE — Progress Notes (Signed)
ANTICOAGULATION CONSULT NOTE - Initial Consult  Pharmacy Consult for Heparin Indication: DVT  Allergies  Allergen Reactions  . Aleve [Naproxen Sodium] Hives, Shortness Of Breath and Swelling    Naproxin    Patient Measurements: Height: '6\' 3"'$  (190.5 cm) Weight: 163 lb (73.936 kg) IBW/kg (Calculated) : 84.5 Heparin Dosing Weight: 73.9 kg  Vital Signs: Temp: 98.3 F (36.8 C) (07/01 2326) Temp Source: Oral (07/01 2326) BP: 173/72 mmHg (07/01 2326) Pulse Rate: 111 (07/01 2326)  Labs:  Recent Labs  11/07/15 1826 11/07/15 1853 11/08/15 0344  HGB  --  9.8* 9.6*  HCT  --  30.4* 28.7*  PLT  --  178 183  APTT  --  34  --   LABPROT  --  16.0*  --   INR  --  1.27  --   HEPARINUNFRC  --   --  0.11*  CREATININE 0.72  --  0.68    Estimated Creatinine Clearance: 97.5 mL/min (by C-G formula based on Cr of 0.68).   Medical History: Past Medical History  Diagnosis Date  . Atrial fibrillation ([place])   . CHF (congestive heart failure) ([place])   . Hypertension   . Disease of lung   . COPD (chronic obstructive pulmonary disease) ([place])   . Pneumonia   . Chronic kidney disease   . Cancer ([place]) 2017    Lung  . Dysrhythmia   . Headache   . Arthritis   . History of chemotherapy   . History of radiation therapy     Medications:  Prescriptions prior to admission  Medication Sig Dispense Refill Last Dose  . albuterol (PROAIR HFA) 108 (90 Base) MCG/ACT inhaler Inhale 1-2 puffs into the lungs every 4 (four) hours as needed. 1 Inhaler 0 Taking  . benzonatate (TESSALON) 200 MG capsule Take 200 mg by mouth 3 (three) times daily as needed for cough.   Taking  . HYDROcodone-acetaminophen (NORCO/VICODIN) 5-325 MG tablet Take 1 tablet by mouth every 4 (four) hours as needed for moderate pain. 60 tablet 0 Taking  . lidocaine-prilocaine (EMLA) cream Apply to affected area once 30 g 3 Taking  . Multiple Vitamin (MULTI-VITAMINS) TABS Take 1 tablet by mouth daily.    Taking  . ondansetron  (ZOFRAN) 8 MG tablet Take 1 tablet (8 mg total) by mouth 2 (two) times daily as needed for refractory nausea / vomiting. Start on day 3 after chemo. 30 tablet 1 Taking  . prochlorperazine (COMPAZINE) 10 MG tablet Take 1 tablet (10 mg total) by mouth every 6 (six) hours as needed (Nausea or vomiting). 30 tablet 1 Taking   Scheduled:  . heparin  2,250 Units Intravenous Once   Infusions:  . heparin      Assessment: 65 y/o M admitted with bilateral DVT. No anticoagulants PTA per history and per RN.   Goal of Therapy:  Heparin level 0.3-0.7 units/ml Monitor platelets by anticoagulation protocol: Yes   Plan:  Give 4400 units bolus x 1 Start heparin infusion at 1250 units/hr Check anti-Xa level in 6 hours and daily while on heparin Continue to monitor H&H and platelets   7/2 AM: HL of 0.11 is subtherapeutic. Per RN no issues or interruptions in infusion. Will give bolus of 2250 units and increase rate to 1400 units/hr. Heparin level ordered in 6 hours.  Lenis Noon, PharmD Clinical Pharmacist 11/08/2015,4:58 AM

## 2015-11-08 NOTE — Progress Notes (Signed)
ANTICOAGULATION CONSULT NOTE -follow up [facility] for Heparin Indication: DVT  Allergies  Allergen Reactions  . Aleve [Naproxen Sodium] Hives, Shortness Of Breath and Swelling    Naproxin    Patient Measurements: Height: '6\' 3"'$  (190.5 cm) Weight: 163 lb (73.936 kg) IBW/kg (Calculated) : 84.5 Heparin Dosing Weight: 73.9 kg  Vital Signs: Temp: 97.8 F (36.6 C) (07/02 0519) Temp Source: Oral (07/02 0519) BP: 144/67 mmHg (07/02 0519) Pulse Rate: 100 (07/02 0519)  Labs:  Recent Labs  11/07/15 1826 11/07/15 1853 11/08/15 0344 11/08/15 1116  HGB  --  9.8* 9.6*  --   HCT  --  30.4* 28.7*  --   PLT  --  178 183  --   APTT  --  34  --   --   LABPROT  --  16.0*  --   --   INR  --  1.27  --   --   HEPARINUNFRC  --   --  0.11* <0.10*  CREATININE 0.72  --  0.68  --     Estimated Creatinine Clearance: 97.5 mL/min (by C-G formula based on Cr of 0.68).   Medical History: Past Medical History  Diagnosis Date  . Atrial fibrillation ([place])   . CHF (congestive heart failure) ([place])   . Hypertension   . Disease of lung   . COPD (chronic obstructive pulmonary disease) ([place])   . Pneumonia   . Chronic kidney disease   . Cancer ([place]) 2017    Lung  . Dysrhythmia   . Headache   . Arthritis   . History of chemotherapy   . History of radiation therapy     Medications:  Prescriptions prior to admission  Medication Sig Dispense Refill Last Dose  . albuterol (PROAIR HFA) 108 (90 Base) MCG/ACT inhaler Inhale 1-2 puffs into the lungs every 4 (four) hours as needed. 1 Inhaler 0 Taking  . benzonatate (TESSALON) 200 MG capsule Take 200 mg by mouth 3 (three) times daily as needed for cough.   Taking  . HYDROcodone-acetaminophen (NORCO/VICODIN) 5-325 MG tablet Take 1 tablet by mouth every 4 (four) hours as needed for moderate pain. 60 tablet 0 Taking  . lidocaine-prilocaine (EMLA) cream Apply to affected area once 30 g 3 Taking  . Multiple Vitamin (MULTI-VITAMINS) TABS  Take 1 tablet by mouth daily.    Taking  . ondansetron (ZOFRAN) 8 MG tablet Take 1 tablet (8 mg total) by mouth 2 (two) times daily as needed for refractory nausea / vomiting. Start on day 3 after chemo. 30 tablet 1 Taking  . prochlorperazine (COMPAZINE) 10 MG tablet Take 1 tablet (10 mg total) by mouth every 6 (six) hours as needed (Nausea or vomiting). 30 tablet 1 Taking   Scheduled:  . antiseptic oral rinse  7 mL Mouth Rinse BID  . heparin  2,200 Units Intravenous Once   Infusions:  . heparin 1,400 Units/hr (11/08/15 1033)    Assessment: 65 y/o M admitted with bilateral DVT. No anticoagulants PTA per history and per RN.   Goal of Therapy:  Heparin level 0.3-0.7 units/ml Monitor platelets by anticoagulation protocol: Yes   Plan:  Give 4400 units bolus x 1 Start heparin infusion at 1250 units/hr Check anti-Xa level in 6 hours and daily while on heparin Continue to monitor H&H and platelets   7/2 AM: HL of 0.11 is subtherapeutic. Per RN no issues or interruptions in infusion. Will give bolus of 2250 units and increase rate to 1400 units/hr. Heparin  level ordered in 6 hours.  7/2 Heparin level at 1116= <0.10. Confirmed with RN that Heparin drip is running at 1400 units/hr. Will give bolus of 2200 units and increase drip to 1650 units/hr. REcheck of Heparin level in 6 hrs at 1830.  Noralee Space, PharmD Clinical Pharmacist 11/08/2015,12:28 PM

## 2015-11-08 NOTE — Progress Notes (Signed)
Pt slept soundly last night without any complications.  Heparin drip increased to 32m /hr and 2250 unit bolus given with nurse KClaiborne Billingsas second witness. SDorna BloomRN

## 2015-11-08 NOTE — Progress Notes (Signed)
[place]  [phone] [phone]) [phone]  ID: Sean Hall OB: 09/12/1950  MR#: [id number]  AOZ#:[id number]  Patient Care Team: No Pcp Per Patient as PCP - General ([place]) Nestor Lewandowsky, MD as Consulting Physician (Cardiothoracic Surgery)  CHIEF COMPLAINT:  Chief Complaint  Patient presents with  . Lung Cancer    INTERVAL HISTORY: Patient returns to clinic today for further evaluation and consideration of cycle 3 of weekly carboplatinum and Taxol concurrently with daily XRT. He feels much worse today. He has increasing weakness and fatigue, "body aches", and shortness of breath. His oxygen saturations less than 90% on room air. Increased to approximately 90%.  He has no neurologic complaints. He denies any fevers. He denies any chest pain or hemoptysis. He has a chronic cough that is unchanged.  He denies any weight loss. He denies any nausea, vomiting, consultation, or diarrhea. He has no urinary complaints. Patient feels generally terrible, but offers no further specific complaints.  REVIEW OF SYSTEMS:   Review of Systems  Constitutional: Positive for malaise/fatigue. Negative for fever and weight loss.  Respiratory: Positive for cough and shortness of breath. Negative for hemoptysis.   Cardiovascular: Negative.  Negative for chest pain.  Gastrointestinal: Negative.  Negative for abdominal pain.  Genitourinary: Negative.   Musculoskeletal: Positive for back pain.  Neurological: Positive for weakness.  Psychiatric/Behavioral: The patient is nervous/anxious. The patient does not have insomnia.     As per HPI. Otherwise, a complete review of systems is negatve.  PAST MEDICAL HISTORY: Past Medical History  Diagnosis Date  . Atrial fibrillation ([place])   . CHF (congestive heart failure) ([place])   . Hypertension   . Disease of lung   . COPD (chronic obstructive pulmonary disease) ([place])   . Pneumonia   . Chronic kidney disease   . Cancer  ([place]) 2017    Lung  . Dysrhythmia   . Headache   . Arthritis   . History of chemotherapy   . History of radiation therapy     PAST SURGICAL HISTORY: Past Surgical History  Procedure Laterality Date  . Knee surgery Bilateral   . Tonsillectomy    . Portacath placement N/A 10/29/2015    Procedure: INSERTION PORT-A-CATH;  Surgeon: Nestor Lewandowsky, MD;  Location: [facility] ORS;  Service: General;  Laterality: N/A;    FAMILY HISTORY Family History  Problem Relation Age of Onset  . Diabetes Father   . Hypertension Father        ADVANCED DIRECTIVES:    HEALTH MAINTENANCE: Social History  Substance Use Topics  . Smoking status: Former Smoker -- 2.00 packs/day for 50 years    Types: Cigarettes    Quit date: 08/07/2013  . Smokeless tobacco: Never Used  . Alcohol Use: 0.0 oz/week    0 Standard drinks or equivalent per week     Comment: rarely     Colonoscopy:  PAP:  Bone density:  Lipid panel:  Allergies  Allergen Reactions  . Aleve [Naproxen Sodium] Hives, Shortness Of Breath and Swelling    Naproxin    No current facility-administered medications for this visit.   No current outpatient prescriptions on file.   Facility-Administered Medications Ordered in Other Visits  Medication Dose Route Frequency Provider Last Rate Last Dose  . acetaminophen (TYLENOL) tablet 650 mg  650 mg Oral Q6H PRN Lance Coon, MD       Or  . acetaminophen (TYLENOL) suppository 650 mg  650 mg Rectal Q6H PRN Lance Coon, MD      .  albuterol (PROVENTIL) (2.5 MG/3ML) 0.083% nebulizer solution 2.5 mg  2.5 mg Nebulization Q4H PRN Lenis Noon, RPH   2.5 mg at 11/07/15 2225  . antiseptic oral rinse (CPC / CETYLPYRIDINIUM CHLORIDE 0.05%) solution 7 mL  7 mL Mouth Rinse BID Fritzi Mandes, MD   7 mL at 11/08/15 1210  . benzonatate (TESSALON) capsule 200 mg  200 mg Oral TID PRN Lance Coon, MD      . heparin ADULT infusion 100 units/mL (25000 units/255m sodium chloride 0.45%)  1,650 Units/hr Intravenous  Continuous GNance Pear MD 16.5 mL/hr at 11/08/15 1248 1,650 Units/hr at 11/08/15 1248  . HYDROcodone-acetaminophen (NORCO/VICODIN) 5-325 MG per tablet 1 tablet  1 tablet Oral Q4H PRN DLance Coon MD   1 tablet at 11/08/15 0950  . morphine 2 MG/ML injection 2 mg  2 mg Intravenous Q4H PRN DLance Coon MD   2 mg at 11/08/15 1336  . ondansetron (ZOFRAN) tablet 8 mg  8 mg Oral BID PRN DLance Coon MD      . prochlorperazine (COMPAZINE) tablet 10 mg  10 mg Oral Q6H PRN DLance Coon MD        OBJECTIVE: Filed Vitals:   11/04/15 0839  BP: 161/76  Pulse: 111  Temp: 96.9 F (36.1 C)  Resp: 18     Body mass index is 20.38 kg/(m^2).    ECOG FS:1 - Symptomatic but completely ambulatory  General: Well-developed, well-nourished,  mild respiratory  distress. Eyes: Pink conjunctiva, anicteric sclera. Lungs: Scattered wheezing throughout. Heart: Regular rate and rhythm. No rubs, murmurs, or gallops. Abdomen: Soft, nontender, nondistended. No organomegaly noted, normoactive bowel sounds. Musculoskeletal: No edema, cyanosis, or clubbing. Neuro: Alert, answering all questions appropriately. Cranial nerves grossly intact. Skin: No rashes or petechiae noted. Psych: Normal affect.   LAB RESULTS:  Lab Results  Component Value Date   NA 131* 11/08/2015   K 3.7 11/08/2015   CL 97* 11/08/2015   CO2 27 11/08/2015   GLUCOSE 167* 11/08/2015   BUN 14 11/08/2015   CREATININE 0.68 11/08/2015   CALCIUM 8.3* 11/08/2015   PROT 7.4 11/04/2015   ALBUMIN 2.7* 11/04/2015   AST 15 11/04/2015   ALT 15* 11/04/2015   ALKPHOS 162* 11/04/2015   BILITOT 0.4 11/04/2015   GFRNONAA >60 11/08/2015   GFRAA >60 11/08/2015    Lab Results  Component Value Date   WBC 10.9* 11/08/2015   NEUTROABS 7.2* 11/04/2015   HGB 9.6* 11/08/2015   HCT 28.7* 11/08/2015   MCV 72.1* 11/08/2015   PLT 183 11/08/2015     STUDIES: X-ray Chest Pa Or Ap  10/29/2015  CLINICAL DATA:  Port-A-Cath placement.  Lung mass. EXAM:  CHEST 1 VIEW COMPARISON:  CT chest 09/22/2015.  Chest x-ray 09/07/2015 FINDINGS: Left subclavian Port-A-Cath tip in the SVC.  No pneumothorax. Extensive right upper lobe mass has progressed in the interval. There may be adjacent infiltrate or collapse. Lower lobes not included on the study. Underlying COPD. IMPRESSION: Satisfactory Port-A-Cath placement. Progression of right upper lobe mass and atelectasis/infiltrate. Electronically Signed   By: CFranchot GalloM.D.   On: 10/29/2015 09:37   Dg Chest 2 View  11/04/2015  CLINICAL DATA:  Malignant neoplasm RIGHT upper lobe, increasing shortness of breath for 3 days, CHF, atrial fibrillation, COPD, hypertension EXAM: CHEST  2 VIEW COMPARISON:  10/29/2015 FINDINGS: LEFT subclavian Port-A-Cath with tip projecting over SVC. Stable heart size and pulmonary vascularity. Atherosclerotic calcification aorta. Chronic opacity RIGHT upper lobe corresponding to known neoplasm. RIGHT hilar enlargement due  to mass/adenopathy again seen. Underlying COPD changes. Scarring in LEFT upper lobe. No definite acute infiltrate, pleural effusion or pneumothorax. IMPRESSION: RIGHT upper lobe malignancy with RIGHT hilar adenopathy/mass. COPD changes with LEFT upper lobe scarring. No acute abnormalities. Electronically Signed   By: Lavonia Dana M.D.   On: 11/04/2015 10:56   Mr Jeri Cos HY Contrast  10/19/2015  CLINICAL DATA:  Recently diagnosed non-small cell lung cancer involving the right upper lobe. Staging. Occasional headaches. EXAM: MRI HEAD WITHOUT AND WITH CONTRAST TECHNIQUE: Multiplanar, multiecho pulse sequences of the brain and surrounding structures were obtained without and with intravenous contrast. CONTRAST:  65m MULTIHANCE GADOBENATE DIMEGLUMINE 529 MG/ML IV SOLN COMPARISON:  None. FINDINGS: There is no evidence of acute infarct, intracranial hemorrhage, mass, midline shift, or extra-axial fluid collection. There are small, chronic cortical and subcortical infarcts in the  left frontal lobe. Small foci of T2 hyperintensity elsewhere involving the white matter of the left greater than right cerebral hemispheres are nonspecific but compatible with chronic small vessel ischemic disease, mildly advanced for age. There is slight ex vacuo enlargement of the frontal horn of the left lateral ventricle. No abnormal enhancement is identified. Orbits are unremarkable. Paranasal sinuses and mastoid air cells are clear. Major intracranial vascular flow voids are preserved. No suspicious skull lesion is identified. IMPRESSION: 1. No evidence of intracranial metastatic disease. 2. Chronic ischemic changes including small chronic left frontal infarcts. Electronically Signed   By: ALogan BoresM.D.   On: 10/19/2015 09:42   Nm Pet Image Initial (pi) Skull Base To Thigh  10/15/2015  CLINICAL DATA:  Initial treatment strategy for non-small cell lung cancer. EXAM: NUCLEAR MEDICINE PET SKULL BASE TO THIGH TECHNIQUE: 12.07 mCi F-18 FDG was injected intravenously. Full-ring PET imaging was performed from the skull base to thigh after the radiotracer. CT data was obtained and used for attenuation correction and anatomic localization. FASTING BLOOD GLUCOSE:  Value: 124 mg/dl COMPARISON:  Chest CT 09/22/2015 FINDINGS: NECK No hypermetabolic lymph nodes in the neck. CHEST Large right upper lobe lung mass is markedly hypermetabolic with SUV max of 19. There is right hilar and mediastinal lymphadenopathy which is also hypermetabolic. A right epicardial lymph image number 107 measures 21 mm and has SUV max of 4.9. Sub carinal lymph node on image 1 to measures 13 mm and SUV max is 2.85. No contralateral mediastinal or hilar adenopathy. Small subpleural nodular density on image 125 is not hypermetabolic and likely subpleural atelectasis. No definite metastatic pulmonary nodules in the left lung. Stable underlying emphysematous changes and pulmonary scarring. Small right-sided pleural effusion but no hypermetabolism  or pleural nodularity. Stable three-vessel coronary artery calcifications. ABDOMEN/PELVIS No abnormal hypermetabolic activity within the liver, pancreas, adrenal glands, or spleen. No hypermetabolic lymph nodes in the abdomen or pelvis. The low-attenuation liver lesions seen on the prior CT scan is not hypermetabolic and somewhat photopenic. This is likely a benign cyst. No adrenal gland metastasis. Incidental findings include age advanced aortic and branch vessel atherosclerotic calcifications and bilateral inguinal hernias containing bowel. SKELETON No focal hypermetabolic activity to suggest skeletal metastasis. IMPRESSION: 1. Large right upper lobe lung mass consistent with known neoplasm. Mediastinal and hilar lymphadenopathy. 2. No findings for metastatic disease involving the lungs, neck, abdomen/pelvis or osseous structures. Electronically Signed   By: PMarijo SanesM.D.   On: 10/15/2015 10:10   UKoreaVenous Img Lower Unilateral Left  11/07/2015  CLINICAL DATA:  Left leg swelling since yesterday. EXAM: Left LOWER EXTREMITY VENOUS DOPPLER ULTRASOUND TECHNIQUE:  Gray-scale sonography with graded compression, as well as color Doppler and duplex ultrasound were performed to evaluate the lower extremity deep venous systems from the level of the common femoral vein and including the common femoral, femoral, profunda femoral, popliteal and calf veins including the posterior tibial, peroneal and gastrocnemius veins when visible. The superficial great saphenous vein was also interrogated. Spectral Doppler was utilized to evaluate flow at rest and with distal augmentation maneuvers in the common femoral, femoral and popliteal veins. COMPARISON:  None. FINDINGS: Contralateral Common Femoral Vein: Nonocclusive thrombus. Common Femoral Vein: Nonocclusive thrombus. Saphenofemoral Junction: Nonocclusive thrombus Profunda Femoral Vein: Occlusive thrombus. Femoral Vein: Occlusive thrombus Popliteal Vein: Occlusive thrombus  Calf Veins: Occlusive thrombus Superficial Great Saphenous Vein: Nonocclusive thrombus. IMPRESSION: Study is positive for deep venous thrombosis bilaterally. Nonocclusive thrombus is identified in the left common femoral vein with occlusive thrombus extending down the left femoral, profunda, popliteal, and calf veins. Nonocclusive thrombus identified in the contralateral (right) common femoral vein. Critical Value/emergent results were called by me at the time of interpretation on 11/07/2015 at 9:07 pm to Dr. Marcelene Butte, who verbally acknowledged these results. Electronically Signed   By: Misty Stanley M.D.   On: 11/07/2015 21:08   Dg C-arm 1-60 Min  10/29/2015  CLINICAL DATA:  Port-A-Cath placement. EXAM: DG C-ARM 61-120 MIN COMPARISON:  CT 09/22/2015 . FINDINGS: Port-A-Cath tip noted projected over the lower portion superior vena cava. Cine images obtained. 1 minutes 22 seconds fluoroscopy time. IMPRESSION: Port-A-Cath tip noted projected over the lower portion of the superior vena cava. 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  PLAN:    1. Stage III B poorly differentiated carcinoma of the right upper lobe lung: CT and PET results reviewed independently. MRI of brain negative for metastatic disease.  Pathology consistent with poorly differentiated carcinoma, but there is not enough tissue for additional molecular testing. Delay cycle 2 of weekly carboplatinum and Taxol secondary to increasing shortness of breath. Continue daily XRT.  At the conclusion of his XRT, patient will likely benefit from 2 consolidation doses of chemotherapy. Return to clinic in 1 week for reconsideration of cycle 3. 2. Venous access:  Patient had port placed last week. 3. Leukocytosis: Likely reactive, monitor. 4. Thrombocytosis: Resolved.  5. Back pain:  Continue Norco as needed. 6. Shortness of breath:  Chest x-ray as above with no obvious infiltrate. Continue inhalers as prescribed. Patient was also given an order for home oxygen. He will likely need a pulmonary consult in the near future.  Patient expressed understanding and was in agreement with this plan. He also understands that He can call clinic at any time with any questions, concerns, or complaints.   Lung cancer [facility])   Staging form: Lung, AJCC 7th Edition     Clinical stage from 10/17/2015: Stage IIIB (T4, N2, M0) - Signed by Lloyd Huger, MD on 10/17/2015   Lloyd Huger, MD   11/08/2015 2:13 PM

## 2015-11-08 NOTE — Consult Note (Signed)
[facility] CONSULT NOTE  Patient Care Team: No Pcp Per Patient as PCP - General (General Practice) Nestor Lewandowsky, MD as Consulting Physician (Cardiothoracic Surgery)  CHIEF COMPLAINTS/PURPOSE OF CONSULTATION: Left lower extremity DVT/lung cancer  HISTORY OF PRESENTING ILLNESS:  Sean Hall 65 y.o.  male patient who follows up with Dr. Grayland Ormond- for his newly diagnosed stage III non-small cell lung cancer currently on chemoradiation [weekly carbotaxol] status post 2 cycles; currently admitted to the hospital for worsening left lower extremity pain/swelling.  Patient did not get his weekly cycle #3 appx 3 days ago because of shortness of breath/cough. He noted to have worsening swelling in his left lower extremity over the last 1-2 days significant pain difficulty in ambulation. Patient was directed to the emergency room- when ultrasound of the leg showed the significant DVT of the left lower extremity; also DVT of the right lower extremity. Patient has been admitted for IV heparin/given the extensive DVT.   Patient states his pain in his left lower extremity is improved;  However it continues to hurt with movement.  Patient has chronic shortness of breath not be significantly worse. Denies any hemoptysis or worsening cough.  Appetite fair.  Denies any headaches vision changes.    ROS: A complete 10 point review of system is done which is negative except mentioned above in history of present illness  MEDICAL HISTORY:  Past Medical History  Diagnosis Date  . Atrial fibrillation ([place])   . CHF (congestive heart failure) ([place])   . Hypertension   . Disease of lung   . COPD (chronic obstructive pulmonary disease) ([place])   . Pneumonia   . Chronic kidney disease   . Cancer ([place]) 2017    Lung  . Dysrhythmia   . Headache   . Arthritis   . History of chemotherapy   . History of radiation therapy     SURGICAL HISTORY: Past Surgical History  Procedure Laterality Date  .  Knee surgery Bilateral   . Tonsillectomy    . Portacath placement N/A 10/29/2015    Procedure: INSERTION PORT-A-CATH;  Surgeon: Nestor Lewandowsky, MD;  Location: [facility] ORS;  Service: General;  Laterality: N/A;    SOCIAL HISTORY: Social History   Social History  . Marital Status: Single    Spouse Name: N/A  . Number of Children: N/A  . Years of Education: N/A   Occupational History  . Not on file.   Social History Main Topics  . Smoking status: Former Smoker -- 2.00 packs/day for 50 years    Types: Cigarettes    Quit date: 08/07/2013  . Smokeless tobacco: Never Used  . Alcohol Use: 0.0 oz/week    0 Standard drinks or equivalent per week     Comment: rarely  . Drug Use: No  . Sexual Activity: Not on file   Other Topics Concern  . Not on file   Social History Narrative    FAMILY HISTORY: Family History  Problem Relation Age of Onset  . Diabetes Father   . Hypertension Father     ALLERGIES:  is allergic to aleve.  MEDICATIONS:  Current Facility-Administered Medications  Medication Dose Route Frequency Provider Last Rate Last Dose  . acetaminophen (TYLENOL) tablet 650 mg  650 mg Oral Q6H PRN Lance Coon, MD       Or  . acetaminophen (TYLENOL) suppository 650 mg  650 mg Rectal Q6H PRN Lance Coon, MD      . albuterol (PROVENTIL) (2.5 MG/3ML) 0.083% nebulizer solution  2.5 mg  2.5 mg Nebulization Q4H PRN Lenis Noon, RPH   2.5 mg at 11/07/15 2225  . antiseptic oral rinse (CPC / CETYLPYRIDINIUM CHLORIDE 0.05%) solution 7 mL  7 mL Mouth Rinse BID Fritzi Mandes, MD   7 mL at 11/08/15 1210  . benzonatate (TESSALON) capsule 200 mg  200 mg Oral TID PRN Lance Coon, MD      . heparin ADULT infusion 100 units/mL (25000 units/276m sodium chloride 0.45%)  1,650 Units/hr Intravenous Continuous GNance Pear MD 14 mL/hr at 11/08/15 1033 1,400 Units/hr at 11/08/15 1033  . heparin bolus via infusion 2,200 Units  2,200 Units Intravenous Once GNance Pear MD      .  HYDROcodone-acetaminophen (NORCO/VICODIN) 5-325 MG per tablet 1 tablet  1 tablet Oral Q4H PRN DLance Coon MD   1 tablet at 11/08/15 0950  . morphine 2 MG/ML injection 2 mg  2 mg Intravenous Q4H PRN DLance Coon MD   2 mg at 11/08/15 0736  . ondansetron (ZOFRAN) tablet 8 mg  8 mg Oral BID PRN DLance Coon MD      . prochlorperazine (COMPAZINE) tablet 10 mg  10 mg Oral Q6H PRN DLance Coon MD          .  PHYSICAL EXAMINATION:  Filed Vitals:   11/07/15 2326 11/08/15 0519  BP: 173/72 144/67  Pulse: 111 100  Temp: 98.3 F (36.8 C) 97.8 F (36.6 C)  Resp: 20 18   Filed Weights   11/07/15 1718 11/07/15 2328 11/07/15 2331  Weight: 163 lb (73.936 kg) 205 lb 9 oz (93.243 kg) 163 lb (73.936 kg)    GENERAL: Well-nourished well-developed; Alert, no distress and comfortable.  No acute distress. EYES: no pallor or icterus OROPHARYNX: no thrush or ulceration. Dentures. NECK: supple, no masses felt LYMPH:  no palpable lymphadenopathy in the cervical, axillary or inguinal regions LUNGS: decreased breath sounds to auscultation at bases and positive for bilateral coarse breath sounds  HEART/CVS: regular rate & rhythm and no murmurs; left lower extremity swollen compared to the right. Tenderness left calf/thigh pulses intact no significant skin changes. ABDOMEN: abdomen soft, non-tender and normal bowel sounds Musculoskeletal:no cyanosis of digits and no clubbing  PSYCH: alert & oriented x 3 with fluent speech NEURO: no focal motor/sensory deficits SKIN:  no rashes or significant lesions  LABORATORY DATA:  I have reviewed the data as listed Lab Results  Component Value Date   WBC 10.9* 11/08/2015   HGB 9.6* 11/08/2015   HCT 28.7* 11/08/2015   MCV 72.1* 11/08/2015   PLT 183 11/08/2015    Recent Labs  10/21/15 0834 10/27/15 1250 11/04/15 0813 11/07/15 1826 11/08/15 0344  NA 131* 132* 130* 130* 131*  K 4.0 4.4 4.2 4.1 3.7  CL 96* 96* 96* 94* 97*  CO2 '28 27 26 25 27  '$ GLUCOSE  133* 114* 134* 170* 167*  BUN '10 17 12 14 14  '$ CREATININE 0.78 0.80 0.74 0.72 0.68  CALCIUM 8.9 9.0 8.5* 8.7* 8.3*  GFRNONAA >60 >60 >60 >60 >60  GFRAA >60 >60 >60 >60 >60  PROT 8.7* 8.5* 7.4  --   --   ALBUMIN 2.7* 2.7* 2.7*  --   --   AST 14* 16 15  --   --   ALT 14* 18 15*  --   --   ALKPHOS 147* 152* 162*  --   --   BILITOT 0.4 0.1* 0.4  --   --     RADIOGRAPHIC STUDIES: I have  personally reviewed the radiological images as listed and agreed with the findings in the report. X-ray Chest Pa Or Ap  10/29/2015  CLINICAL DATA:  Port-A-Cath placement.  Lung mass. EXAM: CHEST 1 VIEW COMPARISON:  CT chest 09/22/2015.  Chest x-ray 09/07/2015 FINDINGS: Left subclavian Port-A-Cath tip in the SVC.  No pneumothorax. Extensive right upper lobe mass has progressed in the interval. There may be adjacent infiltrate or collapse. Lower lobes not included on the study. Underlying COPD. IMPRESSION: Satisfactory Port-A-Cath placement. Progression of right upper lobe mass and atelectasis/infiltrate. Electronically Signed   By: Franchot Gallo M.D.   On: 10/29/2015 09:37   Dg Chest 2 View  11/04/2015  CLINICAL DATA:  Malignant neoplasm RIGHT upper lobe, increasing shortness of breath for 3 days, CHF, atrial fibrillation, COPD, hypertension EXAM: CHEST  2 VIEW COMPARISON:  10/29/2015 FINDINGS: LEFT subclavian Port-A-Cath with tip projecting over SVC. Stable heart size and pulmonary vascularity. Atherosclerotic calcification aorta. Chronic opacity RIGHT upper lobe corresponding to known neoplasm. RIGHT hilar enlargement due to mass/adenopathy again seen. Underlying COPD changes. Scarring in LEFT upper lobe. No definite acute infiltrate, pleural effusion or pneumothorax. IMPRESSION: RIGHT upper lobe malignancy with RIGHT hilar adenopathy/mass. COPD changes with LEFT upper lobe scarring. No acute abnormalities. Electronically Signed   By: Lavonia Dana M.D.   On: 11/04/2015 10:56   Mr Jeri Cos PZ Contrast  10/19/2015   CLINICAL DATA:  Recently diagnosed non-small cell lung cancer involving the right upper lobe. Staging. Occasional headaches. EXAM: MRI HEAD WITHOUT AND WITH CONTRAST TECHNIQUE: Multiplanar, multiecho pulse sequences of the brain and surrounding structures were obtained without and with intravenous contrast. CONTRAST:  88m MULTIHANCE GADOBENATE DIMEGLUMINE 529 MG/ML IV SOLN COMPARISON:  None. FINDINGS: There is no evidence of acute infarct, intracranial hemorrhage, mass, midline shift, or extra-axial fluid collection. There are small, chronic cortical and subcortical infarcts in the left frontal lobe. Small foci of T2 hyperintensity elsewhere involving the white matter of the left greater than right cerebral hemispheres are nonspecific but compatible with chronic small vessel ischemic disease, mildly advanced for age. There is slight ex vacuo enlargement of the frontal horn of the left lateral ventricle. No abnormal enhancement is identified. Orbits are unremarkable. Paranasal sinuses and mastoid air cells are clear. Major intracranial vascular flow voids are preserved. No suspicious skull lesion is identified. IMPRESSION: 1. No evidence of intracranial metastatic disease. 2. Chronic ischemic changes including small chronic left frontal infarcts. Electronically Signed   By: ALogan BoresM.D.   On: 10/19/2015 09:42   Nm Pet Image Initial (pi) Skull Base To Thigh  10/15/2015  CLINICAL DATA:  Initial treatment strategy for non-small cell lung cancer. EXAM: NUCLEAR MEDICINE PET SKULL BASE TO THIGH TECHNIQUE: 12.07 mCi F-18 FDG was injected intravenously. Full-ring PET imaging was performed from the skull base to thigh after the radiotracer. CT data was obtained and used for attenuation correction and anatomic localization. FASTING BLOOD GLUCOSE:  Value: 124 mg/dl COMPARISON:  Chest CT 09/22/2015 FINDINGS: NECK No hypermetabolic lymph nodes in the neck. CHEST Large right upper lobe lung mass is markedly hypermetabolic  with SUV max of 19. There is right hilar and mediastinal lymphadenopathy which is also hypermetabolic. A right epicardial lymph image number 107 measures 21 mm and has SUV max of 4.9. Sub carinal lymph node on image 1 to measures 13 mm and SUV max is 2.85. No contralateral mediastinal or hilar adenopathy. Small subpleural nodular density on image 125 is not hypermetabolic and likely subpleural atelectasis. No  definite metastatic pulmonary nodules in the left lung. Stable underlying emphysematous changes and pulmonary scarring. Small right-sided pleural effusion but no hypermetabolism or pleural nodularity. Stable three-vessel coronary artery calcifications. ABDOMEN/PELVIS No abnormal hypermetabolic activity within the liver, pancreas, adrenal glands, or spleen. No hypermetabolic lymph nodes in the abdomen or pelvis. The low-attenuation liver lesions seen on the prior CT scan is not hypermetabolic and somewhat photopenic. This is likely a benign cyst. No adrenal gland metastasis. Incidental findings include age advanced aortic and branch vessel atherosclerotic calcifications and bilateral inguinal hernias containing bowel. SKELETON No focal hypermetabolic activity to suggest skeletal metastasis. IMPRESSION: 1. Large right upper lobe lung mass consistent with known neoplasm. Mediastinal and hilar lymphadenopathy. 2. No findings for metastatic disease involving the lungs, neck, abdomen/pelvis or osseous structures. Electronically Signed   By: Marijo Sanes M.D.   On: 10/15/2015 10:10   US Venous Img Lower Unilateral Left  11/07/2015  CLINICAL DATA:  Left leg swelling since yesterday. EXAM: Left LOWER EXTREMITY VENOUS DOPPLER ULTRASOUND TECHNIQUE: Gray-scale sonography with graded compression, as well as color Doppler and duplex ultrasound were performed to evaluate the lower extremity deep venous systems from the level of the common femoral vein and including the common femoral, femoral, profunda femoral, popliteal  and calf veins including the posterior tibial, peroneal and gastrocnemius veins when visible. The superficial great saphenous vein was also interrogated. Spectral Doppler was utilized to evaluate flow at rest and with distal augmentation maneuvers in the common femoral, femoral and popliteal veins. COMPARISON:  None. FINDINGS: Contralateral Common Femoral Vein: Nonocclusive thrombus. Common Femoral Vein: Nonocclusive thrombus. Saphenofemoral Junction: Nonocclusive thrombus Profunda Femoral Vein: Occlusive thrombus. Femoral Vein: Occlusive thrombus Popliteal Vein: Occlusive thrombus Calf Veins: Occlusive thrombus Superficial Great Saphenous Vein: Nonocclusive thrombus. IMPRESSION: Study is positive for deep venous thrombosis bilaterally. Nonocclusive thrombus is identified in the left common femoral vein with occlusive thrombus extending down the left femoral, profunda, popliteal, and calf veins. Nonocclusive thrombus identified in the contralateral (right) common femoral vein. Critical Value/emergent results were called by me at the time of interpretation on 11/07/2015 at 9:07 pm to Dr. Marcelene Butte, who verbally acknowledged these results. Electronically Signed   By: Misty Stanley M.D.   On: 11/07/2015 21:08   Dg C-arm 1-60 Min  10/29/2015  CLINICAL DATA:  Port-A-Cath placement. EXAM: DG C-ARM 61-120 MIN COMPARISON:  CT 09/22/2015 . FINDINGS: Port-A-Cath tip noted projected over the lower portion superior vena cava. Cine images obtained. 1 minutes 22 seconds fluoroscopy time. IMPRESSION: Port-A-Cath tip noted projected over the lower portion of the superior vena cava. Electronically Signed   By: Marcello Moores  Register   On: 10/29/2015 08:48    ASSESSMENT & PLAN:   # 65 year old male patient with recently diagnosed Stage III non-small cell lung cancer currently on concurrent chemoradiation-admitted to the hospital for left lower extremity swelling/pain  # Left lower extremity swelling/pain- ultrasound Doppler showed  bilateral lower extremity DVT left more than right. Patient is currently on IV heparin. Symptomatically improved. Vascular surgery has been consulted; await recommendations. If no interventions planned recommend switching to oral anticoagulants like- Eliquis or xarelto.   # Non-small cell lung cancer stage III on chemoradiation status post 2 weekly doses of carbo-taxol.   # COPD-stable.   All questions were answered. The patient knows to call the clinic with any problems, questions or concerns.  Thank you Dr. Posey Pronto for allowing me to participate in the care of your pleasant patient. Please do not hesitate to contact me with questions  or concerns in the interim.    Cammie Sickle, MD 11/08/2015 12:33 PM

## 2015-11-09 ENCOUNTER — Other Ambulatory Visit: Payer: Self-pay | Admitting: Vascular Surgery

## 2015-11-09 ENCOUNTER — Ambulatory Visit: Payer: Medicaid Other

## 2015-11-09 LAB — CBC
HCT: 27.7 % — ABNORMAL LOW (ref 40.0–52.0)
Hemoglobin: 9 g/dL — ABNORMAL LOW (ref 13.0–18.0)
MCH: 23.4 pg — AB (ref 26.0–34.0)
MCHC: 32.4 g/dL (ref 32.0–36.0)
MCV: 72.3 fL — AB (ref 80.0–100.0)
PLATELETS: 219 10*3/uL (ref 150–440)
RBC: 3.83 MIL/uL — AB (ref 4.40–5.90)
RDW: 19.3 % — AB (ref 11.5–14.5)
WBC: 9.8 10*3/uL (ref 3.8–10.6)

## 2015-11-09 LAB — HEPARIN LEVEL (UNFRACTIONATED)
Heparin Unfractionated: 0.27 IU/mL — ABNORMAL LOW (ref 0.30–0.70)
Heparin Unfractionated: 0.34 IU/mL (ref 0.30–0.70)

## 2015-11-09 MED ORDER — IPRATROPIUM-ALBUTEROL 0.5-2.5 (3) MG/3ML IN SOLN: 3.0000 mL | Freq: Four times a day (QID) | RESPIRATORY_TRACT | Status: DC | PRN

## 2015-11-09 MED ORDER — ENOXAPARIN (LOVENOX) PATIENT EDUCATION KIT
PACK | Freq: Once | Status: AC
  Administered 2015-11-09: 15:00:00
  Filled 2015-11-09: qty 1

## 2015-11-09 MED ORDER — HEPARIN (PORCINE) IN NACL 100-0.45 UNIT/ML-% IJ SOLN
2000.0000 [IU]/h | INTRAMUSCULAR | Status: DC
  Filled 2015-11-09 (×2): qty 250

## 2015-11-09 MED ORDER — ENOXAPARIN SODIUM 150 MG/ML ~~LOC~~ SOLN: 1.0000 mg/kg | Freq: Two times a day (BID) | SUBCUTANEOUS | Status: DC

## 2015-11-09 MED ORDER — GUAIFENESIN 100 MG/5ML PO SOLN: 5.0000 mL | ORAL | Status: DC | PRN

## 2015-11-09 MED ORDER — ENOXAPARIN SODIUM 80 MG/0.8ML ~~LOC~~ SOLN
1.0000 mg/kg | Freq: Two times a day (BID) | SUBCUTANEOUS | Status: DC
  Administered 2015-11-09: 75 mg via SUBCUTANEOUS
  Filled 2015-11-09: qty 0.8

## 2015-11-09 MED ORDER — HEPARIN BOLUS VIA INFUSION
1100.0000 [IU] | Freq: Once | INTRAVENOUS | Status: AC
  Administered 2015-11-09: 1100 [IU] via INTRAVENOUS
  Filled 2015-11-09: qty 1100

## 2015-11-09 NOTE — Progress Notes (Signed)
ANTICOAGULATION CONSULT NOTE -follow up [place] for Heparin Indication: DVT  Allergies  Allergen Reactions  . Aleve [Naproxen Sodium] Hives, Shortness Of Breath and Swelling    Naproxin    Patient Measurements: Height: '6\' 3"'$  (190.5 cm) Weight: 163 lb (73.936 kg) IBW/kg (Calculated) : 84.5 Heparin Dosing Weight: 73.9 kg  Vital Signs: Temp: 98 F (36.7 C) (07/03 0513) Temp Source: Oral (07/03 0513) BP: 122/89 mmHg (07/03 0513) Pulse Rate: 92 (07/03 0513)  Labs:  Recent Labs  11/07/15 1826  11/07/15 1853 11/08/15 0344  11/08/15 1850 11/09/15 0235 11/09/15 0935  HGB  --   < > 9.8* 9.6*  --   --  9.0*  --   HCT  --   --  30.4* 28.7*  --   --  27.7*  --   PLT  --   --  178 183  --   --  219  --   APTT  --   --  34  --   --   --   --   --   LABPROT  --   --  16.0*  --   --   --   --   --   INR  --   --  1.27  --   --   --   --   --   HEPARINUNFRC  --   --   --  0.11*  < > 0.14* 0.27* 0.34  CREATININE 0.72  --   --  0.68  --   --   --   --   < > = values in this interval not displayed.  Estimated Creatinine Clearance: 97.5 mL/min (by C-G formula based on Cr of 0.68).   Assessment: 65 y/o M admitted with bilateral DVT. No anticoagulants PTA per history and per RN.   Goal of Therapy:  Heparin level 0.3-0.7 units/ml Monitor platelets by anticoagulation protocol: Yes   Plan:  Give 4400 units bolus x 1 Start heparin infusion at 1250 units/hr Check anti-Xa level in 6 hours and daily while on heparin Continue to monitor H&H and platelets   7/2 AM: HL of 0.11 is subtherapeutic. Per RN no issues or interruptions in infusion. Will give bolus of 2250 units and increase rate to 1400 units/hr. Heparin level ordered in 6 hours.  7/2 Heparin level at 1116= <0.10. Confirmed with RN that Heparin drip is running at 1400 units/hr. Will give bolus of 2200 units and increase drip to 1650 units/hr. REcheck of Heparin level in 6 hrs at 1830.  7/2 HL at 1850= 0.14.  Will bolus 2200 units and increase rate to 1900 units/hr. Will recheck HL in 6 hours Confirmed with RN that drip is running at 1650 and has not been stopped today   7/3: Heparin level at 0235 is 0.27 (subtherapeutic). Will give 1100 units bolus and increase heparin infusion rate to 2000 units/hr. HL ordered in 6 hours. Confirmed with RN that heparin was infusing and there had been no issues or interruptions.  7/3: Heparin level at 0935 now therapeutic at 0.34. Pt being transitioned to full dose enoxaparin per MD. Per RN, no s/sx of bleeding noted at this time.   Rocky Morel, PharmD Clinical Pharmacist 11/09/2015,10:56 AM

## 2015-11-09 NOTE — Care Management Obs Status (Signed)
[facility] NOTIFICATION   Patient Details  Name: Zamari Vea MRN: [id number] Date of Birth: 12-26-1950   Medicare Observation Status Notification Given:  Yes    Maor Meckel A, RN 11/09/2015, 8:50 AM

## 2015-11-09 NOTE — Discharge Summary (Signed)
Sean Hall at [place] NAME: Sean Hall    MR#:  [id number]  DATE OF BIRTH:  18-Apr-1951  DATE OF ADMISSION:  11/07/2015 ADMITTING PHYSICIAN: Lance Coon, MD  DATE OF DISCHARGE: 11/09/15 PRIMARY CARE PHYSICIAN: No PCP Per Patient    ADMISSION DIAGNOSIS:  Deep vein thrombosis (DVT) of proximal vein of both lower extremities, unspecified chronicity (HCC) [I82.4Y3]  DISCHARGE DIAGNOSIS:  Left LE DVT H/o lung cancer SECONDARY DIAGNOSIS:   Past Medical History  Diagnosis Date  . Atrial fibrillation ([place])   . CHF (congestive heart failure) ([place])   . Hypertension   . Disease of lung   . COPD (chronic obstructive pulmonary disease) ([place])   . Pneumonia   . Chronic kidney disease   . Cancer ([place]) 2017    Lung  . Dysrhythmia   . Headache   . Arthritis   . History of chemotherapy   . History of radiation therapy     HOSPITAL COURSE:  Sean Hall is a 65 y.o. male who presents with Left leg pain for 1 day. Patient has a known history of lung cancer and is currently being treated with chemotherapy and radiation. This morning when he woke up he had left leg pain.   1. Acute extensive bilateral DVT (deep venous thrombosis) (HCC) Left > right - anticoagulation with IV heparin -risk factor Lung cancer -spoke with Vascular sx for possible thrombolysis. Dr Trula Slade d/w dr schnier And patient is scheduled for left lower extremity thrombolysis on [place] date July 5 at 1:15 PM. -Patient is switched to Lovenox 1 mg/kg twice a day subcutaneous. Prescription given. Education would be done by Therapist, sports. -will change to subcutaneous Lovenox twice a day dosing until he is done with the stomal nurses thereafter can be changed to either by mouth eliquis or xarelto by dr schnier  2.Chronic obstructive pulmonary disease (HCC) - continue home meds including home inhaler -chronic home oxygen  3.HTN - continue home meds, currently stable  4.Lung cancer  ([place]) - likely the cause of his DVT formation, consult oncology as above in addition to treating DVT as above -pt getting chemo thru the cancer center  5 Chronic kidney disease - currently stable, avoid nephrotoxins Above was discussed with patient patient's friend. Dr. Patrecia Pace aware of it as well  Case discussed with Care Management/Social Worker. Management plans discussed with the patient, family and they are in agreement.  CODE STATUS: full  DVT Prophylaxis: lovenox TOTAL TIME TAKING CARE OF THIS PATIENT: 30 minutes.  >50% time spent on counselling and coordination of care  D/c home  CONSULTS OBTAINED:  Treatment Team:  Cammie Sickle, MD Serafina Mitchell, MD Katha Cabal, MD  DRUG ALLERGIES:   Allergies  Allergen Reactions  . Aleve [Naproxen Sodium] Hives, Shortness Of Breath and Swelling    Naproxin    DISCHARGE MEDICATIONS:   Current Discharge Medication List    START taking these medications   Details  enoxaparin (LOVENOX) 150 MG/ML injection Inject 0.49 mLs (75 mg total) into the skin every 12 (twelve) hours. Qty: 14 Syringe, Refills: 0      CONTINUE these medications which have NOT CHANGED   Details  albuterol (PROAIR HFA) 108 (90 Base) MCG/ACT inhaler Inhale 1-2 puffs into the lungs every 4 (four) hours as needed. Qty: 1 Inhaler, Refills: 0   Associated Diagnoses: Malignant neoplasm of upper lobe of right lung (HCC)    benzonatate (TESSALON) 200 MG capsule Take 200  mg by mouth 3 (three) times daily as needed for cough.    HYDROcodone-acetaminophen (NORCO/VICODIN) 5-325 MG tablet Take 1 tablet by mouth every 4 (four) hours as needed for moderate pain. Qty: 60 tablet, Refills: 0    lidocaine-prilocaine (EMLA) cream Apply to affected area once Qty: 30 g, Refills: 3   Associated Diagnoses: Malignant neoplasm of upper lobe of right lung (HCC)    Multiple Vitamin (MULTI-VITAMINS) TABS Take 1 tablet by mouth daily.     ondansetron (ZOFRAN) 8  MG tablet Take 1 tablet (8 mg total) by mouth 2 (two) times daily as needed for refractory nausea / vomiting. Start on day 3 after chemo. Qty: 30 tablet, Refills: 1   Associated Diagnoses: Malignant neoplasm of upper lobe of right lung (HCC)    prochlorperazine (COMPAZINE) 10 MG tablet Take 1 tablet (10 mg total) by mouth every 6 (six) hours as needed (Nausea or vomiting). Qty: 30 tablet, Refills: 1   Associated Diagnoses: Malignant neoplasm of upper lobe of right lung ([place])        If you experience worsening of your admission symptoms, develop shortness of breath, life threatening emergency, suicidal or homicidal thoughts you must seek medical attention immediately by calling 911 or calling your MD immediately  if symptoms less severe.  You Must read complete instructions/literature along with all the possible adverse reactions/side effects for all the Medicines you take and that have been prescribed to you. Take any new Medicines after you have completely understood and accept all the possible adverse reactions/side effects.   Please note  You were cared for by a hospitalist during your hospital stay. If you have any questions about your discharge medications or the care you received while you were in the hospital after you are discharged, you can call the unit and asked to speak with the hospitalist on call if the hospitalist that took care of you is not available. Once you are discharged, your primary care physician will handle any further medical issues. Please note that NO REFILLS for any discharge medications will be authorized once you are discharged, as it is imperative that you return to your primary care physician (or establish a relationship with a primary care physician if you do not have one) for your aftercare needs so that they can reassess your need for medications and monitor your lab values.   DATA REVIEW:   CBC   Recent Labs Lab 11/09/15 0235  WBC 9.8  HGB 9.0*  HCT  27.7*  PLT 219    Chemistries   Recent Labs Lab 11/04/15 0813  11/08/15 0344  NA 130*  < > 131*  K 4.2  < > 3.7  CL 96*  < > 97*  CO2 26  < > 27  GLUCOSE 134*  < > 167*  BUN 12  < > 14  CREATININE 0.74  < > 0.68  CALCIUM 8.5*  < > 8.3*  AST 15  --   --   ALT 15*  --   --   ALKPHOS 162*  --   --   BILITOT 0.4  --   --   < > = values in this interval not displayed.  Microbiology Results   No results found for this or any previous visit (from the past 240 hour(s)).  RADIOLOGY:  US Venous Img Lower Unilateral Left  11/07/2015  CLINICAL DATA:  Left leg swelling since yesterday. EXAM: Left LOWER EXTREMITY VENOUS DOPPLER ULTRASOUND TECHNIQUE: Gray-scale sonography with graded compression,  as well as color Doppler and duplex ultrasound were performed to evaluate the lower extremity deep venous systems from the level of the common femoral vein and including the common femoral, femoral, profunda femoral, popliteal and calf veins including the posterior tibial, peroneal and gastrocnemius veins when visible. The superficial great saphenous vein was also interrogated. Spectral Doppler was utilized to evaluate flow at rest and with distal augmentation maneuvers in the common femoral, femoral and popliteal veins. COMPARISON:  None. FINDINGS: Contralateral Common Femoral Vein: Nonocclusive thrombus. Common Femoral Vein: Nonocclusive thrombus. Saphenofemoral Junction: Nonocclusive thrombus Profunda Femoral Vein: Occlusive thrombus. Femoral Vein: Occlusive thrombus Popliteal Vein: Occlusive thrombus Calf Veins: Occlusive thrombus Superficial Great Saphenous Vein: Nonocclusive thrombus. IMPRESSION: Study is positive for deep venous thrombosis bilaterally. Nonocclusive thrombus is identified in the left common femoral vein with occlusive thrombus extending down the left femoral, profunda, popliteal, and calf veins. Nonocclusive thrombus identified in the contralateral (right) common femoral vein. Critical  Value/emergent results were called by me at the time of interpretation on 11/07/2015 at 9:07 pm to Dr. Marcelene Butte, who verbally acknowledged these results. Electronically Signed   By: Misty Stanley M.D.   On: 11/07/2015 21:08     Management plans discussed with the patient, family and they are in agreement.  CODE STATUS:     Code Status Orders        Start     Ordered   11/07/15 2332  Full code   Continuous     11/07/15 2331    Code Status History    Date Active Date Inactive Code Status Order ID Comments User Context   This patient has a current code status but no historical code status.      TOTAL TIME TAKING CARE OF THIS PATIENT: 40 minutes.    Bekah Igoe M.D on 11/09/2015 at 1:38 PM  Between 7am to 6pm - Pager - [phone] After 6pm go to [email or web address] - password [email or web address] [facility]  [place] [facility]  Office  [phone]  CC: Primary care physician; No PCP Per Patient

## 2015-11-09 NOTE — Progress Notes (Addendum)
Pt is being discharged home. Discharge papers given and explained to pt. Lovenox education kit given and explained to pt with teach back demonstration. Lovenox dose for tonight given to pt. Pt instructed to administer Lovenox tonight around 2200. Pt verbalized understanding. LLE thrombolysis pre -procedure instructions given and explained to pt. Pt verbalized understanding. Walker given to pt. RX for Lovenox given by care manager. F/U appointments and meds reviewed with pt.

## 2015-11-09 NOTE — Progress Notes (Signed)
Patient ID: Sean Hall, male   DOB: 05-Jun-1950, 65 y.o.   MRN: [id number] [facility] [place] NAME: Sean Hall    MR#:  [id number]  DATE OF BIRTH:  [birth year]  SUBJECTIVE:  Came in with leg leg pain and swelling.  on heparin gtt for left LE DVT Leg pain improvng REVIEW OF SYSTEMS:   Review of Systems  Constitutional: Negative for fever, chills and weight loss.  HENT: Negative for ear discharge, ear pain and nosebleeds.   Eyes: Negative for blurred vision, pain and discharge.  Respiratory: Positive for shortness of breath. Negative for sputum production, wheezing and stridor.   Cardiovascular: Negative for chest pain, palpitations, orthopnea and PND.  Gastrointestinal: Negative for nausea, vomiting, abdominal pain, diarrhea, blood in stool and melena.  Genitourinary: Negative for urgency and frequency.  Musculoskeletal: Positive for myalgias. Negative for back pain and joint pain.  Neurological: Positive for weakness. Negative for sensory change, speech change and focal weakness.  Psychiatric/Behavioral: Negative for depression and hallucinations. The patient is not nervous/anxious.   All other systems reviewed and are negative.  Tolerating Diet:yes Tolerating PT: pending  DRUG ALLERGIES:   Allergies  Allergen Reactions  . Aleve [Naproxen Sodium] Hives, Shortness Of Breath and Swelling    Naproxin    VITALS:  Blood pressure 125/56, pulse 102, temperature 98.4 F (36.9 C), temperature source Oral, resp. rate 18, height '6\' 3"'$  (1.905 m), weight 73.936 kg (163 lb), SpO2 96 %.  PHYSICAL EXAMINATION:   Physical Exam  GENERAL:  65 y.o.-year-old patient lying in the bed with no acute distress.  EYES: Pupils equal, round, reactive to light and accommodation. No scleral icterus. Extraocular muscles intact.  HEENT: Head atraumatic, normocephalic. Oropharynx and nasopharynx clear.  NECK:  Supple, no jugular venous distention.  No thyroid enlargement, no tenderness.  LUNGS: Normal breath sounds bilaterally, no wheezing, rales, rhonchi. No use of accessory muscles of respiration.  CARDIOVASCULAR: S1, S2 normal. No murmurs, rubs, or gallops.  ABDOMEN: Soft, nontender, nondistended. Bowel sounds present. No organomegaly or mass.  EXTREMITIES: No cyanosis, clubbing or edema b/l.   Left LE edema+ NEUROLOGIC: Cranial nerves II through XII are intact. No focal Motor or sensory deficits b/l.   PSYCHIATRIC:  patient is alert and oriented x 3.  SKIN: No obvious rash, lesion, or ulcer.   LABORATORY PANEL:  CBC  Recent Labs Lab 11/09/15 0235  WBC 9.8  HGB 9.0*  HCT 27.7*  PLT 219    Chemistries   Recent Labs Lab 11/04/15 0813  11/08/15 0344  NA 130*  < > 131*  K 4.2  < > 3.7  CL 96*  < > 97*  CO2 26  < > 27  GLUCOSE 134*  < > 167*  BUN 12  < > 14  CREATININE 0.74  < > 0.68  CALCIUM 8.5*  < > 8.3*  AST 15  --   --   ALT 15*  --   --   ALKPHOS 162*  --   --   BILITOT 0.4  --   --   < > = values in this interval not displayed. Cardiac Enzymes No results for input(s): TROPONINI in the last 168 hours. RADIOLOGY:  US Venous Img Lower Unilateral Left  11/07/2015  CLINICAL DATA:  Left leg swelling since yesterday. EXAM: Left LOWER EXTREMITY VENOUS DOPPLER ULTRASOUND TECHNIQUE: Gray-scale sonography with graded compression, as well as color Doppler and duplex ultrasound were performed to evaluate  the lower extremity deep venous systems from the level of the common femoral vein and including the common femoral, femoral, profunda femoral, popliteal and calf veins including the posterior tibial, peroneal and gastrocnemius veins when visible. The superficial great saphenous vein was also interrogated. Spectral Doppler was utilized to evaluate flow at rest and with distal augmentation maneuvers in the common femoral, femoral and popliteal veins. COMPARISON:  None. FINDINGS: Contralateral Common Femoral Vein: Nonocclusive  thrombus. Common Femoral Vein: Nonocclusive thrombus. Saphenofemoral Junction: Nonocclusive thrombus Profunda Femoral Vein: Occlusive thrombus. Femoral Vein: Occlusive thrombus Popliteal Vein: Occlusive thrombus Calf Veins: Occlusive thrombus Superficial Great Saphenous Vein: Nonocclusive thrombus. IMPRESSION: Study is positive for deep venous thrombosis bilaterally. Nonocclusive thrombus is identified in the left common femoral vein with occlusive thrombus extending down the left femoral, profunda, popliteal, and calf veins. Nonocclusive thrombus identified in the contralateral (right) common femoral vein. Critical Value/emergent results were called by me at the time of interpretation on 11/07/2015 at 9:07 pm to Dr. Marcelene Butte, who verbally acknowledged these results. Electronically Signed   By: Misty Stanley M.D.   On: 11/07/2015 21:08   ASSESSMENT AND PLAN:   Sean Hall is a 65 y.o. male who presents with Left leg pain for 1 day. Patient has a known history of lung cancer and is currently being treated with chemotherapy and radiation. This morning when he woke up he had left leg pain.   1. Acute extensive bilateral DVT (deep venous thrombosis) (HCC)  Left > right - anticoagulation with IV heparin -risk factor Lung cancer -spoke with Vascular sx for possible thrombolysis. Dr Trula Slade d/w dr schnier And patient is scheduled for left lower extremity thrombolysis on [place] date July 5 at 1:15 PM. -Patient is switched to Lovenox 1 mg/kg twice a day subcutaneous. Prescription given. Education would be done by Therapist, sports. -will change to subcutaneous Lovenox twice a day dosing until he is done with the stomal nurses thereafter can be changed to either by mouth eliquis or xarelto by dr schnier  2.Chronic obstructive pulmonary disease (HCC) - continue home meds including home inhaler -chronic home oxygen  3.HTN - continue home meds, currently stable  4.Lung cancer ([place]) - likely the cause of his DVT formation,  consult oncology as above in addition to treating DVT as above -pt getting chemo thru the cancer center  5 Chronic kidney disease - currently stable, avoid nephrotoxins Above was discussed with patient patient's friend. Dr. Patrecia Pace aware of it as well  Case discussed with Care Management/Social Worker. Management plans discussed with the patient, family and they are in agreement.  CODE STATUS: full  DVT Prophylaxis: lovenox TOTAL TIME TAKING CARE OF THIS PATIENT: 30 minutes.  >50% time spent on counselling and coordination of care  D/c home  Note: This dictation was prepared with Dragon dictation along with smaller phrase technology. Any transcriptional errors that result from this process are unintentional.  Persais Ethridge M.D on 11/09/2015 at 1:26 PM  Between 7am to 6pm - Pager - [phone]  After 6pm go to [email or web address] - password [email or web address]  [place] [facility]  Office  [phone]  CC: Primary care physician; No PCP Per Patient

## 2015-11-09 NOTE — Evaluation (Signed)
Physical Therapy Evaluation Patient Details Name: Sean Hall MRN: [id number] DOB: [birth year] Today's Date: 11/09/2015   History of Present Illness  Anthone Hall is a 65yo white male who comes to [facility] after acute onset BLE pain and LLE edema. Pt found to have a large LLE DVT and smaller RLE DVT. PT has been anticoagulated and PT is asked to evaluate for gait training in prepatration for DC. Pt has LungCA and is currently undergoing chemo and radiation. He is scheduled to return later this week to consult with vascular.   Clinical Impression  Pt received in bed with significant other in room. He continues to C/O severe pain in the LLE, and mild-moderate pain in the RLE. He demonstrates bed mobility and transfers with modified independence. He ambulates with a RW at a limited distance due to pain only, no balance or fatigue complaints noted, but he is confident that this distance will easily allow him to access his home entry from the car, and anywhere in his house room-to-room. His SaO2 drops to 89% on 2L/min during gait trial, but recovers to resting levels within 15s. His HR is tachy upon arrival in 100's BPM, and increases to 120's with minimal exertion. Pt denies any concerns about accessing the home safely at his current level of function. He declines any additional need for PT follow up s/p DC and is encouraged to tell PCP if he would like additional PT in the future. He is asked to avoid sedentary behaviors at home, and to pump ankles frequently  While resting seated or recumbent. Pt will benefit from skilled PT intervention to restore functional mobility to PLOF.      Follow Up Recommendations No PT follow up (Discussed talking to MD about PT referral in future if he feels needed for CA related fatigue or balance impairment.  )    Equipment Recommendations  Rolling walker with 5" wheels    Recommendations for Other Services       Precautions / Restrictions Precautions Precautions:  None Restrictions Weight Bearing Restrictions: No      Mobility  Bed Mobility Overal bed mobility: Independent                Transfers Overall transfer level: Modified independent Equipment used: Rolling walker (2 wheeled)             General transfer comment: Very painful but steady; pt demonstrates LLE NWB to his own comfort and guarding (not medically mandated.)   Ambulation/Gait Ambulation/Gait assistance: Min guard Ambulation Distance (Feet): 40 Feet Assistive device: Rolling walker (2 wheeled)     Gait velocity interpretation: <1.8 ft/sec, indicative of risk for recurrent falls General Gait Details: RLE hop-to gait with RW, NWB on LLE per pt preference. multiple stops due to pain.   Stairs            Wheelchair Mobility    Modified Rankin (Stroke Patients Only)       Balance Overall balance assessment: Modified Independent;No apparent balance deficits (not formally assessed)                                           Pertinent Vitals/Pain Pain Assessment: 0-10 Pain Score: 8  Pain Location: 8/10 on LLE, and 3/10 on RLE.  Pain Descriptors / Indicators: Aching Pain Intervention(s): Limited activity within patient's tolerance;Monitored during session    Home Living Family/patient expects to  be discharged to:: Private residence Living Arrangements: Spouse/significant other Available Help at Discharge: Family Type of Home: House Home Access: Stairs to enter Entrance Stairs-Rails: None Entrance Stairs-Number of Steps: 4 partial steps with a landing.  Home Layout: Two level;Able to live on main level with bedroom/bathroom Home Equipment: None Additional Comments: Pt reports that there is a post to hold on to when going up steps.     Prior Function Level of Independence: Independent with assistive device(s)         Comments: limited tolerance to community distances, and on O2 at home (2L/min)     Hand Dominance         Extremity/Trunk Assessment   Upper Extremity Assessment: Generalized weakness;Overall WFL for tasks assessed           Lower Extremity Assessment: Overall WFL for tasks assessed         Communication   Communication: No difficulties  Cognition Arousal/Alertness: Awake/alert Behavior During Therapy: WFL for tasks assessed/performed Overall Cognitive Status: Within Functional Limits for tasks assessed                      General Comments      Exercises        Assessment/Plan    PT Assessment Patient needs continued PT services  PT Diagnosis Difficulty walking;Abnormality of gait   PT Problem List Decreased strength;Decreased activity tolerance;Decreased mobility;Decreased knowledge of use of DME;Decreased knowledge of precautions  PT Treatment Interventions DME instruction;Gait training;Stair training;Functional mobility training;Therapeutic activities;Therapeutic exercise;Balance training;Patient/family education   PT Goals (Current goals can be found in the Care Plan section) Acute Rehab PT Goals Patient Stated Goal: Return to home, and reduce his pain.  PT Goal Formulation: With patient Time For Goal Achievement: 11/23/15 Potential to Achieve Goals: Fair    Frequency Min 2X/week   Barriers to discharge        Co-evaluation               End of Session Equipment Utilized During Treatment: Gait belt;Oxygen Activity Tolerance: Patient limited by pain;Patient limited by fatigue Patient left: in bed;with bed alarm set;with family/visitor present Nurse Communication: Mobility status;Other (comment)    Functional Limitation: Mobility: Walking and moving around Mobility: Walking and Moving Around Current Status [phone]): At least 60 percent but less than 80 percent impaired, limited or restricted Mobility: Walking and Moving Around Goal Status [phone]): At least 60 percent but less than 80 percent impaired, limited or restricted    Time:  1340-1358 PT Time Calculation (min) (ACUTE ONLY): 18 min   Charges:   PT Evaluation $PT Eval Moderate Complexity: 1 Procedure PT Treatments $Gait Training: 8-22 mins   PT G Codes:   PT G-Codes **NOT FOR INPATIENT CLASS** Functional Limitation: Mobility: Walking and moving around Mobility: Walking and Moving Around Current Status ([id number]): At least 60 percent but less than 80 percent impaired, limited or restricted Mobility: Walking and Moving Around Goal Status [phone]): At least 60 percent but less than 80 percent impaired, limited or restricted    2:23 PM, 11/09/2015 Etta Grandchild, PT, DPT Physical Therapist - [place] [facility] [phone] (mobile)

## 2015-11-09 NOTE — Care Management Important Message (Deleted)
Important Message  Patient Details  Name: Sean Hall MRN: [id number] Date of Birth: [birth year]   Medicare Important Message Given:  Yes    Tyshika Baldridge A, RN 11/09/2015, 7:20 AM

## 2015-11-09 NOTE — Care Management Note (Signed)
Case Management Note  Patient Details  Name: Sean Hall MRN: [id number] Date of Birth: [birth year]  Subjective/Objective:    Provided and discussed MOON letter with Sean Hall. Currently receiving continuous Heparin 1,900units/hr IV gtt and receiving MSo4  '2mg'$  IV PRN for pain.                 Action/Plan:   Expected Discharge Date:                  Expected Discharge Plan:     In-House Referral:     Discharge planning Services     Post Acute Care Choice:    Choice offered to:     DME Arranged:    DME Agency:     HH Arranged:    HH Agency:     Status of Service:     If discussed at [employer] of Stay Meetings, dates discussed:    Additional Comments:  Ekam Bonebrake A, RN 11/09/2015, 8:51 AM

## 2015-11-09 NOTE — Care Management Note (Signed)
Case Management Note  Patient Details  Name: Sean Hall MRN: [id number] Date of Birth: 1951-03-19  Subjective/Objective:     Uninsured [name] is being discharged home today with a prescription for Lovenox SQ. Discussed with Dr Posey Pronto that [name] reports that he cannot afford $120 Lovenox. The decision was made to provide [name] with a [place] Program coupon to avoid him having to wait at the [facility] or [facility] with his bilateral lower leg DVTs. This Probation officer explained to [name] that he must take the [facility] Program coupon and his Lovenox prescription to one of the pharmacies on the list attached to the coupon because these are the only pharnacies which participate in the [place]. [name] verbalized understanding.             Action/Plan:   Expected Discharge Date:                  Expected Discharge Plan:     In-House Referral:     Discharge planning Services     Post Acute Care Choice:    Choice offered to:     DME Arranged:    DME Agency:     HH Arranged:    HH Agency:     Status of Service:     If discussed at [employer] of Stay Meetings, dates discussed:    Additional Comments:  Ladanian Kelter A, RN 11/09/2015, 12:58 PM

## 2015-11-09 NOTE — Progress Notes (Addendum)
ANTICOAGULATION CONSULT NOTE -follow up [facility] for Heparin Indication: DVT  Allergies  Allergen Reactions  . Aleve [Naproxen Sodium] Hives, Shortness Of Breath and Swelling    Naproxin    Patient Measurements: Height: '6\' 3"'$  (190.5 cm) Weight: 163 lb (73.936 kg) IBW/kg (Calculated) : 84.5 Heparin Dosing Weight: 73.9 kg  Vital Signs: Temp: 98.6 F (37 C) (07/02 2055) Temp Source: Oral (07/02 2055) BP: 136/76 mmHg (07/02 2055) Pulse Rate: 105 (07/02 2055)  Labs:  Recent Labs  11/07/15 1826  11/07/15 1853  11/08/15 0344 11/08/15 1116 11/08/15 1850 11/09/15 0235  HGB  --   < > 9.8*  --  9.6*  --   --  9.0*  HCT  --   --  30.4*  --  28.7*  --   --  27.7*  PLT  --   --  178  --  183  --   --  219  APTT  --   --  34  --   --   --   --   --   LABPROT  --   --  16.0*  --   --   --   --   --   INR  --   --  1.27  --   --   --   --   --   HEPARINUNFRC  --   --   --   < > 0.11* <0.10* 0.14* 0.27*  CREATININE 0.72  --   --   --  0.68  --   --   --   < > = values in this interval not displayed.  Estimated Creatinine Clearance: 97.5 mL/min (by C-G formula based on Cr of 0.68).   Medical History: Past Medical History  Diagnosis Date  . Atrial fibrillation ([place])   . CHF (congestive heart failure) ([place])   . Hypertension   . Disease of lung   . COPD (chronic obstructive pulmonary disease) ([place])   . Pneumonia   . Chronic kidney disease   . Cancer ([place]) 2017    Lung  . Dysrhythmia   . Headache   . Arthritis   . History of chemotherapy   . History of radiation therapy     Medications:  Prescriptions prior to admission  Medication Sig Dispense Refill Last Dose  . albuterol (PROAIR HFA) 108 (90 Base) MCG/ACT inhaler Inhale 1-2 puffs into the lungs every 4 (four) hours as needed. 1 Inhaler 0 Taking  . benzonatate (TESSALON) 200 MG capsule Take 200 mg by mouth 3 (three) times daily as needed for cough.   Taking  . HYDROcodone-acetaminophen  (NORCO/VICODIN) 5-325 MG tablet Take 1 tablet by mouth every 4 (four) hours as needed for moderate pain. 60 tablet 0 Taking  . lidocaine-prilocaine (EMLA) cream Apply to affected area once 30 g 3 Taking  . Multiple Vitamin (MULTI-VITAMINS) TABS Take 1 tablet by mouth daily.    Taking  . ondansetron (ZOFRAN) 8 MG tablet Take 1 tablet (8 mg total) by mouth 2 (two) times daily as needed for refractory nausea / vomiting. Start on day 3 after chemo. 30 tablet 1 Taking  . prochlorperazine (COMPAZINE) 10 MG tablet Take 1 tablet (10 mg total) by mouth every 6 (six) hours as needed (Nausea or vomiting). 30 tablet 1 Taking   Scheduled:  . antiseptic oral rinse  7 mL Mouth Rinse BID  . heparin  1,100 Units Intravenous Once   Infusions:  . heparin  Assessment: 65 y/o M admitted with bilateral DVT. No anticoagulants PTA per history and per RN.   Goal of Therapy:  Heparin level 0.3-0.7 units/ml Monitor platelets by anticoagulation protocol: Yes   Plan:  Give 4400 units bolus x 1 Start heparin infusion at 1250 units/hr Check anti-Xa level in 6 hours and daily while on heparin Continue to monitor H&H and platelets   7/2 AM: HL of 0.11 is subtherapeutic. Per RN no issues or interruptions in infusion. Will give bolus of 2250 units and increase rate to 1400 units/hr. Heparin level ordered in 6 hours.  7/2 Heparin level at 1116= <0.10. Confirmed with RN that Heparin drip is running at 1400 units/hr. Will give bolus of 2200 units and increase drip to 1650 units/hr. REcheck of Heparin level in 6 hrs at 1830.  7/2 HL at 1850= 0.14. Will bolus 2200 units and increase rate to 1900 units/hr. Will recheck HL in 6 hours Confirmed with RN that drip is running at 1650 and has not been stopped today   7/3: Heparin level at 0235 is 0.27 (subtherapeutic). Will give 1100 units bolus and increase heparin infusion rate to 2000 units/hr. HL ordered in 6 hours. Confirmed with RN that heparin was infusing and there  had been no issues or interruptions.  Lenis Noon, PharmD Clinical Pharmacist 11/09/2015,3:16 AM

## 2015-11-09 NOTE — Consult Note (Signed)
Consult Note  Patient name: Sean Hall MRN: [id number] DOB: 1950/09/14 Sex: male  Consulting Physician:  Dr. Posey Pronto  Reason for Consult:  Chief Complaint  Patient presents with  . Leg Pain    HISTORY OF PRESENT ILLNESS: 65 year old male who presented to the ER with 1 day history of left leg pain.  Ultrasound identified a extensive DVT.  He was admitted for anticoagulation.  He has a history of stage IIIb lung cancer, currently undergoing chemotherapy.  The patient is a former smoker.  He ahs COPD, not on O2.    Past Medical History  Diagnosis Date  . Atrial fibrillation ([place])   . CHF (congestive heart failure) ([place])   . Hypertension   . Disease of lung   . COPD (chronic obstructive pulmonary disease) ([place])   . Pneumonia   . Chronic kidney disease   . Cancer ([place]) 2017    Lung  . Dysrhythmia   . Headache   . Arthritis   . History of chemotherapy   . History of radiation therapy     Past Surgical History  Procedure Laterality Date  . Knee surgery Bilateral   . Tonsillectomy    . Portacath placement N/A 10/29/2015    Procedure: INSERTION PORT-A-CATH;  Surgeon: Nestor Lewandowsky, MD;  Location: [facility] ORS;  Service: General;  Laterality: N/A;    Social History   Social History  . Marital Status: Single    Spouse Name: N/A  . Number of Children: N/A  . Years of Education: N/A   Occupational History  . Not on file.   Social History Main Topics  . Smoking status: Former Smoker -- 2.00 packs/day for 50 years    Types: Cigarettes    Quit date: 08/07/2013  . Smokeless tobacco: Never Used  . Alcohol Use: 0.0 oz/week    0 Standard drinks or equivalent per week     Comment: rarely  . Drug Use: No  . Sexual Activity: Not on file   Other Topics Concern  . Not on file   Social History Narrative    Family History  Problem Relation Age of Onset  . Diabetes Father   . Hypertension Father     Allergies as of 11/07/2015 - Review Complete 11/07/2015  Allergen  Reaction Noted  . Aleve [naproxen sodium] Hives, Shortness Of Breath, and Swelling 09/10/2015    No current facility-administered medications on file prior to encounter.   Current Outpatient Prescriptions on File Prior to Encounter  Medication Sig Dispense Refill  . albuterol (PROAIR HFA) 108 (90 Base) MCG/ACT inhaler Inhale 1-2 puffs into the lungs every 4 (four) hours as needed. 1 Inhaler 0  . benzonatate (TESSALON) 200 MG capsule Take 200 mg by mouth 3 (three) times daily as needed for cough.    [place] Kitchen HYDROcodone-acetaminophen (NORCO/VICODIN) 5-325 MG tablet Take 1 tablet by mouth every 4 (four) hours as needed for moderate pain. 60 tablet 0  . lidocaine-prilocaine (EMLA) cream Apply to affected area once 30 g 3  . Multiple Vitamin (MULTI-VITAMINS) TABS Take 1 tablet by mouth daily.     . ondansetron (ZOFRAN) 8 MG tablet Take 1 tablet (8 mg total) by mouth 2 (two) times daily as needed for refractory nausea / vomiting. Start on day 3 after chemo. 30 tablet 1  . prochlorperazine (COMPAZINE) 10 MG tablet Take 1 tablet (10 mg total) by mouth every 6 (six) hours as needed (Nausea or vomiting). 30 tablet 1  REVIEW OF SYSTEMS: Constitutional: Negative for fever, chills, weight loss and malaise/fatigue.  HENT: Negative for ear pain, hearing loss and tinnitus.  Eyes: Negative for blurred vision, double vision, pain and redness.  Respiratory: Negative for cough, hemoptysis and shortness of breath.  Cardiovascular: Negative for chest pain, palpitations, orthopnea and leg swelling.  Gastrointestinal: Negative for nausea, vomiting, abdominal pain, diarrhea and constipation.  Genitourinary: Negative for dysuria, frequency and hematuria.  Musculoskeletal: Negative for back pain, joint pain and neck pain.   Left lower extremity pain  Skin:   No acne, rash, or lesions  Neurological: Negative for dizziness, tremors, focal weakness and weakness.  Endo/Heme/Allergies: Negative for  polydipsia. Does not bruise/bleed easily.  Psychiatric/Behavioral: Negative for depression. The patient is not nervous/anxious and does not have insomnia.  PHYSICAL EXAMINATION: General: The patient appears their stated age.  Vital signs are BP 125/56 mmHg  Pulse 125  Temp(Src) 98.4 F (36.9 C) (Oral)  Resp 18  Ht '6\' 3"'$  (1.905 m)  Wt 163 lb (73.936 kg)  BMI 20.37 kg/m2  SpO2 89% Pulmonary: Respirations are non-labored HEENT:  No gross abnormalities Musculoskeletal: There are no major deformities.   Neurologic: No focal weakness or paresthesias are detected, Skin: There are no ulcer or rashes noted. Psychiatric: The patient has normal affect. Cardiovascular: left leg edema  Diagnostic Studies: I have reviewed his U/S with the following findings: Study is positive for deep venous thrombosis bilaterally.  Nonocclusive thrombus is identified in the left common femoral vein with occlusive thrombus extending down the left femoral, profunda, popliteal, and calf veins.  Nonocclusive thrombus identified in the contralateral (right) common femoral vein.   Assessment:  Bilateral DVT with occlusive thrombus in the left femoral and popliteal veins Plan: I discussed proceeding with chemical and mechanical thrombolysis of his left leg.  THis will be done as an outpatient on Wednesday.  He is receiving Lovenox currently with plans to convert to a NOAC after the procedure.     Eldridge Abrahams, M.D. [facility] Office: [phone] Pager:  [phone]

## 2015-11-11 ENCOUNTER — Ambulatory Visit: Payer: Medicaid Other

## 2015-11-11 ENCOUNTER — Inpatient Hospital Stay (HOSPITAL_BASED_OUTPATIENT_CLINIC_OR_DEPARTMENT_OTHER): Payer: Medicaid Other | Admitting: Oncology

## 2015-11-11 ENCOUNTER — Inpatient Hospital Stay: Payer: Medicaid Other

## 2015-11-11 ENCOUNTER — Encounter
Admission: RE | Disposition: A | Discharge status: Home / Self Care | Payer: Self-pay | Source: Ambulatory Visit | Attending: Vascular Surgery

## 2015-11-11 ENCOUNTER — Inpatient Hospital Stay: Payer: Medicaid Other | Attending: Oncology

## 2015-11-11 ENCOUNTER — Observation Stay
Admission: RE | Admit: 2015-11-11 | Discharge: 2015-11-12 | Disposition: A | Discharge status: Home / Self Care | Payer: Medicaid Other | Source: Ambulatory Visit | Attending: Vascular Surgery | Admitting: Vascular Surgery

## 2015-11-11 VITALS — BP 142/74 | HR 109 | Temp 96.8°F | Resp 18

## 2015-11-11 DIAGNOSIS — I2510 Atherosclerotic heart disease of native coronary artery without angina pectoris: Secondary | ICD-10-CM | POA: Insufficient documentation

## 2015-11-11 DIAGNOSIS — R590 Localized enlarged lymph nodes: Secondary | ICD-10-CM | POA: Insufficient documentation

## 2015-11-11 DIAGNOSIS — N189 Chronic kidney disease, unspecified: Secondary | ICD-10-CM | POA: Insufficient documentation

## 2015-11-11 DIAGNOSIS — R531 Weakness: Secondary | ICD-10-CM | POA: Insufficient documentation

## 2015-11-11 DIAGNOSIS — C3411 Malignant neoplasm of upper lobe, right bronchus or lung: Secondary | ICD-10-CM | POA: Diagnosis not present

## 2015-11-11 DIAGNOSIS — Z5111 Encounter for antineoplastic chemotherapy: Secondary | ICD-10-CM | POA: Diagnosis present

## 2015-11-11 DIAGNOSIS — K769 Liver disease, unspecified: Secondary | ICD-10-CM | POA: Diagnosis not present

## 2015-11-11 DIAGNOSIS — I82402 Acute embolism and thrombosis of unspecified deep veins of left lower extremity: Secondary | ICD-10-CM | POA: Diagnosis present

## 2015-11-11 DIAGNOSIS — Z8249 Family history of ischemic heart disease and other diseases of the circulatory system: Secondary | ICD-10-CM | POA: Insufficient documentation

## 2015-11-11 DIAGNOSIS — K4020 Bilateral inguinal hernia, without obstruction or gangrene, not specified as recurrent: Secondary | ICD-10-CM

## 2015-11-11 DIAGNOSIS — Z87891 Personal history of nicotine dependence: Secondary | ICD-10-CM

## 2015-11-11 DIAGNOSIS — I4891 Unspecified atrial fibrillation: Secondary | ICD-10-CM | POA: Diagnosis not present

## 2015-11-11 DIAGNOSIS — Z79899 Other long term (current) drug therapy: Secondary | ICD-10-CM | POA: Insufficient documentation

## 2015-11-11 DIAGNOSIS — D72829 Elevated white blood cell count, unspecified: Secondary | ICD-10-CM | POA: Diagnosis not present

## 2015-11-11 DIAGNOSIS — Z923 Personal history of irradiation: Secondary | ICD-10-CM

## 2015-11-11 DIAGNOSIS — I82432 Acute embolism and thrombosis of left popliteal vein: Principal | ICD-10-CM | POA: Insufficient documentation

## 2015-11-11 DIAGNOSIS — Z833 Family history of diabetes mellitus: Secondary | ICD-10-CM | POA: Diagnosis not present

## 2015-11-11 DIAGNOSIS — R5383 Other fatigue: Secondary | ICD-10-CM

## 2015-11-11 DIAGNOSIS — R0602 Shortness of breath: Secondary | ICD-10-CM

## 2015-11-11 DIAGNOSIS — I82412 Acute embolism and thrombosis of left femoral vein: Secondary | ICD-10-CM | POA: Insufficient documentation

## 2015-11-11 DIAGNOSIS — Z8701 Personal history of pneumonia (recurrent): Secondary | ICD-10-CM | POA: Insufficient documentation

## 2015-11-11 DIAGNOSIS — Z886 Allergy status to analgesic agent status: Secondary | ICD-10-CM | POA: Insufficient documentation

## 2015-11-11 DIAGNOSIS — R634 Abnormal weight loss: Secondary | ICD-10-CM | POA: Diagnosis not present

## 2015-11-11 DIAGNOSIS — I509 Heart failure, unspecified: Secondary | ICD-10-CM | POA: Diagnosis not present

## 2015-11-11 DIAGNOSIS — C3490 Malignant neoplasm of unspecified part of unspecified bronchus or lung: Secondary | ICD-10-CM | POA: Insufficient documentation

## 2015-11-11 DIAGNOSIS — Z9221 Personal history of antineoplastic chemotherapy: Secondary | ICD-10-CM | POA: Insufficient documentation

## 2015-11-11 DIAGNOSIS — J449 Chronic obstructive pulmonary disease, unspecified: Secondary | ICD-10-CM

## 2015-11-11 DIAGNOSIS — I498 Other specified cardiac arrhythmias: Secondary | ICD-10-CM | POA: Diagnosis not present

## 2015-11-11 DIAGNOSIS — M549 Dorsalgia, unspecified: Secondary | ICD-10-CM | POA: Diagnosis not present

## 2015-11-11 DIAGNOSIS — I129 Hypertensive chronic kidney disease with stage 1 through stage 4 chronic kidney disease, or unspecified chronic kidney disease: Secondary | ICD-10-CM

## 2015-11-11 DIAGNOSIS — R51 Headache: Secondary | ICD-10-CM | POA: Diagnosis not present

## 2015-11-11 DIAGNOSIS — I130 Hypertensive heart and chronic kidney disease with heart failure and stage 1 through stage 4 chronic kidney disease, or unspecified chronic kidney disease: Secondary | ICD-10-CM | POA: Insufficient documentation

## 2015-11-11 DIAGNOSIS — M129 Arthropathy, unspecified: Secondary | ICD-10-CM

## 2015-11-11 DIAGNOSIS — Z9889 Other specified postprocedural states: Secondary | ICD-10-CM | POA: Diagnosis not present

## 2015-11-11 DIAGNOSIS — Z7901 Long term (current) use of anticoagulants: Secondary | ICD-10-CM | POA: Insufficient documentation

## 2015-11-11 DIAGNOSIS — I82403 Acute embolism and thrombosis of unspecified deep veins of lower extremity, bilateral: Secondary | ICD-10-CM

## 2015-11-11 HISTORY — PX: PERIPHERAL VASCULAR CATHETERIZATION: SHX172C

## 2015-11-11 HISTORY — DX: Acute embolism and thrombosis of unspecified deep veins of unspecified lower extremity: I82.409

## 2015-11-11 SURGERY — THROMBECTOMY
Anesthesia: Moderate Sedation | Site: Leg Lower | Laterality: Left

## 2015-11-11 MED ORDER — ALBUTEROL SULFATE (2.5 MG/3ML) 0.083% IN NEBU: 3.0000 mL | INHALATION_SOLUTION | RESPIRATORY_TRACT | Status: DC | PRN

## 2015-11-11 MED ORDER — MIDAZOLAM HCL 2 MG/2ML IJ SOLN
INTRAMUSCULAR | Status: DC | PRN
  Administered 2015-11-11: 1 mg via INTRAVENOUS
  Administered 2015-11-11: 2 mg via INTRAVENOUS
  Administered 2015-11-11 (×2): 1 mg via INTRAVENOUS

## 2015-11-11 MED ORDER — ZOLPIDEM TARTRATE 5 MG PO TABS
5.0000 mg | ORAL_TABLET | Freq: Every evening | ORAL | Status: DC | PRN
  Filled 2015-11-11: qty 1

## 2015-11-11 MED ORDER — HYDROCODONE-ACETAMINOPHEN 5-325 MG PO TABS
1.0000 | ORAL_TABLET | ORAL | Status: DC | PRN
  Administered 2015-11-12: 1 via ORAL
  Filled 2015-11-11 (×2): qty 1

## 2015-11-11 MED ORDER — HEPARIN SODIUM (PORCINE) 1000 UNIT/ML IJ SOLN
INTRAMUSCULAR | Status: AC
  Filled 2015-11-11: qty 1

## 2015-11-11 MED ORDER — ALTEPLASE 2 MG IJ SOLR
INTRAMUSCULAR | Status: AC
  Filled 2015-11-11: qty 12

## 2015-11-11 MED ORDER — LIDOCAINE HCL (PF) 1 % IJ SOLN
INTRAMUSCULAR | Status: DC | PRN
  Administered 2015-11-11: 10 mL via INTRADERMAL

## 2015-11-11 MED ORDER — SODIUM CHLORIDE 0.9% FLUSH
10.0000 mL | INTRAVENOUS | Status: AC | PRN
  Filled 2015-11-11: qty 10

## 2015-11-11 MED ORDER — ONDANSETRON HCL 4 MG/2ML IJ SOLN: 4.0000 mg | Freq: Four times a day (QID) | INTRAMUSCULAR | Status: DC | PRN

## 2015-11-11 MED ORDER — HEPARIN (PORCINE) IN NACL 2-0.9 UNIT/ML-% IJ SOLN
INTRAMUSCULAR | Status: AC
  Filled 2015-11-11: qty 1000

## 2015-11-11 MED ORDER — DOCUSATE SODIUM 100 MG PO CAPS
100.0000 mg | ORAL_CAPSULE | Freq: Every day | ORAL | Status: DC
  Administered 2015-11-12: 100 mg via ORAL
  Filled 2015-11-11: qty 1

## 2015-11-11 MED ORDER — HEPARIN SODIUM (PORCINE) 1000 UNIT/ML IJ SOLN
INTRAMUSCULAR | Status: DC | PRN
  Administered 2015-11-11: 4000 [IU] via INTRAVENOUS

## 2015-11-11 MED ORDER — ADULT MULTIVITAMIN W/MINERALS CH
1.0000 | ORAL_TABLET | Freq: Every day | ORAL | Status: DC
  Administered 2015-11-12: 1 via ORAL
  Filled 2015-11-11: qty 1

## 2015-11-11 MED ORDER — DEXTROSE 5 % IV SOLN
INTRAVENOUS | Status: AC
  Filled 2015-11-11 (×19): qty 1.5

## 2015-11-11 MED ORDER — FENTANYL CITRATE (PF) 100 MCG/2ML IJ SOLN
INTRAMUSCULAR | Status: AC
  Filled 2015-11-11: qty 2

## 2015-11-11 MED ORDER — FENTANYL CITRATE (PF) 100 MCG/2ML IJ SOLN
INTRAMUSCULAR | Status: DC | PRN
  Administered 2015-11-11 (×4): 50 ug via INTRAVENOUS

## 2015-11-11 MED ORDER — ACETAMINOPHEN 325 MG PO TABS: 325.0000 mg | ORAL_TABLET | ORAL | Status: DC | PRN

## 2015-11-11 MED ORDER — MIDAZOLAM HCL 5 MG/5ML IJ SOLN
INTRAMUSCULAR | Status: AC
  Filled 2015-11-11: qty 5

## 2015-11-11 MED ORDER — LIDOCAINE HCL (PF) 1 % IJ SOLN
INTRAMUSCULAR | Status: AC
  Filled 2015-11-11: qty 10

## 2015-11-11 MED ORDER — HEPARIN (PORCINE) IN NACL 100-0.45 UNIT/ML-% IJ SOLN
1450.0000 [IU]/h | INTRAMUSCULAR | Status: DC
  Administered 2015-11-11: 1050 [IU]/h via INTRAVENOUS
  Filled 2015-11-11 (×3): qty 250

## 2015-11-11 MED ORDER — HYDROMORPHONE HCL 1 MG/ML IJ SOLN: 1.0000 mg | Freq: Once | INTRAMUSCULAR | Status: DC

## 2015-11-11 MED ORDER — SODIUM CHLORIDE 0.9 % IV SOLN
INTRAVENOUS | Status: DC
  Administered 2015-11-11: 14:00:00 via INTRAVENOUS

## 2015-11-11 MED ORDER — PROCHLORPERAZINE MALEATE 10 MG PO TABS
10.0000 mg | ORAL_TABLET | Freq: Four times a day (QID) | ORAL | Status: DC | PRN
  Filled 2015-11-11: qty 1

## 2015-11-11 MED ORDER — OXYCODONE HCL 5 MG PO TABS
5.0000 mg | ORAL_TABLET | ORAL | Status: DC | PRN
  Administered 2015-11-12 (×2): 10 mg via ORAL
  Filled 2015-11-11 (×2): qty 2

## 2015-11-11 MED ORDER — FAMOTIDINE 20 MG PO TABS
40.0000 mg | ORAL_TABLET | ORAL | Status: DC | PRN
Start: 2015-11-11 — End: 2015-11-11

## 2015-11-11 MED ORDER — METHYLPREDNISOLONE SODIUM SUCC 125 MG IJ SOLR: 125.0000 mg | INTRAMUSCULAR | Status: DC | PRN

## 2015-11-11 MED ORDER — MORPHINE SULFATE (PF) 4 MG/ML IV SOLN
2.0000 mg | INTRAVENOUS | Status: DC | PRN
  Administered 2015-11-11: 4 mg via INTRAVENOUS
  Administered 2015-11-11: 2 mg via INTRAVENOUS
  Administered 2015-11-12: 4 mg via INTRAVENOUS
  Filled 2015-11-11 (×3): qty 1

## 2015-11-11 MED ORDER — BENZONATATE 100 MG PO CAPS: 200.0000 mg | ORAL_CAPSULE | Freq: Three times a day (TID) | ORAL | Status: DC | PRN

## 2015-11-11 MED ORDER — ONDANSETRON HCL 4 MG PO TABS: 8.0000 mg | ORAL_TABLET | Freq: Two times a day (BID) | ORAL | Status: DC | PRN

## 2015-11-11 MED ORDER — ALUM & MAG HYDROXIDE-SIMETH 200-200-20 MG/5ML PO SUSP: 15.0000 mL | ORAL | Status: DC | PRN

## 2015-11-11 MED ORDER — DEXTROSE 5 % IV SOLN: 1.5000 g | INTRAVENOUS | Status: DC

## 2015-11-11 MED ORDER — ALTEPLASE 2 MG IJ SOLR
INTRAMUSCULAR | Status: DC | PRN
  Administered 2015-11-11: 12 mg

## 2015-11-11 MED ORDER — PANTOPRAZOLE SODIUM 40 MG PO TBEC
40.0000 mg | DELAYED_RELEASE_TABLET | Freq: Every day | ORAL | Status: DC
  Administered 2015-11-11 – 2015-11-12 (×2): 40 mg via ORAL
  Filled 2015-11-11 (×2): qty 1

## 2015-11-11 MED ORDER — HEPARIN SOD (PORK) LOCK FLUSH 100 UNIT/ML IV SOLN
500.0000 [IU] | Freq: Once | INTRAVENOUS | Status: AC
  Filled 2015-11-11: qty 5

## 2015-11-11 MED ORDER — ACETAMINOPHEN 325 MG RE SUPP
325.0000 mg | RECTAL | Status: DC | PRN
  Filled 2015-11-11: qty 2

## 2015-11-11 SURGICAL SUPPLY — 13 items
BALLN DORADO 8X100X80 (BALLOONS) ×3
CATH KA2 5FR 65CM (CATHETERS) ×3
CATH TORCON 5FR 0.38 (CATHETERS)
DEVICE PRESTO INFLATION (MISCELLANEOUS) ×3
FILTER VC CELECT-FEMORAL (Filter) ×3 IMPLANT
PACK ANGIOGRAPHY (CUSTOM PROCEDURE TRAY) ×3
SET INTRO CAPELLA COAXIAL (SET/KITS/TRAYS/PACK) ×3
SET ZELANTE DVT THROMB (CATHETERS) ×3
SHEATH BRITE TIP 5FRX11 (SHEATH) ×3
SHEATH BRITE TIP 8FRX11 (SHEATH) ×3
TUBING CONTRAST HIGH PRESS 72 (TUBING) ×3
WIRE J 3MM .035X145CM (WIRE) ×3
WIRE MAGIC TORQUE 260C (WIRE) ×3

## 2015-11-11 NOTE — Op Note (Signed)
[facility]   OPERATIVE NOTE    PRE-OPERATIVE DIAGNOSIS: Symptomatic left leg DVT; Lung Cancer  POST-OPERATIVE DIAGNOSIS: Same  PROCEDURE: 1. Ultrasound guidance for vascular access to the right common femoral vein and left popliteal vein 2. Catheter placement into the inferior vena cava for placement of IVC filter 3. Inferior venacavogram 4. Placement of a Celect IVC filter infrarenal 5. Introduction catheter into venous system second order catheter placement left popliteal approach 6. Infusion thrombolysis with 12 mg of TPA 7. Mechanical thrombectomy of the popliteal, SFV common femoral vein using the AngioJet [place] catheter 8.    Percutaneous transluminal angioplasty popliteal, common femoral and superficial femoral vein to 8 mm  SURGEON: Sean Hall, Sean Hall  ASSISTANT(S): None  ANESTHESIA: local/sedation  ESTIMATED BLOOD LOSS: minimal  Contrast: 30 cc  Fluoroscopy time: 5.8 minutes  FINDING(S): 1. Patent IVC; thrombus within the left popliteal, superficial femoral vein and common femoral vein  SPECIMEN(S): none  INDICATIONS:  Sean Hall is a 65 y.o. year old male who presents with massive swelling of the left leg quite painful in association with pleuritic chest pains and hypoxia as well as shortness of breath. Inferior vena cava filter is indicated for this reason. Risks and benefits including filter thrombosis, migration, fracture, bleeding, and infection were all discussed. We discussed that all IVC filters that we place can be removed if desired from the patient once the need for the filter has passed.   DESCRIPTION: After obtaining full informed written consent, the patient was brought back to the vascular suite. The skin was sterilely prepped and draped in a sterile fashion. The right common femoral vein was accessed under direct ultrasound guidance without difficulty with a Seldinger needle and a J-wire was  then placed. The dilator is passed over the wire and the delivery sheath was placed into the inferior vena cava. Inferior venacavogram was performed. This demonstrated a patent IVC with the level of the renal veins at L1-L2. The filter was then deployed into the inferior vena cava at the level of inferior margin of L2 just below the renal veins. The delivery sheath was then removed. Pressure was held. Sterile dressings were placed. The patient tolerated the procedure well.  He was then repositioned to the prone and his popliteal fossa of the left leg was prepped and draped in a sterile fashion. Ultrasound was placed in a sterile sleeve. Ultrasound is utilized to gain access to the popliteal vein. Vein is known to have thrombus within it by previous ultrasound. It is therefore identified by its enlarged size with echolucent thrombus and non-compressibility. 1% lidocaine is infiltrated in soft tissues and subsequent microneedle is inserted into the popliteal vein without difficulty. Images recorded for the permanent record and the puncture is made under real-time visualization. Microwire is then advanced under fluoroscopic guidance and noted to follow the path of the superficial femoral vein. Therefore the micro-sheath was placed followed by a Magic torque wire and subsequently an 8 [place] sheath. A small hand injection contrast was then performed to verify intravenous placement.  KMP catheter together with the Magic torque wire then advanced up to the level of the common iliac and hand injection contrast is utilized to demonstrate that the common iliac vein on the left is patent the inferior vena cava is known to be patent from the above filter placement. Catheter is then repositioned to the common femoral level and hand injection contrast demonstrates that there is an occlusive thrombus within the common femoral however  the external iliac vein on the left appears to be patent. Repositioning of the KMP catheter  then demonstrated thrombus within the superficial femoral vein and popliteal vein.  Zelonte catheter is then prepped on the field and 12 mg of TPA is reconstituted 100 cc. This is then laced throughout the common femoral superficial femoral and popliteal veins. The TPA is then allowed to dwell for 30 minutes. Subsequently, the AngioJet catheter is engaged in the aspiration mode and aspiration of the common femoral as well as the popliteal and superficial femoral veins is performed multiple passes are made with the volumes recorded in the procedure notes.  Follow-up imaging now demonstrates that much of the thrombus has been eliminated from the proximal popliteal as well as the superficial femoral vein. There appears to be several areas of stricture within the superficial femoral vein and common femoral vein. An 8 x 100 mm [place] balloon is then used and serial angioplasty is performed. Multiple areas of significant waste formation are noted. Follow-up imaging demonstrates there is some residual thrombus but a marked improvement and therefore the AngioJet is advanced to these levels and multiple short passes are made through these regions. After this maneuver follow-up imaging demonstrates a marked improvement within the common femoral vein and the superficial femoral vein. Iliac veins are unchanged and a magnified image of the IVC and filter demonstrates that the IVC and the filter remained widely patent with no evidence of thrombus within the filter. The wire is removed the sheath is removed pressures held and a pressure dressing with Coban and is then applied. The patient is then returned to the supine position  Interpretation: Initial images demonstrated normal vena cava filter is placed without difficulty. Initial images the left lower extremity then demonstrated extensive thrombus throughout the popliteal and femoral veins however the iliac veins remained widely patent. Following intervention described  above there is a marked reduction in thrombus thrombus throughout the left leg venous system.  COMPLICATIONS: None  CONDITION: Stable  Sean Hall, Sean Hall  11/11/2015,5:43 PM

## 2015-11-11 NOTE — H&P (Signed)
[facility] History & Physical Update  The patient was interviewed and re-examined.  The patient's previous History and Physical has been reviewed and is unchanged.  There is no change in the plan of care. We plan to proceed with the scheduled procedure.  Chevi Lim, Dolores Lory, MD  11/11/2015, 4:32 PM

## 2015-11-11 NOTE — Progress Notes (Signed)
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 Chief Complaint  Patient presents with  . Lung Cancer    INTERVAL HISTORY: Patient returns to clinic today for further evaluation and Hospital follow-up. Patient was admitted over the weekend with significant left lower leg pain and found to have a large DVT. He continues to have pain. He is currently on Lovenox injections and has a scheduled thrombectomy later this afternoon. His breathing is much improved and now only requires oxygen intermittently. He continues to feel weak and fatigued, but this is improved as well.  He has no neurologic complaints. He denies any fevers. He denies any chest pain or hemoptysis. He has a chronic cough that is unchanged.  He denies any weight loss. He denies any nausea, vomiting, consultation, or diarrhea. He has no urinary complaints. Patient offers no further specific complaints.  REVIEW OF SYSTEMS:   Review of Systems  Constitutional: Negative for fever, weight loss and malaise/fatigue.  Respiratory: Positive for cough. Negative for hemoptysis and shortness of breath.   Cardiovascular: Negative.  Negative for chest pain.  Gastrointestinal: Negative.  Negative for abdominal pain.  Genitourinary: Negative.   Musculoskeletal: Positive for back pain.       Left lower leg pain secondary to DVT  Neurological: Positive for weakness.  Psychiatric/Behavioral: The patient is not nervous/anxious and does not have insomnia.     As per HPI. Otherwise, a complete review of systems is negatve.  PAST MEDICAL HISTORY: Past Medical History  Diagnosis Date  . Atrial fibrillation  ([place])   . CHF (congestive heart failure) ([place])   . Hypertension   . Disease of lung   . COPD (chronic obstructive pulmonary disease) ([place])   . Pneumonia   . Chronic kidney disease   . Cancer ([place]) 2017    Lung  . Dysrhythmia   . Headache   . Arthritis   . History of chemotherapy   . History of radiation therapy     PAST SURGICAL HISTORY: Past Surgical History  Procedure Laterality Date  . Knee surgery Bilateral   . Tonsillectomy    . Portacath placement N/A 10/29/2015    Procedure: INSERTION PORT-A-CATH;  Surgeon: Nestor Lewandowsky, MD;  Location: [facility] ORS;  Service: General;  Laterality: N/A;    FAMILY HISTORY Family History  Problem Relation Age of Onset  . Diabetes Father   . Hypertension Father        ADVANCED DIRECTIVES:    HEALTH MAINTENANCE: Social History  Substance Use Topics  . Smoking status: Former Smoker -- 2.00 packs/day for 50 years    Types: Cigarettes    Quit date: 08/07/2013  . Smokeless tobacco: Never Used  . Alcohol Use: 0.0 oz/week    0 Standard drinks or equivalent per week     Comment: rarely     Colonoscopy:  PAP:  Bone density:  Lipid panel:  Allergies  Allergen Reactions  . Aleve [Naproxen Sodium] Hives, Shortness Of Breath and Swelling    Naproxin    Current Outpatient Prescriptions  Medication Sig Dispense Refill  . albuterol (PROAIR HFA) 108 (90 Base) MCG/ACT inhaler Inhale 1-2 puffs into the lungs every 4 (four) hours as needed. 1 Inhaler 0  .  benzonatate (TESSALON) 200 MG capsule Take 200 mg by mouth 3 (three) times daily as needed for cough.    . enoxaparin (LOVENOX) 150 MG/ML injection Inject 0.49 mLs (75 mg total) into the skin every 12 (twelve) hours. 14 Syringe 0  . HYDROcodone-acetaminophen (NORCO/VICODIN) 5-325 MG tablet Take 1 tablet by mouth every 4 (four) hours as needed for moderate pain. 60 tablet 0  . lidocaine-prilocaine (EMLA) cream Apply to affected area once 30 g 3  . Multiple Vitamin (MULTI-VITAMINS) TABS  Take 1 tablet by mouth daily.     . ondansetron (ZOFRAN) 8 MG tablet Take 1 tablet (8 mg total) by mouth 2 (two) times daily as needed for refractory nausea / vomiting. Start on day 3 after chemo. 30 tablet 1  . prochlorperazine (COMPAZINE) 10 MG tablet Take 1 tablet (10 mg total) by mouth every 6 (six) hours as needed (Nausea or vomiting). 30 tablet 1   No current facility-administered medications for this visit.   Facility-Administered Medications Ordered in Other Visits  Medication Dose Route Frequency Provider Last Rate Last Dose  . heparin lock flush 100 unit/mL  500 Units Intravenous Once Lloyd Huger, MD      . sodium chloride flush (NS) 0.9 % injection 10 mL  10 mL Intravenous PRN Lloyd Huger, MD        OBJECTIVE: Filed Vitals:   11/11/15 0841  BP: 142/74  Pulse: 109  Temp: 96.8 F (36 C)  Resp: 18     There is no weight on file to calculate BMI.    ECOG FS:1 - Symptomatic but completely ambulatory  General: Well-developed, well-nourished,  mild respiratory  distress. Eyes: Pink conjunctiva, anicteric sclera. Lungs: Scattered wheezing throughout, improved. Heart: Regular rate and rhythm. No rubs, murmurs, or gallops. Abdomen: Soft, nontender, nondistended. No organomegaly noted, normoactive bowel sounds. Musculoskeletal: Left lower extremity edema. Neuro: Alert, answering all questions appropriately. Cranial nerves grossly intact. Skin: No rashes or petechiae noted. Psych: Normal affect.   LAB RESULTS:  Lab Results  Component Value Date   NA 131* 11/08/2015   K 3.7 11/08/2015   CL 97* 11/08/2015   CO2 27 11/08/2015   GLUCOSE 167* 11/08/2015   BUN 14 11/08/2015   CREATININE 0.68 11/08/2015   CALCIUM 8.3* 11/08/2015   PROT 7.4 11/04/2015   ALBUMIN 2.7* 11/04/2015   AST 15 11/04/2015   ALT 15* 11/04/2015   ALKPHOS 162* 11/04/2015   BILITOT 0.4 11/04/2015   GFRNONAA >60 11/08/2015   GFRAA >60 11/08/2015    Lab Results  Component Value Date    WBC 9.8 11/09/2015   NEUTROABS 7.2* 11/04/2015   HGB 9.0* 11/09/2015   HCT 27.7* 11/09/2015   MCV 72.3* 11/09/2015   PLT 219 11/09/2015     STUDIES: X-ray Chest Pa Or Ap  10/29/2015  CLINICAL DATA:  Port-A-Cath placement.  Lung mass. EXAM: CHEST 1 VIEW COMPARISON:  CT chest 09/22/2015.  Chest x-ray 09/07/2015 FINDINGS: Left subclavian Port-A-Cath tip in the SVC.  No pneumothorax. Extensive right upper lobe mass has progressed in the interval. There may be adjacent infiltrate or collapse. Lower lobes not included on the study. Underlying COPD. IMPRESSION: Satisfactory Port-A-Cath placement. Progression of right upper lobe mass and atelectasis/infiltrate. Electronically Signed   By: Franchot Gallo M.D.   On: 10/29/2015 09:37   Dg Chest 2 View  11/04/2015  CLINICAL DATA:  Malignant neoplasm RIGHT upper lobe, increasing shortness of breath for 3 days, CHF, atrial fibrillation, COPD, hypertension EXAM: CHEST  2 VIEW COMPARISON:  10/29/2015 FINDINGS: LEFT subclavian Port-A-Cath with tip projecting over SVC. Stable heart size and pulmonary vascularity. Atherosclerotic calcification aorta. Chronic opacity RIGHT upper lobe corresponding to known neoplasm. RIGHT hilar enlargement due to mass/adenopathy again seen. Underlying COPD changes. Scarring in LEFT upper lobe. No definite acute infiltrate, pleural effusion or pneumothorax. IMPRESSION: RIGHT upper lobe malignancy with RIGHT hilar adenopathy/mass. COPD changes with LEFT upper lobe scarring. No acute abnormalities. Electronically Signed   By: Lavonia Dana M.D.   On: 11/04/2015 10:56   Mr Jeri Cos ZO Contrast  10/19/2015  CLINICAL DATA:  Recently diagnosed non-small cell lung cancer involving the right upper lobe. Staging. Occasional headaches. EXAM: MRI HEAD WITHOUT AND WITH CONTRAST TECHNIQUE: Multiplanar, multiecho pulse sequences of the brain and surrounding structures were obtained without and with intravenous contrast. CONTRAST:  27m MULTIHANCE  GADOBENATE DIMEGLUMINE 529 MG/ML IV SOLN COMPARISON:  None. FINDINGS: There is no evidence of acute infarct, intracranial hemorrhage, mass, midline shift, or extra-axial fluid collection. There are small, chronic cortical and subcortical infarcts in the left frontal lobe. Small foci of T2 hyperintensity elsewhere involving the white matter of the left greater than right cerebral hemispheres are nonspecific but compatible with chronic small vessel ischemic disease, mildly advanced for age. There is slight ex vacuo enlargement of the frontal horn of the left lateral ventricle. No abnormal enhancement is identified. Orbits are unremarkable. Paranasal sinuses and mastoid air cells are clear. Major intracranial vascular flow voids are preserved. No suspicious skull lesion is identified. IMPRESSION: 1. No evidence of intracranial metastatic disease. 2. Chronic ischemic changes including small chronic left frontal infarcts. Electronically Signed   By: ALogan BoresM.D.   On: 10/19/2015 09:42   Nm Pet Image Initial (pi) Skull Base To Thigh  10/15/2015  CLINICAL DATA:  Initial treatment strategy for non-small cell lung cancer. EXAM: NUCLEAR MEDICINE PET SKULL BASE TO THIGH TECHNIQUE: 12.07 mCi F-18 FDG was injected intravenously. Full-ring PET imaging was performed from the skull base to thigh after the radiotracer. CT data was obtained and used for attenuation correction and anatomic localization. FASTING BLOOD GLUCOSE:  Value: 124 mg/dl COMPARISON:  Chest CT 09/22/2015 FINDINGS: NECK No hypermetabolic lymph nodes in the neck. CHEST Large right upper lobe lung mass is markedly hypermetabolic with SUV max of 19. There is right hilar and mediastinal lymphadenopathy which is also hypermetabolic. A right epicardial lymph image number 107 measures 21 mm and has SUV max of 4.9. Sub carinal lymph node on image 1 to measures 13 mm and SUV max is 2.85. No contralateral mediastinal or hilar adenopathy. Small subpleural nodular  density on image 125 is not hypermetabolic and likely subpleural atelectasis. No definite metastatic pulmonary nodules in the left lung. Stable underlying emphysematous changes and pulmonary scarring. Small right-sided pleural effusion but no hypermetabolism or pleural nodularity. Stable three-vessel coronary artery calcifications. ABDOMEN/PELVIS No abnormal hypermetabolic activity within the liver, pancreas, adrenal glands, or spleen. No hypermetabolic lymph nodes in the abdomen or pelvis. The low-attenuation liver lesions seen on the prior CT scan is not hypermetabolic and somewhat photopenic. This is likely a benign cyst. No adrenal gland metastasis. Incidental findings include age advanced aortic and branch vessel atherosclerotic calcifications and bilateral inguinal hernias containing bowel. SKELETON No focal hypermetabolic activity to suggest skeletal metastasis. IMPRESSION: 1. Large right upper lobe lung mass consistent with known neoplasm. Mediastinal and hilar lymphadenopathy. 2. No findings for metastatic disease involving the lungs, neck, abdomen/pelvis or osseous structures. Electronically Signed   By:  Marijo Sanes M.D.   On: 10/15/2015 10:10   US Venous Img Lower Unilateral Left  11/07/2015  CLINICAL DATA:  Left leg swelling since yesterday. EXAM: Left LOWER EXTREMITY VENOUS DOPPLER ULTRASOUND TECHNIQUE: Gray-scale sonography with graded compression, as well as color Doppler and duplex ultrasound were performed to evaluate the lower extremity deep venous systems from the level of the common femoral vein and including the common femoral, femoral, profunda femoral, popliteal and calf veins including the posterior tibial, peroneal and gastrocnemius veins when visible. The superficial great saphenous vein was also interrogated. Spectral Doppler was utilized to evaluate flow at rest and with distal augmentation maneuvers in the common femoral, femoral and popliteal veins. COMPARISON:  None. FINDINGS:  Contralateral Common Femoral Vein: Nonocclusive thrombus. Common Femoral Vein: Nonocclusive thrombus. Saphenofemoral Junction: Nonocclusive thrombus Profunda Femoral Vein: Occlusive thrombus. Femoral Vein: Occlusive thrombus Popliteal Vein: Occlusive thrombus Calf Veins: Occlusive thrombus Superficial Great Saphenous Vein: Nonocclusive thrombus. IMPRESSION: Study is positive for deep venous thrombosis bilaterally. Nonocclusive thrombus is identified in the left common femoral vein with occlusive thrombus extending down the left femoral, profunda, popliteal, and calf veins. Nonocclusive thrombus identified in the contralateral (right) common femoral vein. Critical Value/emergent results were called by me at the time of interpretation on 11/07/2015 at 9:07 pm to Dr. Marcelene Butte, who verbally acknowledged these results. Electronically Signed   By: Misty Stanley M.D.   On: 11/07/2015 21:08   Dg C-arm 1-60 Min  10/29/2015  CLINICAL DATA:  Port-A-Cath placement. EXAM: DG C-ARM 61-120 MIN COMPARISON:  CT 09/22/2015 . FINDINGS: Port-A-Cath tip noted projected over the lower portion superior vena cava. Cine images obtained. 1 minutes 22 seconds fluoroscopy time. IMPRESSION: Port-A-Cath tip noted projected over the lower portion of the superior vena cava. 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  PLAN:    1. Stage III B poorly differentiated carcinoma of the right upper lobe lung: CT and PET results reviewed independently. MRI of brain negative for metastatic disease.  Pathology consistent with poorly differentiated carcinoma, but there is not enough tissue for additional molecular testing. Will delay cycle 3 weekly carboplatinum and Taxol secondary to patient having thrombectomy later today.  Continue daily XRT.  At the conclusion of his XRT, patient will likely benefit from 2 consolidation doses  of chemotherapy. Return to clinic in 1 week for reconsideration of cycle 3. 2. Venous access:  Patient had port placed last week. 3. Leukocytosis: Resolved. 4. Thrombocytosis: Resolved.  5. Back pain:  Continue Norco as needed. 6. Shortness of breath: Improved. Previously, chest x-ray as above with no obvious infiltrate. Continue inhalers as prescribed. Continue home oxygen as needed. He will likely need a pulmonary consult in the near future. 7. Bilateral DVT: Continue Lovenox with left leg thrombectomy scheduled later today. Patient will likely need to be switched to either Eliquis or Xarelto. Patient will require anticoagulation for 6 months given the extent of his blood clot.  Patient expressed understanding and was in agreement with this plan. He also understands that He can call clinic at any time with any questions, concerns, or complaints.   Lung cancer [facility])   Staging form: Lung, AJCC 7th Edition     Clinical stage from 10/17/2015: Stage IIIB (T4, N2, M0) - Signed by Lloyd Huger, MD on 10/17/2015   Lloyd Huger, MD   11/11/2015 11:09 AM

## 2015-11-11 NOTE — Progress Notes (Signed)
States was in the hospital over the weekend due to blood clot in left leg. Started on lovenox injections, discharged on 7/3. Pt scheduled for thrombectomy with Dr. Delana Meyer today at 1:15pm. Pt requests to not have labwork or treatment today due to procedure later. Having pain and swelling in left leg which started last Friday and worsened on Saturday when pt was admitted to hospital.

## 2015-11-11 NOTE — Progress Notes (Signed)
Received pt from PACU to Rm 229. Pt AOx4. VSS. No signs of acute distress. IV access intact and IVF running.  Pt on Heparin gtt.   Per orders foot of bed elevated as high as possible.  HOB 30 degrees.  Per order, pt able to resume activity as tolerated 11/12/15 at 0300.  Nurse Tech, Pt, and family educated and informed of this information.  Medications administered (See MAR). Admission completed. Assessment completed. DVT prophylaxis assessed and completed.   Education provided on call bell, bed alarm, telephone and IV/IV Pole/IV Alarms. 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 White Board was completed and updated PRN.   Dietary specification confirmed.  Pt called into dietary and ordered off Regular diet menu.  Tolerated well.     Family at bedside. Pt resting comfortably and quietly w/bed in low and locked position and call bell and telephone within reach. Will continue to monitor.

## 2015-11-11 NOTE — Progress Notes (Signed)
Heparin gtt started at 10.5 cc/hr via pump.

## 2015-11-11 NOTE — Progress Notes (Signed)
ANTICOAGULATION CONSULT NOTE - Initial Consult  Pharmacy Consult for Heparin Drip Indication: VTE, s/p thrombectomy  Allergies  Allergen Reactions  . Aleve [Naproxen Sodium] Hives, Shortness Of Breath and Swelling    Naproxin    Patient Measurements: Height: '6\' 3"'$  (190.5 cm) Weight: 163 lb (73.936 kg) IBW/kg (Calculated) : 84.5 Heparin Dosing Weight: 73.9 kg  Vital Signs: Temp: 98 F (36.7 C) (07/05 1734) Temp Source: Oral (07/05 1734) BP: 159/77 mmHg (07/05 1734) Pulse Rate: 103 (07/05 1734)  Labs:  Recent Labs  11/08/15 1850 11/09/15 0235 11/09/15 0935  HGB  --  9.0*  --   HCT  --  27.7*  --   PLT  --  219  --   HEPARINUNFRC 0.14* 0.27* 0.34    Estimated Creatinine Clearance: 97.5 mL/min (by C-G formula based on Cr of 0.68).   Medical History: Past Medical History  Diagnosis Date  . Atrial fibrillation ([place])   . CHF (congestive heart failure) ([place])   . Hypertension   . Disease of lung   . COPD (chronic obstructive pulmonary disease) ([place])   . Pneumonia   . Chronic kidney disease   . Cancer ([place]) 2017    Lung  . Dysrhythmia   . Headache   . Arthritis   . History of chemotherapy   . History of radiation therapy   . DVT (deep venous thrombosis) ([facility])     left leg    Medications:  Scheduled:  . [START ON 11/12/2015] docusate sodium  100 mg Oral Daily  . [START ON 11/12/2015] multivitamin with minerals  1 tablet Oral Daily  . pantoprazole  40 mg Oral Daily   Infusions:  . heparin 1,050 Units/hr (11/11/15 1643)    Assessment: Patient admitted for DVT, is s/p thrombectomy. Pharmacy to initiate Heparin drip with no bolus.  Goal of Therapy:  Heparin level 0.3-0.7 units/ml Monitor platelets by anticoagulation protocol: Yes   Plan:  Start heparin infusion at 1050 units/hr Check anti-Xa level in 6 hours and daily while on heparin Continue to monitor H&H and platelets  Paulina Fusi, PharmD, BCPS 11/11/2015 5:40 PM

## 2015-11-12 ENCOUNTER — Ambulatory Visit: Payer: Medicaid Other

## 2015-11-12 ENCOUNTER — Encounter: Payer: Self-pay | Admitting: Vascular Surgery

## 2015-11-12 DIAGNOSIS — I82432 Acute embolism and thrombosis of left popliteal vein: Secondary | ICD-10-CM | POA: Diagnosis not present

## 2015-11-12 LAB — CBC
HCT: 28.1 % — ABNORMAL LOW (ref 40.0–52.0)
Hemoglobin: 9.3 g/dL — ABNORMAL LOW (ref 13.0–18.0)
MCH: 24.1 pg — ABNORMAL LOW (ref 26.0–34.0)
MCHC: 33.1 g/dL (ref 32.0–36.0)
MCV: 72.9 fL — AB (ref 80.0–100.0)
PLATELETS: 262 10*3/uL (ref 150–440)
RBC: 3.85 MIL/uL — ABNORMAL LOW (ref 4.40–5.90)
RDW: 19.1 % — AB (ref 11.5–14.5)
WBC: 7.5 10*3/uL (ref 3.8–10.6)

## 2015-11-12 LAB — HEPARIN LEVEL (UNFRACTIONATED)
HEPARIN UNFRACTIONATED: 0.27 [IU]/mL — AB (ref 0.30–0.70)
Heparin Unfractionated: 0.13 IU/mL — ABNORMAL LOW (ref 0.30–0.70)

## 2015-11-12 MED ORDER — HEPARIN BOLUS VIA INFUSION
2200.0000 [IU] | Freq: Once | INTRAVENOUS | Status: DC
  Filled 2015-11-12: qty 2200

## 2015-11-12 MED ORDER — APIXABAN 5 MG PO TABS: 5.0000 mg | ORAL_TABLET | Freq: Two times a day (BID) | ORAL | Status: DC

## 2015-11-12 MED ORDER — APIXABAN 5 MG PO TABS
10.0000 mg | ORAL_TABLET | Freq: Once | ORAL | Status: AC
  Administered 2015-11-12: 10 mg via ORAL
  Filled 2015-11-12: qty 2

## 2015-11-12 MED ORDER — HEPARIN BOLUS VIA INFUSION
1100.0000 [IU] | Freq: Once | INTRAVENOUS | Status: AC
  Administered 2015-11-12: 1100 [IU] via INTRAVENOUS
  Filled 2015-11-12: qty 1100

## 2015-11-12 NOTE — Progress Notes (Signed)
ANTICOAGULATION CONSULT NOTE - Initial Consult  Pharmacy Consult for Heparin Drip Indication: VTE, s/p thrombectomy  Allergies  Allergen Reactions  . Aleve [Naproxen Sodium] Hives, Shortness Of Breath and Swelling    Naproxin    Patient Measurements: Height: '6\' 3"'$  (190.5 cm) Weight: 163 lb (73.936 kg) IBW/kg (Calculated) : 84.5 Heparin Dosing Weight: 73.9 kg  Vital Signs: Temp: 98.4 F (36.9 C) (07/05 2134) Temp Source: Oral (07/05 2134) BP: 135/69 mmHg (07/05 2134) Pulse Rate: 105 (07/05 2134)  Labs:  Recent Labs  11/09/15 0235 11/09/15 0935 11/11/15 2319  HGB 9.0*  --   --   HCT 27.7*  --   --   PLT 219  --   --   HEPARINUNFRC 0.27* 0.34 0.27*    Estimated Creatinine Clearance: 97.5 mL/min (by C-G formula based on Cr of 0.68).   Medical History: Past Medical History  Diagnosis Date  . Atrial fibrillation ([place])   . CHF (congestive heart failure) ([place])   . Hypertension   . Disease of lung   . COPD (chronic obstructive pulmonary disease) ([place])   . Pneumonia   . Chronic kidney disease   . Cancer ([place]) 2017    Lung  . Dysrhythmia   . Headache   . Arthritis   . History of chemotherapy   . History of radiation therapy   . DVT (deep venous thrombosis) ([facility])     left leg    Medications:  Scheduled:  . docusate sodium  100 mg Oral Daily  . heparin  1,100 Units Intravenous Once  . multivitamin with minerals  1 tablet Oral Daily  . pantoprazole  40 mg Oral Daily   Infusions:  . heparin 1,050 Units/hr (11/11/15 1643)    Assessment: Patient admitted for DVT, is s/p thrombectomy. Pharmacy to initiate Heparin drip with no bolus.  Goal of Therapy:  Heparin level 0.3-0.7 units/ml Monitor platelets by anticoagulation protocol: Yes   Plan:  Start heparin infusion at 1050 units/hr Check anti-Xa level in 6 hours and daily while on heparin Continue to monitor H&H and platelets   7/5:  HL @ 23:00 = 0.27 Heparin 1100 units IV X 1 bolus and increase drip  to 1200 units/hr.  Will recheck HL 6 hrs after rate change.   Deryn Massengale D 11/12/2015 12:19 AM

## 2015-11-12 NOTE — Progress Notes (Addendum)
Patient discharged to home as ordered. Heparin drip discontinued and eliquis 10 mg given as ordered. Case Manager at the bedside speaking to patient about his eliquis medication. Patient is alert and oriented no acute distress noted. Dressing to left leg is clean dry and intact. No acute distress noted.  Follow up appointments given as ordered.

## 2015-11-12 NOTE — Progress Notes (Signed)
ANTICOAGULATION CONSULT NOTE - FOLLOW UP   Pharmacy Consult for Heparin Drip Indication: VTE, s/p thrombectomy  Allergies  Allergen Reactions  . Aleve [Naproxen Sodium] Hives, Shortness Of Breath and Swelling    Naproxin    Patient Measurements: Height: '6\' 3"'$  (190.5 cm) Weight: 163 lb (73.936 kg) IBW/kg (Calculated) : 84.5 Heparin Dosing Weight: 73.9 kg  Vital Signs: Temp: 98.2 F (36.8 C) (07/06 0526) Temp Source: Oral (07/06 0526) BP: 117/64 mmHg (07/06 0526) Pulse Rate: 95 (07/06 0526)  Labs:  Recent Labs  11/11/15 2319 11/12/15 0339 11/12/15 0936  HGB  --  9.3*  --   HCT  --  28.1*  --   PLT  --  262  --   HEPARINUNFRC 0.27*  --  0.13*    Estimated Creatinine Clearance: 97.5 mL/min (by C-G formula based on Cr of 0.68).   Medical History: Past Medical History  Diagnosis Date  . Atrial fibrillation ([place])   . CHF (congestive heart failure) ([place])   . Hypertension   . Disease of lung   . COPD (chronic obstructive pulmonary disease) ([place])   . Pneumonia   . Chronic kidney disease   . Cancer ([place]) 2017    Lung  . Dysrhythmia   . Headache   . Arthritis   . History of chemotherapy   . History of radiation therapy   . DVT (deep venous thrombosis) ([facility])     left leg    Medications:  Scheduled:  . docusate sodium  100 mg Oral Daily  . heparin  2,200 Units Intravenous Once  . multivitamin with minerals  1 tablet Oral Daily  . pantoprazole  40 mg Oral Daily   Infusions:  . heparin 1,200 Units/hr (11/12/15 0128)    Assessment: Patient admitted for DVT, is s/p thrombectomy. Pharmacy to initiate Heparin drip with no bolus.  Goal of Therapy:  Heparin level 0.3-0.7 units/ml Monitor platelets by anticoagulation protocol: Yes   Plan:  Start heparin infusion at 1050 units/hr Check anti-Xa level in 6 hours and daily while on heparin Continue to monitor H&H and platelets   7/5:  HL @ 23:00 = 0.27 Heparin 1100 units IV X 1 bolus and increase drip to 1200  units/hr.  Will recheck HL 6 hrs after rate change.   7/6: HL @ 09:00= 0.13. Will give heparin bolus 2200 units and will increase heparin rate to 1450 units/hr. Will recheck HL 6 hours after rate change.   Sean Hall D 11/12/2015 11:35 AM

## 2015-11-12 NOTE — Care Management (Signed)
Patient to discharge today.  Patient lives at home with his wife. Patient admitted with DVT.  Patient has hx of CA.  Patient is self pay.  To discharge on Eliquis.  Patient states that he receives his medication via medication assistance through [employer] at [facility].  He obtains his medicine at [facility] at no charge.  I spoke with Barnabas Lister.  Eliquis will not be eligible for medication assistance, due to rx not being prescribed by MD at cancer center, and potentially other qualifications.  Patient has been provided with 30 day free trail coupon for eliquis.   Medication Management could potentially offer assistance in obtaining Eliquis, however there would be an estimated 8 week turn around time.  I have notified Dr. Carroll Kinds about Medication assistance. Per Dr Carroll Kinds, he is going to speak to patient's oncologist personally.  If they are unable to arrange assistance through the cancer center patient will be transitioned to coumadin.   I have provided the patient and application to Medication Management, and [facility].  I have also faxed a copy of eliquis prescription to Medication management for them to have on file.  RNCM signing off

## 2015-11-12 NOTE — Discharge Summary (Signed)
[facility]    Discharge Summary    Patient ID:  Sean Hall MRN: [id number] DOB/AGE: 64-Mar-1952 64 y.o.  Admit date: 11/11/2015 Discharge date: 11/12/2015 Date of Surgery: 11/11/2015 Surgeon: Surgeon(s): Katha Cabal, MD  Admission Diagnosis: LT lower extremity thrombolysis    DVT  Discharge Diagnoses:  LT lower extremity thrombolysis    DVT  Secondary Diagnoses: Past Medical History  Diagnosis Date  . Atrial fibrillation ([place])   . CHF (congestive heart failure) ([place])   . Hypertension   . Disease of lung   . COPD (chronic obstructive pulmonary disease) ([place])   . Pneumonia   . Chronic kidney disease   . Cancer ([place]) 2017    Lung  . Dysrhythmia   . Headache   . Arthritis   . History of chemotherapy   . History of radiation therapy   . DVT (deep venous thrombosis) ([facility])     left leg    Procedure(s): Thrombectomy  Discharged Condition: good  HPI:  Patient presents to the hospital with iliofemoral DVT and severely symptomatic left lower extremity.   Hospital course: On the day of admission the patient underwent thrombolytic therapy and venous intervention including angioplasty of the common femoral superficial femoral vein as well as placement of an IVC filter. He tolerated this well and was subsequently taken to the floor where his leg was elevated and a heparin drip was initiated.  Today his leg is markedly less swollen he has been ambulating with much less pain. He will be transitioned to an oral anticoagulant and is fit for discharge.  Hospital Course:  Sean Hall is a 65 y.o. male is S/P Left Procedure(s): Thrombectomy Extubated: POD # 0 Physical exam: Left leg now similarly incised to the right nontender to palpation pink and warm Post-op wounds clean, dry, intact or healing well Pt. Ambulating, voiding and taking PO diet without difficulty. Pt pain controlled with PO pain meds. Labs as  below Complications:none  Consults:     Significant Diagnostic Studies: CBC Lab Results  Component Value Date   WBC 7.5 11/12/2015   HGB 9.3* 11/12/2015   HCT 28.1* 11/12/2015   MCV 72.9* 11/12/2015   PLT 262 11/12/2015    BMET    Component Value Date/Time   NA 131* 11/08/2015 0344   NA 138 08/13/2013 0422   K 3.7 11/08/2015 0344   K 4.0 08/13/2013 0422   CL 97* 11/08/2015 0344   CL 105 08/13/2013 0422   CO2 27 11/08/2015 0344   CO2 29 08/13/2013 0422   GLUCOSE 167* 11/08/2015 0344   GLUCOSE 147* 08/13/2013 0422   BUN 14 11/08/2015 0344   BUN 30* 08/13/2013 0422   CREATININE 0.68 11/08/2015 0344   CREATININE 1.00 08/13/2013 0422   CALCIUM 8.3* 11/08/2015 0344   CALCIUM 8.0* 08/13/2013 0422   GFRNONAA >60 11/08/2015 0344   GFRNONAA >60 08/13/2013 0422   GFRAA >60 11/08/2015 0344   GFRAA >60 08/13/2013 0422   COAG Lab Results  Component Value Date   INR 1.27 11/07/2015   INR 1.26 10/29/2015   INR 1.32 10/27/2015     Disposition:  Discharge to :Home Discharge Instructions    Call MD for:  redness, tenderness, or signs of infection (pain, swelling, bleeding, redness, odor or green/yellow discharge around incision site)    Complete by:  As directed      Call MD for:  severe or increased pain, loss or decreased feeling  in affected limb(s)  Complete by:  As directed      Call MD for:  temperature >100.5    Complete by:  As directed      Discharge instructions    Complete by:  As directed   Keep legs elevated as much as possible  Avoid standing or sitting in one position for long periods of time     Driving Restrictions    Complete by:  As directed   No driving for  2 week     Lifting restrictions    Complete by:  As directed   No lifting for 2-3 weeks     Resume previous diet    Complete by:  As directed             Medication List    TAKE these medications        albuterol 108 (90 Base) MCG/ACT inhaler  Commonly known as:  PROAIR HFA   Inhale 1-2 puffs into the lungs every 4 (four) hours as needed.     apixaban 5 MG Tabs tablet  Commonly known as:  ELIQUIS  Take 1 tablet (5 mg total) by mouth 2 (two) times daily.     benzonatate 200 MG capsule  Commonly known as:  TESSALON  Take 200 mg by mouth 3 (three) times daily as needed for cough.     HYDROcodone-acetaminophen 5-325 MG tablet  Commonly known as:  NORCO/VICODIN  Take 1 tablet by mouth every 4 (four) hours as needed for moderate pain.     lidocaine-prilocaine cream  Commonly known as:  EMLA  Apply to affected area once     MULTI-VITAMINS Tabs  Take 1 tablet by mouth daily.     ondansetron 8 MG tablet  Commonly known as:  ZOFRAN  Take 1 tablet (8 mg total) by mouth 2 (two) times daily as needed for refractory nausea / vomiting. Start on day 3 after chemo.     prochlorperazine 10 MG tablet  Commonly known as:  COMPAZINE  Take 1 tablet (10 mg total) by mouth every 6 (six) hours as needed (Nausea or vomiting).       Verbal and written Discharge instructions given to the patient. Wound care per Discharge AVS     Follow-up Information    Follow up with Sean Hall, Sean Lory, MD. Go on 12/07/2015.   Specialties:  Vascular Surgery, Cardiology, Radiology, Vascular Surgery   Why:  Monday at 10:45am for follow up after procedure no studies   Contact information:   [place] [address] [phone]       Follow up with Sean Huger, MD. Go on 11/18/2015.   Specialty:  Oncology   Why:  Wednesday at 9:00am for follow up after procedure   Contact information:   [place] [address] [phone]       Signed: Katha Cabal, MD  11/12/2015, 8:36 PM

## 2015-11-13 ENCOUNTER — Ambulatory Visit: Payer: Medicaid Other

## 2015-11-16 ENCOUNTER — Ambulatory Visit: Payer: Medicaid Other

## 2015-11-16 ENCOUNTER — Ambulatory Visit
Admission: RE | Admit: 2015-11-16 | Discharge: 2015-11-16 | Disposition: A | Discharge status: Home / Self Care | Payer: Medicaid Other | Source: Ambulatory Visit | Attending: Radiation Oncology | Admitting: Radiation Oncology

## 2015-11-16 DIAGNOSIS — Z510 Encounter for antineoplastic radiation therapy: Secondary | ICD-10-CM | POA: Diagnosis not present

## 2015-11-17 ENCOUNTER — Ambulatory Visit
Admission: RE | Admit: 2015-11-17 | Discharge: 2015-11-17 | Disposition: A | Discharge status: Home / Self Care | Payer: Medicaid Other | Source: Ambulatory Visit | Attending: Radiation Oncology | Admitting: Radiation Oncology

## 2015-11-17 ENCOUNTER — Ambulatory Visit: Payer: Medicaid Other

## 2015-11-17 DIAGNOSIS — Z510 Encounter for antineoplastic radiation therapy: Secondary | ICD-10-CM | POA: Diagnosis not present

## 2015-11-18 ENCOUNTER — Ambulatory Visit: Payer: Medicaid Other

## 2015-11-18 ENCOUNTER — Encounter: Payer: Self-pay | Admitting: *Deleted

## 2015-11-18 ENCOUNTER — Inpatient Hospital Stay (HOSPITAL_BASED_OUTPATIENT_CLINIC_OR_DEPARTMENT_OTHER): Payer: Medicaid Other | Admitting: Oncology

## 2015-11-18 ENCOUNTER — Ambulatory Visit
Admission: RE | Admit: 2015-11-18 | Discharge: 2015-11-18 | Disposition: A | Discharge status: Home / Self Care | Payer: Medicaid Other | Source: Ambulatory Visit | Attending: Radiation Oncology | Admitting: Radiation Oncology

## 2015-11-18 ENCOUNTER — Inpatient Hospital Stay: Payer: Medicaid Other

## 2015-11-18 VITALS — BP 126/82 | HR 112 | Temp 95.0°F | Resp 18 | Wt 155.4 lb

## 2015-11-18 DIAGNOSIS — Z7901 Long term (current) use of anticoagulants: Secondary | ICD-10-CM

## 2015-11-18 DIAGNOSIS — R531 Weakness: Secondary | ICD-10-CM

## 2015-11-18 DIAGNOSIS — I4891 Unspecified atrial fibrillation: Secondary | ICD-10-CM

## 2015-11-18 DIAGNOSIS — C3411 Malignant neoplasm of upper lobe, right bronchus or lung: Secondary | ICD-10-CM

## 2015-11-18 DIAGNOSIS — R0602 Shortness of breath: Secondary | ICD-10-CM

## 2015-11-18 DIAGNOSIS — M549 Dorsalgia, unspecified: Secondary | ICD-10-CM

## 2015-11-18 DIAGNOSIS — Z5111 Encounter for antineoplastic chemotherapy: Secondary | ICD-10-CM | POA: Diagnosis not present

## 2015-11-18 DIAGNOSIS — Z9221 Personal history of antineoplastic chemotherapy: Secondary | ICD-10-CM

## 2015-11-18 DIAGNOSIS — N189 Chronic kidney disease, unspecified: Secondary | ICD-10-CM

## 2015-11-18 DIAGNOSIS — Z8701 Personal history of pneumonia (recurrent): Secondary | ICD-10-CM

## 2015-11-18 DIAGNOSIS — I2510 Atherosclerotic heart disease of native coronary artery without angina pectoris: Secondary | ICD-10-CM

## 2015-11-18 DIAGNOSIS — M129 Arthropathy, unspecified: Secondary | ICD-10-CM

## 2015-11-18 DIAGNOSIS — Z923 Personal history of irradiation: Secondary | ICD-10-CM

## 2015-11-18 DIAGNOSIS — K769 Liver disease, unspecified: Secondary | ICD-10-CM

## 2015-11-18 DIAGNOSIS — Z79899 Other long term (current) drug therapy: Secondary | ICD-10-CM

## 2015-11-18 DIAGNOSIS — I82412 Acute embolism and thrombosis of left femoral vein: Secondary | ICD-10-CM

## 2015-11-18 DIAGNOSIS — R5383 Other fatigue: Secondary | ICD-10-CM

## 2015-11-18 DIAGNOSIS — J449 Chronic obstructive pulmonary disease, unspecified: Secondary | ICD-10-CM

## 2015-11-18 DIAGNOSIS — I129 Hypertensive chronic kidney disease with stage 1 through stage 4 chronic kidney disease, or unspecified chronic kidney disease: Secondary | ICD-10-CM

## 2015-11-18 DIAGNOSIS — I509 Heart failure, unspecified: Secondary | ICD-10-CM

## 2015-11-18 DIAGNOSIS — D72829 Elevated white blood cell count, unspecified: Secondary | ICD-10-CM

## 2015-11-18 DIAGNOSIS — R634 Abnormal weight loss: Secondary | ICD-10-CM

## 2015-11-18 DIAGNOSIS — R590 Localized enlarged lymph nodes: Secondary | ICD-10-CM

## 2015-11-18 DIAGNOSIS — Z87891 Personal history of nicotine dependence: Secondary | ICD-10-CM

## 2015-11-18 DIAGNOSIS — Z510 Encounter for antineoplastic radiation therapy: Secondary | ICD-10-CM | POA: Diagnosis not present

## 2015-11-18 DIAGNOSIS — K4020 Bilateral inguinal hernia, without obstruction or gangrene, not specified as recurrent: Secondary | ICD-10-CM

## 2015-11-18 LAB — COMPREHENSIVE METABOLIC PANEL
ALBUMIN: 2.7 g/dL — AB (ref 3.5–5.0)
ALK PHOS: 191 U/L — AB (ref 38–126)
ALT: 12 U/L — AB (ref 17–63)
AST: 15 U/L (ref 15–41)
Anion gap: 8 (ref 5–15)
BUN: 12 mg/dL (ref 6–20)
CALCIUM: 8.8 mg/dL — AB (ref 8.9–10.3)
CHLORIDE: 99 mmol/L — AB (ref 101–111)
CO2: 25 mmol/L (ref 22–32)
CREATININE: 0.71 mg/dL (ref 0.61–1.24)
GFR calc Af Amer: >60 mL/min (ref >60)
GFR calc non Af Amer: >60 mL/min (ref >60)
GLUCOSE: 128 mg/dL — AB (ref 65–99)
Potassium: 4.2 mmol/L (ref 3.5–5.1)
SODIUM: 132 mmol/L — AB (ref 135–145)
Total Bilirubin: 0.4 mg/dL (ref 0.3–1.2)
Total Protein: 7.9 g/dL (ref 6.5–8.1)

## 2015-11-18 LAB — CBC WITH DIFFERENTIAL/PLATELET
BASOS PCT: 1 %
Basophils Absolute: 0.1 10*3/uL (ref 0–0.1)
EOS ABS: 0.2 10*3/uL (ref 0–0.7)
EOS PCT: 1 %
HCT: 30.9 % — ABNORMAL LOW (ref 40.0–52.0)
HEMOGLOBIN: 10.2 g/dL — AB (ref 13.0–18.0)
LYMPHS ABS: 0.9 10*3/uL — AB (ref 1.0–3.6)
Lymphocytes Relative: 8 %
MCH: 23.9 pg — AB (ref 26.0–34.0)
MCHC: 32.9 g/dL (ref 32.0–36.0)
MCV: 72.7 fL — ABNORMAL LOW (ref 80.0–100.0)
MONO ABS: 0.9 10*3/uL (ref 0.2–1.0)
MONOS PCT: 7 %
NEUTROS PCT: 83 %
Neutro Abs: 10.3 10*3/uL — ABNORMAL HIGH (ref 1.4–6.5)
PLATELETS: 372 10*3/uL (ref 150–440)
RBC: 4.25 MIL/uL — ABNORMAL LOW (ref 4.40–5.90)
RDW: 20.1 % — ABNORMAL HIGH (ref 11.5–14.5)
WBC: 12.5 10*3/uL — ABNORMAL HIGH (ref 3.8–10.6)

## 2015-11-18 MED ORDER — SODIUM CHLORIDE 0.9 % IV SOLN
Freq: Once | INTRAVENOUS | Status: AC
  Administered 2015-11-18: 10:00:00 via INTRAVENOUS
  Filled 2015-11-18: qty 1000

## 2015-11-18 MED ORDER — SODIUM CHLORIDE 0.9 % IV SOLN
250.8000 mg | Freq: Once | INTRAVENOUS | Status: AC
  Administered 2015-11-18: 250 mg via INTRAVENOUS
  Filled 2015-11-18: qty 25

## 2015-11-18 MED ORDER — OXYCODONE HCL 10 MG PO TABS: 10.0000 mg | ORAL_TABLET | Freq: Four times a day (QID) | ORAL | Status: DC | PRN

## 2015-11-18 MED ORDER — HEPARIN SOD (PORK) LOCK FLUSH 100 UNIT/ML IV SOLN
500.0000 [IU] | Freq: Once | INTRAVENOUS | Status: AC
  Administered 2015-11-18: 500 [IU] via INTRAVENOUS

## 2015-11-18 MED ORDER — FAMOTIDINE IN NACL 20-0.9 MG/50ML-% IV SOLN
20.0000 mg | Freq: Once | INTRAVENOUS | Status: AC
Start: 2015-11-18 — End: 2015-11-18
  Administered 2015-11-18: 20 mg via INTRAVENOUS
  Filled 2015-11-18: qty 50

## 2015-11-18 MED ORDER — PACLITAXEL CHEMO INJECTION 300 MG/50ML
45.0000 mg/m2 | Freq: Once | INTRAVENOUS | Status: AC
  Administered 2015-11-18: 90 mg via INTRAVENOUS
  Filled 2015-11-18: qty 15

## 2015-11-18 MED ORDER — DIPHENHYDRAMINE HCL 50 MG/ML IJ SOLN
25.0000 mg | Freq: Once | INTRAMUSCULAR | Status: AC
  Administered 2015-11-18: 25 mg via INTRAVENOUS
  Filled 2015-11-18: qty 1

## 2015-11-18 MED ORDER — ALBUTEROL SULFATE HFA 108 (90 BASE) MCG/ACT IN AERS: 1.0000 | INHALATION_SPRAY | RESPIRATORY_TRACT | Status: AC | PRN

## 2015-11-18 MED ORDER — PALONOSETRON HCL INJECTION 0.25 MG/5ML
0.2500 mg | Freq: Once | INTRAVENOUS | Status: AC
  Administered 2015-11-18: 0.25 mg via INTRAVENOUS
  Filled 2015-11-18: qty 5

## 2015-11-18 MED ORDER — DEXAMETHASONE SODIUM PHOSPHATE 100 MG/10ML IJ SOLN
10.0000 mg | Freq: Once | INTRAMUSCULAR | Status: AC
  Administered 2015-11-18: 10 mg via INTRAVENOUS
  Filled 2015-11-18: qty 1

## 2015-11-18 NOTE — Progress Notes (Signed)
I spoke with Barnabas Lister today regarding patient assistance for Eliquis. Barnabas Lister will assist patient in getting help with co-pays. Patient is getting other medications filled under the [employer] at [facility].

## 2015-11-18 NOTE — Progress Notes (Signed)
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  INTERVAL HISTORY: Patient returns to clinic today for further evaluation and consideration of cycle 3 of weekly carboplatinum and Taxol. He had his thrombectomy in his left lower leg last week and tolerated the procedure well. He continues to have pain. His breathing is much improved and now only requires oxygen intermittently. He continues to feel weak and fatigued, but this is improved as well.  He has no neurologic complaints. He denies any fevers. He denies any chest pain or hemoptysis. He has a chronic cough that is unchanged.  He admits to increased weight loss over the past several weeks. He denies any nausea, vomiting, consultation, or diarrhea. He has no urinary complaints. Patient offers no further specific complaints.  REVIEW OF SYSTEMS:   Review of Systems  Constitutional: Positive for weight loss and malaise/fatigue. Negative for fever.  Respiratory: Positive for cough. Negative for hemoptysis and shortness of breath.   Cardiovascular: Negative.  Negative for chest pain.  Gastrointestinal: Negative.  Negative for abdominal pain.  Genitourinary: Negative.   Musculoskeletal: Positive for back pain.       Left lower leg pain secondary to DVT  Neurological: Positive for weakness.  Psychiatric/Behavioral: The patient is not nervous/anxious and does not have insomnia.     As per HPI. Otherwise, a complete review of systems is negatve.  PAST MEDICAL HISTORY: Past Medical History  Diagnosis Date  . Atrial fibrillation ([place])   . CHF (congestive heart failure) ([place])   . Hypertension   . Disease of  lung   . COPD (chronic obstructive pulmonary disease) ([place])   . Pneumonia   . Chronic kidney disease   . Cancer ([place]) 2017    Lung  . Dysrhythmia   . Headache   . Arthritis   . History of chemotherapy   . History of radiation therapy   . DVT (deep venous thrombosis) ([facility])     left leg    PAST SURGICAL HISTORY: Past Surgical History  Procedure Laterality Date  . Knee surgery Bilateral   . Tonsillectomy    . Portacath placement N/A 10/29/2015    Procedure: INSERTION PORT-A-CATH;  Surgeon: Nestor Lewandowsky, MD;  Location: [facility] ORS;  Service: General;  Laterality: N/A;  . Peripheral vascular catheterization Left 11/11/2015    Procedure: Thrombectomy;  Surgeon: Katha Cabal, MD;  Location: [facility];  Service: Cardiovascular;  Laterality: Left;    FAMILY HISTORY Family History  Problem Relation Age of Onset  . Diabetes Father   . Hypertension Father        ADVANCED DIRECTIVES:    HEALTH MAINTENANCE: Social History  Substance Use Topics  . Smoking status: Former Smoker -- 2.00 packs/day for 50 years    Types: Cigarettes    Quit date: 08/07/2013  . Smokeless tobacco: Never Used  . Alcohol Use: 0.0 oz/week    0 Standard drinks or equivalent per week     Comment: rarely     Colonoscopy:  PAP:  Bone density:  Lipid panel:  Allergies  Allergen Reactions  . Aleve [Naproxen Sodium] Hives, Shortness Of Breath and Swelling    Naproxin    Current Outpatient Prescriptions  Medication Sig  Dispense Refill  . albuterol (PROAIR HFA) 108 (90 Base) MCG/ACT inhaler Inhale 1-2 puffs into the lungs every 4 (four) hours as needed. 1 Inhaler 0  . apixaban (ELIQUIS) 5 MG TABS tablet Take 1 tablet (5 mg total) by mouth 2 (two) times daily. 60 tablet 6  . benzonatate (TESSALON) 200 MG capsule Take 200 mg by mouth 3 (three) times daily as needed for cough.    Marland Kitchen HYDROcodone-acetaminophen (NORCO/VICODIN) 5-325 MG tablet Take 1 tablet by mouth every 4 (four) hours as needed for  moderate pain. 60 tablet 0  . lidocaine-prilocaine (EMLA) cream Apply to affected area once 30 g 3  . Multiple Vitamin (MULTI-VITAMINS) TABS Take 1 tablet by mouth daily.     . ondansetron (ZOFRAN) 8 MG tablet Take 1 tablet (8 mg total) by mouth 2 (two) times daily as needed for refractory nausea / vomiting. Start on day 3 after chemo. 30 tablet 1  . prochlorperazine (COMPAZINE) 10 MG tablet Take 1 tablet (10 mg total) by mouth every 6 (six) hours as needed (Nausea or vomiting). 30 tablet 1  . Oxycodone HCl 10 MG TABS Take 1 tablet (10 mg total) by mouth every 6 (six) hours as needed. 60 tablet 0   No current facility-administered medications for this visit.   Facility-Administered Medications Ordered in Other Visits  Medication Dose Route Frequency Provider Last Rate Last Dose  . heparin lock flush 100 unit/mL  500 Units Intravenous Once Lloyd Huger, MD      . sodium chloride flush (NS) 0.9 % injection 10 mL  10 mL Intravenous PRN Lloyd Huger, MD        OBJECTIVE: Filed Vitals:   11/18/15 0901  BP: 126/82  Pulse: 112  Temp: 95 F (35 C)  Resp: 18     Body mass index is 19.43 kg/(m^2).    ECOG FS:1 - Symptomatic but completely ambulatory  General: Well-developed, well-nourished,  mild respiratory  distress. Eyes: Pink conjunctiva, anicteric sclera. Lungs: Scattered wheezing throughout, improved. Heart: Regular rate and rhythm. No rubs, murmurs, or gallops. Abdomen: Soft, nontender, nondistended. No organomegaly noted, normoactive bowel sounds. Musculoskeletal: Left lower extremity edema. Neuro: Alert, answering all questions appropriately. Cranial nerves grossly intact. Skin: No rashes or petechiae noted. Psych: Normal affect.   LAB RESULTS:  Lab Results  Component Value Date   NA 132* 11/18/2015   K 4.2 11/18/2015   CL 99* 11/18/2015   CO2 25 11/18/2015   GLUCOSE 128* 11/18/2015   BUN 12 11/18/2015   CREATININE 0.71 11/18/2015   CALCIUM 8.8* 11/18/2015    PROT 7.9 11/18/2015   ALBUMIN 2.7* 11/18/2015   AST 15 11/18/2015   ALT 12* 11/18/2015   ALKPHOS 191* 11/18/2015   BILITOT 0.4 11/18/2015   GFRNONAA >60 11/18/2015   GFRAA >60 11/18/2015    Lab Results  Component Value Date   WBC 12.5* 11/18/2015   NEUTROABS 10.3* 11/18/2015   HGB 10.2* 11/18/2015   HCT 30.9* 11/18/2015   MCV 72.7* 11/18/2015   PLT 372 11/18/2015     STUDIES: X-ray Chest Pa Or Ap  10/29/2015  CLINICAL DATA:  Port-A-Cath placement.  Lung mass. EXAM: CHEST 1 VIEW COMPARISON:  CT chest 09/22/2015.  Chest x-ray 09/07/2015 FINDINGS: Left subclavian Port-A-Cath tip in the SVC.  No pneumothorax. Extensive right upper lobe mass has progressed in the interval. There may be adjacent infiltrate or collapse. Lower lobes not included on the study. Underlying COPD. IMPRESSION: Satisfactory Port-A-Cath placement. Progression of right upper  lobe mass and atelectasis/infiltrate. Electronically Signed   By: Franchot Gallo M.D.   On: 10/29/2015 09:37   Dg Chest 2 View  11/04/2015  CLINICAL DATA:  Malignant neoplasm RIGHT upper lobe, increasing shortness of breath for 3 days, CHF, atrial fibrillation, COPD, hypertension EXAM: CHEST  2 VIEW COMPARISON:  10/29/2015 FINDINGS: LEFT subclavian Port-A-Cath with tip projecting over SVC. Stable heart size and pulmonary vascularity. Atherosclerotic calcification aorta. Chronic opacity RIGHT upper lobe corresponding to known neoplasm. RIGHT hilar enlargement due to mass/adenopathy again seen. Underlying COPD changes. Scarring in LEFT upper lobe. No definite acute infiltrate, pleural effusion or pneumothorax. IMPRESSION: RIGHT upper lobe malignancy with RIGHT hilar adenopathy/mass. COPD changes with LEFT upper lobe scarring. No acute abnormalities. Electronically Signed   By: Lavonia Dana M.D.   On: 11/04/2015 10:56   US Venous Img Lower Unilateral Left  11/07/2015  CLINICAL DATA:  Left leg swelling since yesterday. EXAM: Left LOWER EXTREMITY VENOUS  DOPPLER ULTRASOUND TECHNIQUE: Gray-scale sonography with graded compression, as well as color Doppler and duplex ultrasound were performed to evaluate the lower extremity deep venous systems from the level of the common femoral vein and including the common femoral, femoral, profunda femoral, popliteal and calf veins including the posterior tibial, peroneal and gastrocnemius veins when visible. The superficial great saphenous vein was also interrogated. Spectral Doppler was utilized to evaluate flow at rest and with distal augmentation maneuvers in the common femoral, femoral and popliteal veins. COMPARISON:  None. FINDINGS: Contralateral Common Femoral Vein: Nonocclusive thrombus. Common Femoral Vein: Nonocclusive thrombus. Saphenofemoral Junction: Nonocclusive thrombus Profunda Femoral Vein: Occlusive thrombus. Femoral Vein: Occlusive thrombus Popliteal Vein: Occlusive thrombus Calf Veins: Occlusive thrombus Superficial Great Saphenous Vein: Nonocclusive thrombus. IMPRESSION: Study is positive for deep venous thrombosis bilaterally. Nonocclusive thrombus is identified in the left common femoral vein with occlusive thrombus extending down the left femoral, profunda, popliteal, and calf veins. Nonocclusive thrombus identified in the contralateral (right) common femoral vein. Critical Value/emergent results were called by me at the time of interpretation on 11/07/2015 at 9:07 pm to Dr. Marcelene Butte, who verbally acknowledged these results. Electronically Signed   By: Misty Stanley M.D.   On: 11/07/2015 21:08   Dg C-arm 1-60 Min  10/29/2015  CLINICAL DATA:  Port-A-Cath placement. EXAM: DG C-ARM 61-120 MIN COMPARISON:  CT 09/22/2015 . FINDINGS: Port-A-Cath tip noted projected over the lower portion superior vena cava. Cine images obtained. 1 minutes 22 seconds fluoroscopy time. IMPRESSION: Port-A-Cath tip noted projected over the lower portion of the superior vena cava. 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  PLAN:    1. Stage III B poorly differentiated carcinoma of the right upper lobe lung: CT and PET results reviewed independently. MRI of brain negative for metastatic disease.  Pathology consistent with poorly differentiated carcinoma, but there is not enough tissue for additional molecular testing. Proceed with cycle 3 weekly carboplatinum and Taxol today. Continue daily XRT completing on December 02, 2015. After that, patient will have a 1-2 weeks break and then receive a 10 day boost of XRT. At the conclusion of his XRT, patient will likely benefit from 2 consolidation doses of chemotherapy. Return to clinic in 1 week for consideration of cycle 4. 2. Leukocytosis: Likely reactive, monitor. 3. Bilateral DVT: Patient had thrombectomy last week. Continue Eliquis as prescribed. Patient will require anticoagulation for 6 months given the extent of  his blood clot. 4. Pain: Patient was given a prescription for oxycodone today. 5. Shortness of breath: Improved. Previously, chest x-ray as above with no obvious infiltrate. Continue inhalers as prescribed. Continue home oxygen as needed. He will likely need a pulmonary consult in the near future.   Patient expressed understanding and was in agreement with this plan. He also understands that He can call clinic at any time with any questions, concerns, or complaints.   Lung cancer [facility])   Staging form: Lung, AJCC 7th Edition     Clinical stage from 10/17/2015: Stage IIIB (T4, N2, M0) - Signed by Lloyd Huger, MD on 10/17/2015   Lloyd Huger, MD   11/18/2015 9:23 AM

## 2015-11-18 NOTE — Progress Notes (Signed)
States continues to have left leg pain due to recent blood clot. Swelling has improved. Would like to discuss changing medication for pain since current medication only helps for about 2 hours at a time. Needs refill of pain medication and proair inhaler.

## 2015-11-19 ENCOUNTER — Ambulatory Visit
Admission: RE | Admit: 2015-11-19 | Discharge: 2015-11-19 | Disposition: A | Discharge status: Home / Self Care | Payer: Medicaid Other | Source: Ambulatory Visit | Attending: Radiation Oncology | Admitting: Radiation Oncology

## 2015-11-19 ENCOUNTER — Ambulatory Visit: Payer: Medicaid Other

## 2015-11-19 DIAGNOSIS — Z510 Encounter for antineoplastic radiation therapy: Secondary | ICD-10-CM | POA: Diagnosis not present

## 2015-11-20 ENCOUNTER — Ambulatory Visit
Admission: RE | Admit: 2015-11-20 | Discharge: 2015-11-20 | Disposition: A | Discharge status: Home / Self Care | Payer: Medicaid Other | Source: Ambulatory Visit | Attending: Radiation Oncology | Admitting: Radiation Oncology

## 2015-11-20 DIAGNOSIS — Z510 Encounter for antineoplastic radiation therapy: Secondary | ICD-10-CM | POA: Diagnosis not present

## 2015-11-23 ENCOUNTER — Ambulatory Visit
Admission: RE | Admit: 2015-11-23 | Discharge: 2015-11-23 | Disposition: A | Discharge status: Home / Self Care | Payer: Medicaid Other | Source: Ambulatory Visit | Attending: Radiation Oncology | Admitting: Radiation Oncology

## 2015-11-23 DIAGNOSIS — Z510 Encounter for antineoplastic radiation therapy: Secondary | ICD-10-CM | POA: Diagnosis not present

## 2015-11-24 ENCOUNTER — Ambulatory Visit
Admission: RE | Admit: 2015-11-24 | Discharge: 2015-11-24 | Disposition: A | Discharge status: Home / Self Care | Payer: Medicaid Other | Source: Ambulatory Visit | Attending: Radiation Oncology | Admitting: Radiation Oncology

## 2015-11-24 ENCOUNTER — Other Ambulatory Visit: Payer: Self-pay | Admitting: *Deleted

## 2015-11-24 ENCOUNTER — Ambulatory Visit: Payer: Medicaid Other

## 2015-11-24 DIAGNOSIS — Z510 Encounter for antineoplastic radiation therapy: Secondary | ICD-10-CM | POA: Diagnosis not present

## 2015-11-24 MED ORDER — SUCRALFATE 1 G PO TABS: 1.0000 g | ORAL_TABLET | Freq: Three times a day (TID) | ORAL | Status: DC

## 2015-11-25 ENCOUNTER — Inpatient Hospital Stay (HOSPITAL_BASED_OUTPATIENT_CLINIC_OR_DEPARTMENT_OTHER): Payer: Medicaid Other | Admitting: Oncology

## 2015-11-25 ENCOUNTER — Inpatient Hospital Stay: Payer: Medicaid Other

## 2015-11-25 ENCOUNTER — Ambulatory Visit
Admission: RE | Admit: 2015-11-25 | Discharge: 2015-11-25 | Disposition: A | Discharge status: Home / Self Care | Payer: Medicaid Other | Source: Ambulatory Visit | Attending: Radiation Oncology | Admitting: Radiation Oncology

## 2015-11-25 VITALS — BP 132/76 | HR 90

## 2015-11-25 VITALS — BP 122/69 | HR 88 | Temp 95.3°F | Resp 19 | Ht 75.0 in | Wt 155.5 lb

## 2015-11-25 DIAGNOSIS — R5383 Other fatigue: Secondary | ICD-10-CM

## 2015-11-25 DIAGNOSIS — D72829 Elevated white blood cell count, unspecified: Secondary | ICD-10-CM

## 2015-11-25 DIAGNOSIS — I509 Heart failure, unspecified: Secondary | ICD-10-CM

## 2015-11-25 DIAGNOSIS — I4891 Unspecified atrial fibrillation: Secondary | ICD-10-CM

## 2015-11-25 DIAGNOSIS — I129 Hypertensive chronic kidney disease with stage 1 through stage 4 chronic kidney disease, or unspecified chronic kidney disease: Secondary | ICD-10-CM

## 2015-11-25 DIAGNOSIS — Z7901 Long term (current) use of anticoagulants: Secondary | ICD-10-CM

## 2015-11-25 DIAGNOSIS — I82412 Acute embolism and thrombosis of left femoral vein: Secondary | ICD-10-CM

## 2015-11-25 DIAGNOSIS — K769 Liver disease, unspecified: Secondary | ICD-10-CM

## 2015-11-25 DIAGNOSIS — C3411 Malignant neoplasm of upper lobe, right bronchus or lung: Secondary | ICD-10-CM

## 2015-11-25 DIAGNOSIS — M549 Dorsalgia, unspecified: Secondary | ICD-10-CM

## 2015-11-25 DIAGNOSIS — Z5111 Encounter for antineoplastic chemotherapy: Secondary | ICD-10-CM | POA: Diagnosis not present

## 2015-11-25 DIAGNOSIS — M129 Arthropathy, unspecified: Secondary | ICD-10-CM

## 2015-11-25 DIAGNOSIS — Z923 Personal history of irradiation: Secondary | ICD-10-CM

## 2015-11-25 DIAGNOSIS — Z8701 Personal history of pneumonia (recurrent): Secondary | ICD-10-CM

## 2015-11-25 DIAGNOSIS — R634 Abnormal weight loss: Secondary | ICD-10-CM

## 2015-11-25 DIAGNOSIS — R0602 Shortness of breath: Secondary | ICD-10-CM

## 2015-11-25 DIAGNOSIS — Z9221 Personal history of antineoplastic chemotherapy: Secondary | ICD-10-CM

## 2015-11-25 DIAGNOSIS — R531 Weakness: Secondary | ICD-10-CM

## 2015-11-25 DIAGNOSIS — Z79899 Other long term (current) drug therapy: Secondary | ICD-10-CM

## 2015-11-25 DIAGNOSIS — Z510 Encounter for antineoplastic radiation therapy: Secondary | ICD-10-CM | POA: Diagnosis not present

## 2015-11-25 DIAGNOSIS — J449 Chronic obstructive pulmonary disease, unspecified: Secondary | ICD-10-CM

## 2015-11-25 DIAGNOSIS — I2510 Atherosclerotic heart disease of native coronary artery without angina pectoris: Secondary | ICD-10-CM

## 2015-11-25 DIAGNOSIS — K4020 Bilateral inguinal hernia, without obstruction or gangrene, not specified as recurrent: Secondary | ICD-10-CM

## 2015-11-25 DIAGNOSIS — Z87891 Personal history of nicotine dependence: Secondary | ICD-10-CM

## 2015-11-25 DIAGNOSIS — R590 Localized enlarged lymph nodes: Secondary | ICD-10-CM

## 2015-11-25 DIAGNOSIS — N189 Chronic kidney disease, unspecified: Secondary | ICD-10-CM

## 2015-11-25 LAB — CBC WITH DIFFERENTIAL/PLATELET
Basophils Absolute: 0.1 10*3/uL (ref 0–0.1)
Basophils Relative: 1 %
Eosinophils Absolute: 0.1 10*3/uL (ref 0–0.7)
Eosinophils Relative: 2 %
HEMATOCRIT: 29.4 % — AB (ref 40.0–52.0)
Hemoglobin: 9.8 g/dL — ABNORMAL LOW (ref 13.0–18.0)
LYMPHS PCT: 8 %
Lymphs Abs: 0.6 10*3/uL — ABNORMAL LOW (ref 1.0–3.6)
MCH: 24.6 pg — ABNORMAL LOW (ref 26.0–34.0)
MCHC: 33.5 g/dL (ref 32.0–36.0)
MCV: 73.4 fL — AB (ref 80.0–100.0)
MONO ABS: 0.7 10*3/uL (ref 0.2–1.0)
MONOS PCT: 9 %
NEUTROS ABS: 6.4 10*3/uL (ref 1.4–6.5)
Neutrophils Relative %: 80 %
Platelets: 339 10*3/uL (ref 150–440)
RBC: 4 MIL/uL — ABNORMAL LOW (ref 4.40–5.90)
RDW: 20.1 % — AB (ref 11.5–14.5)
WBC: 7.9 10*3/uL (ref 3.8–10.6)

## 2015-11-25 LAB — COMPREHENSIVE METABOLIC PANEL
ALT: 10 U/L — ABNORMAL LOW (ref 17–63)
ANION GAP: 8 (ref 5–15)
AST: 14 U/L — ABNORMAL LOW (ref 15–41)
Albumin: 2.9 g/dL — ABNORMAL LOW (ref 3.5–5.0)
Alkaline Phosphatase: 159 U/L — ABNORMAL HIGH (ref 38–126)
BILIRUBIN TOTAL: 0.4 mg/dL (ref 0.3–1.2)
BUN: 12 mg/dL (ref 6–20)
CO2: 27 mmol/L (ref 22–32)
Calcium: 8.5 mg/dL — ABNORMAL LOW (ref 8.9–10.3)
Chloride: 99 mmol/L — ABNORMAL LOW (ref 101–111)
Creatinine, Ser: 0.7 mg/dL (ref 0.61–1.24)
Glucose, Bld: 163 mg/dL — ABNORMAL HIGH (ref 65–99)
POTASSIUM: 3.9 mmol/L (ref 3.5–5.1)
Sodium: 134 mmol/L — ABNORMAL LOW (ref 135–145)
TOTAL PROTEIN: 7.6 g/dL (ref 6.5–8.1)

## 2015-11-25 MED ORDER — SODIUM CHLORIDE 0.9 % IV SOLN
10.0000 mg | Freq: Once | INTRAVENOUS | Status: AC
  Administered 2015-11-25: 10 mg via INTRAVENOUS
  Filled 2015-11-25: qty 1

## 2015-11-25 MED ORDER — SODIUM CHLORIDE 0.9 % IV SOLN
Freq: Once | INTRAVENOUS | Status: AC
  Administered 2015-11-25: 09:00:00 via INTRAVENOUS
  Filled 2015-11-25: qty 1000

## 2015-11-25 MED ORDER — HEPARIN SOD (PORK) LOCK FLUSH 100 UNIT/ML IV SOLN
500.0000 [IU] | Freq: Once | INTRAVENOUS | Status: DC | PRN
  Filled 2015-11-25 (×2): qty 5

## 2015-11-25 MED ORDER — HEPARIN SOD (PORK) LOCK FLUSH 100 UNIT/ML IV SOLN
500.0000 [IU] | Freq: Once | INTRAVENOUS | Status: AC
  Administered 2015-11-25: 500 [IU] via INTRAVENOUS

## 2015-11-25 MED ORDER — PALONOSETRON HCL INJECTION 0.25 MG/5ML
0.2500 mg | Freq: Once | INTRAVENOUS | Status: AC
  Administered 2015-11-25: 0.25 mg via INTRAVENOUS
  Filled 2015-11-25: qty 5

## 2015-11-25 MED ORDER — SODIUM CHLORIDE 0.9% FLUSH
10.0000 mL | INTRAVENOUS | Status: DC | PRN
  Filled 2015-11-25: qty 10

## 2015-11-25 MED ORDER — SODIUM CHLORIDE 0.9 % IV SOLN
250.8000 mg | Freq: Once | INTRAVENOUS | Status: AC
  Administered 2015-11-25: 250 mg via INTRAVENOUS
  Filled 2015-11-25: qty 25

## 2015-11-25 MED ORDER — FAMOTIDINE IN NACL 20-0.9 MG/50ML-% IV SOLN
20.0000 mg | Freq: Once | INTRAVENOUS | Status: AC
  Administered 2015-11-25: 20 mg via INTRAVENOUS
  Filled 2015-11-25: qty 50

## 2015-11-25 MED ORDER — DIPHENHYDRAMINE HCL 50 MG/ML IJ SOLN
25.0000 mg | Freq: Once | INTRAMUSCULAR | Status: AC
  Administered 2015-11-25: 25 mg via INTRAVENOUS
  Filled 2015-11-25: qty 1

## 2015-11-25 MED ORDER — PACLITAXEL CHEMO INJECTION 300 MG/50ML
45.0000 mg/m2 | Freq: Once | INTRAVENOUS | Status: AC
  Administered 2015-11-25: 90 mg via INTRAVENOUS
  Filled 2015-11-25: qty 15

## 2015-11-25 MED ORDER — SODIUM CHLORIDE 0.9% FLUSH
10.0000 mL | Freq: Once | INTRAVENOUS | Status: AC
  Administered 2015-11-25: 10 mL via INTRAVENOUS
  Filled 2015-11-25: qty 10

## 2015-11-25 NOTE — Progress Notes (Signed)
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  INTERVAL HISTORY: Patient returns to clinic today for further evaluation and consideration of cycle 4 of weekly carboplatinum and Taxol. He continues to have pain in his left leg from thrombectomy. His breathing is much improved and now only requires oxygen intermittently. He continues to feel weak and fatigued, but this is improved as well.  He has no neurologic complaints. He denies any fevers. He denies any chest pain or hemoptysis. He has a chronic cough that is unchanged.  He is eating and drinking well. He denies any nausea, vomiting, consultation, or diarrhea. He has no urinary complaints. Patient offers no further specific complaints.  REVIEW OF SYSTEMS:   Review of Systems  Constitutional: Positive for malaise/fatigue. Negative for fever.  Respiratory: Positive for cough. Negative for hemoptysis and shortness of breath.   Cardiovascular: Negative.  Negative for chest pain.  Gastrointestinal: Negative.  Negative for abdominal pain.  Genitourinary: Negative.   Musculoskeletal: Positive for back pain.       Left lower leg pain secondary to DVT  Neurological: Positive for weakness.  Psychiatric/Behavioral: The patient is not nervous/anxious and does not have insomnia.     As per HPI. Otherwise, a complete review of systems is negatve.  PAST MEDICAL HISTORY: Past Medical History  Diagnosis Date  . Atrial fibrillation ([place])   . CHF (congestive heart failure) ([place])   . Hypertension   . Disease of lung   . COPD (chronic obstructive pulmonary disease) ([place])   . Pneumonia   . Chronic kidney disease     . Cancer ([place]) 2017    Lung  . Dysrhythmia   . Headache   . Arthritis   . History of chemotherapy   . History of radiation therapy   . DVT (deep venous thrombosis) ([facility])     left leg    PAST SURGICAL HISTORY: Past Surgical History  Procedure Laterality Date  . Knee surgery Bilateral   . Tonsillectomy    . Portacath placement N/A 10/29/2015    Procedure: INSERTION PORT-A-CATH;  Surgeon: Nestor Lewandowsky, MD;  Location: [facility]S;  Service: General;  Laterality: N/A;  . Peripheral vascular catheterization Left 11/11/2015    Procedure: Thrombectomy;  Surgeon: Katha Cabal, MD;  Location: [facility];  Service: Cardiovascular;  Laterality: Left;    FAMILY HISTORY Family History  Problem Relation Age of Onset  . Diabetes Father   . Hypertension Father        ADVANCED DIRECTIVES:    HEALTH MAINTENANCE: Social History  Substance Use Topics  . Smoking status: Former Smoker -- 2.00 packs/day for 50 years    Types: Cigarettes    Quit date: 08/07/2013  . Smokeless tobacco: Never Used  . Alcohol Use: 0.0 oz/week    0 Standard drinks or equivalent per week     Comment: rarely     Allergies  Allergen Reactions  . Aleve [Naproxen Sodium] Hives, Shortness Of Breath and Swelling    Naproxin    Current Outpatient Prescriptions  Medication Sig Dispense Refill  . albuterol (PROAIR HFA) 108 (90 Base) MCG/ACT inhaler Inhale 1-2 puffs into the lungs every 4 (four) hours as needed. 1 Inhaler 3  .  apixaban (ELIQUIS) 5 MG TABS tablet Take 1 tablet (5 mg total) by mouth 2 (two) times daily. 60 tablet 6  . lidocaine-prilocaine (EMLA) cream Apply to affected area once 30 g 3  . Multiple Vitamin (MULTI-VITAMINS) TABS Take 1 tablet by mouth daily.     . ondansetron (ZOFRAN) 8 MG tablet Take 1 tablet (8 mg total) by mouth 2 (two) times daily as needed for refractory nausea / vomiting. Start on day 3 after chemo. 30 tablet 1  . prochlorperazine (COMPAZINE) 10 MG tablet Take 1 tablet  (10 mg total) by mouth every 6 (six) hours as needed (Nausea or vomiting). 30 tablet 1  . sucralfate (CARAFATE) 1 g tablet Take 1 tablet (1 g total) by mouth 3 (three) times daily. Dissolve in 2-3 tbsp warm water; swish and swallow. 90 tablet 3  . benzonatate (TESSALON) 200 MG capsule Take 200 mg by mouth 3 (three) times daily as needed for cough. Reported on 11/25/2015    . HYDROcodone-acetaminophen (NORCO/VICODIN) 5-325 MG tablet Take 1 tablet by mouth every 4 (four) hours as needed for moderate pain. (Patient not taking: Reported on 11/25/2015) 60 tablet 0  . Oxycodone HCl 10 MG TABS Take 1 tablet (10 mg total) by mouth every 6 (six) hours as needed. (Patient not taking: Reported on 11/25/2015) 60 tablet 0   No current facility-administered medications for this visit.   Facility-Administered Medications Ordered in Other Visits  Medication Dose Route Frequency Provider Last Rate Last Dose  . heparin lock flush 100 unit/mL  500 Units Intravenous Once Lloyd Huger, MD      . heparin lock flush 100 unit/mL  500 Units Intravenous Once Lloyd Huger, MD      . sodium chloride flush (NS) 0.9 % injection 10 mL  10 mL Intravenous PRN Lloyd Huger, MD        OBJECTIVE: Filed Vitals:   11/25/15 0901  BP: 122/69  Pulse: 88  Temp: 95.3 F (35.2 C)  Resp: 19     Body mass index is 19.44 kg/(m^2).    ECOG FS:1 - Symptomatic but completely ambulatory  General: Well-developed, well-nourished,  mild respiratory  distress. Eyes: Pink conjunctiva, anicteric sclera. Lungs: Scattered wheezing throughout, improved. Heart: Regular rate and rhythm. No rubs, murmurs, or gallops. Abdomen: Soft, nontender, nondistended. No organomegaly noted, normoactive bowel sounds. Musculoskeletal: Left lower extremity edema. Neuro: Alert, answering all questions appropriately. Cranial nerves grossly intact. Skin: No rashes or petechiae noted. Psych: Normal affect.   LAB RESULTS:  Lab Results    Component Value Date   NA 132* 11/18/2015   K 4.2 11/18/2015   CL 99* 11/18/2015   CO2 25 11/18/2015   GLUCOSE 128* 11/18/2015   BUN 12 11/18/2015   CREATININE 0.71 11/18/2015   CALCIUM 8.8* 11/18/2015   PROT 7.9 11/18/2015   ALBUMIN 2.7* 11/18/2015   AST 15 11/18/2015   ALT 12* 11/18/2015   ALKPHOS 191* 11/18/2015   BILITOT 0.4 11/18/2015   GFRNONAA >60 11/18/2015   GFRAA >60 11/18/2015    Lab Results  Component Value Date   WBC 7.9 11/25/2015   NEUTROABS 6.4 11/25/2015   HGB 9.8* 11/25/2015   HCT 29.4* 11/25/2015   MCV 73.4* 11/25/2015   PLT 339 11/25/2015     STUDIES: X-ray Chest Pa Or Ap  10/29/2015  CLINICAL DATA:  Port-A-Cath placement.  Lung mass. EXAM: CHEST 1 VIEW COMPARISON:  CT chest 09/22/2015.  Chest x-ray 09/07/2015 FINDINGS: Left subclavian Port-A-Cath tip in the  SVC.  No pneumothorax. Extensive right upper lobe mass has progressed in the interval. There may be adjacent infiltrate or collapse. Lower lobes not included on the study. Underlying COPD. IMPRESSION: Satisfactory Port-A-Cath placement. Progression of right upper lobe mass and atelectasis/infiltrate. Electronically Signed   By: Franchot Gallo M.D.   On: 10/29/2015 09:37   Dg Chest 2 View  11/04/2015  CLINICAL DATA:  Malignant neoplasm RIGHT upper lobe, increasing shortness of breath for 3 days, CHF, atrial fibrillation, COPD, hypertension EXAM: CHEST  2 VIEW COMPARISON:  10/29/2015 FINDINGS: LEFT subclavian Port-A-Cath with tip projecting over SVC. Stable heart size and pulmonary vascularity. Atherosclerotic calcification aorta. Chronic opacity RIGHT upper lobe corresponding to known neoplasm. RIGHT hilar enlargement due to mass/adenopathy again seen. Underlying COPD changes. Scarring in LEFT upper lobe. No definite acute infiltrate, pleural effusion or pneumothorax. IMPRESSION: RIGHT upper lobe malignancy with RIGHT hilar adenopathy/mass. COPD changes with LEFT upper lobe scarring. No acute abnormalities.  Electronically Signed   By: Lavonia Dana M.D.   On: 11/04/2015 10:56   US Venous Img Lower Unilateral Left  11/07/2015  CLINICAL DATA:  Left leg swelling since yesterday. EXAM: Left LOWER EXTREMITY VENOUS DOPPLER ULTRASOUND TECHNIQUE: Gray-scale sonography with graded compression, as well as color Doppler and duplex ultrasound were performed to evaluate the lower extremity deep venous systems from the level of the common femoral vein and including the common femoral, femoral, profunda femoral, popliteal and calf veins including the posterior tibial, peroneal and gastrocnemius veins when visible. The superficial great saphenous vein was also interrogated. Spectral Doppler was utilized to evaluate flow at rest and with distal augmentation maneuvers in the common femoral, femoral and popliteal veins. COMPARISON:  None. FINDINGS: Contralateral Common Femoral Vein: Nonocclusive thrombus. Common Femoral Vein: Nonocclusive thrombus. Saphenofemoral Junction: Nonocclusive thrombus Profunda Femoral Vein: Occlusive thrombus. Femoral Vein: Occlusive thrombus Popliteal Vein: Occlusive thrombus Calf Veins: Occlusive thrombus Superficial Great Saphenous Vein: Nonocclusive thrombus. IMPRESSION: Study is positive for deep venous thrombosis bilaterally. Nonocclusive thrombus is identified in the left common femoral vein with occlusive thrombus extending down the left femoral, profunda, popliteal, and calf veins. Nonocclusive thrombus identified in the contralateral (right) common femoral vein. Critical Value/emergent results were called by me at the time of interpretation on 11/07/2015 at 9:07 pm to Dr. Marcelene Butte, who verbally acknowledged these results. Electronically Signed   By: Misty Stanley M.D.   On: 11/07/2015 21:08   Dg C-arm 1-60 Min  10/29/2015  CLINICAL DATA:  Port-A-Cath placement. EXAM: DG C-ARM 61-120 MIN COMPARISON:  CT 09/22/2015 . FINDINGS: Port-A-Cath tip noted projected over the lower portion superior vena cava.  Cine images obtained. 1 minutes 22 seconds fluoroscopy time. IMPRESSION: Port-A-Cath tip noted projected over the lower portion of the superior vena cava. 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  PLAN:    1. Stage III B poorly differentiated carcinoma of the right upper lobe lung:  MRI of brain negative for metastatic disease.  Pathology consistent with poorly differentiated carcinoma, but there is not enough tissue for additional molecular testing. Proceed with cycle 4 weekly carboplatinum and Taxol today. Continue daily XRT completing on December 02, 2015. After that, patient will have a 1-2 weeks break and then receive a 10 day boost of XRT. At the conclusion of his XRT, patient will likely benefit from 2 consolidation doses of chemotherapy. Return to clinic in 1 week  for consideration of cycle 5. 2. Leukocytosis: Resolved. 3. Bilateral DVT: Patient had thrombectomy. Continue Eliquis as prescribed. Patient will require anticoagulation for 6 months given the extent of his blood clot. 4. Pain: Continue oxycodone PRN. 5. Shortness of breath: Improved. Previously, chest x-ray as above with no obvious infiltrate. Continue inhalers as prescribed. Continue home oxygen as needed. He will likely need a pulmonary consult in the near future.   Patient expressed understanding and was in agreement with this plan. He also understands that He can call clinic at any time with any questions, concerns, or complaints.   Lung cancer [facility])   Staging form: Lung, AJCC 7th Edition     Clinical stage from 10/17/2015: Stage IIIB (T4, N2, M0) - Signed by Lloyd Huger, MD on 10/17/2015  Dr. Grayland Ormond was available for consultation and review of plan of care for this patient.  Mayra Reel, NP   11/25/2015 9:21 AM

## 2015-11-25 NOTE — Progress Notes (Signed)
Pt reports he still coughs a moderate amount of mostly clear thick mucous and sob on exertion,  Denies any fevers.  No other concerns today.

## 2015-11-26 ENCOUNTER — Ambulatory Visit
Admission: RE | Admit: 2015-11-26 | Discharge: 2015-11-26 | Disposition: A | Discharge status: Home / Self Care | Payer: Medicaid Other | Source: Ambulatory Visit | Attending: Radiation Oncology | Admitting: Radiation Oncology

## 2015-11-26 DIAGNOSIS — Z510 Encounter for antineoplastic radiation therapy: Secondary | ICD-10-CM | POA: Diagnosis not present

## 2015-11-27 ENCOUNTER — Ambulatory Visit
Admission: RE | Admit: 2015-11-27 | Discharge: 2015-11-27 | Disposition: A | Discharge status: Home / Self Care | Payer: Medicaid Other | Source: Ambulatory Visit | Attending: Radiation Oncology | Admitting: Radiation Oncology

## 2015-11-27 DIAGNOSIS — Z510 Encounter for antineoplastic radiation therapy: Secondary | ICD-10-CM | POA: Diagnosis not present

## 2015-11-30 ENCOUNTER — Ambulatory Visit
Admission: RE | Admit: 2015-11-30 | Discharge: 2015-11-30 | Disposition: A | Discharge status: Home / Self Care | Payer: Medicaid Other | Source: Ambulatory Visit | Attending: Radiation Oncology | Admitting: Radiation Oncology

## 2015-11-30 ENCOUNTER — Other Ambulatory Visit: Payer: Self-pay | Admitting: *Deleted

## 2015-11-30 DIAGNOSIS — Z510 Encounter for antineoplastic radiation therapy: Secondary | ICD-10-CM | POA: Diagnosis not present

## 2015-11-30 MED ORDER — APIXABAN 5 MG PO TABS: 5.0000 mg | ORAL_TABLET | Freq: Two times a day (BID) | ORAL | 3 refills | Status: AC

## 2015-12-01 ENCOUNTER — Ambulatory Visit
Admission: RE | Admit: 2015-12-01 | Discharge: 2015-12-01 | Disposition: A | Discharge status: Home / Self Care | Payer: Medicaid Other | Source: Ambulatory Visit | Attending: Radiation Oncology | Admitting: Radiation Oncology

## 2015-12-01 DIAGNOSIS — Z510 Encounter for antineoplastic radiation therapy: Secondary | ICD-10-CM | POA: Diagnosis not present

## 2015-12-02 ENCOUNTER — Inpatient Hospital Stay: Payer: Medicaid Other

## 2015-12-02 ENCOUNTER — Inpatient Hospital Stay (HOSPITAL_BASED_OUTPATIENT_CLINIC_OR_DEPARTMENT_OTHER): Payer: Medicaid Other | Admitting: Oncology

## 2015-12-02 ENCOUNTER — Ambulatory Visit
Admission: RE | Admit: 2015-12-02 | Discharge: 2015-12-02 | Disposition: A | Discharge status: Home / Self Care | Payer: Medicaid Other | Source: Ambulatory Visit | Attending: Radiation Oncology | Admitting: Radiation Oncology

## 2015-12-02 VITALS — BP 136/81 | HR 86 | Temp 96.0°F | Resp 18 | Wt 157.7 lb

## 2015-12-02 VITALS — BP 134/68 | HR 88 | Resp 20

## 2015-12-02 DIAGNOSIS — R5383 Other fatigue: Secondary | ICD-10-CM

## 2015-12-02 DIAGNOSIS — I4891 Unspecified atrial fibrillation: Secondary | ICD-10-CM

## 2015-12-02 DIAGNOSIS — C801 Malignant (primary) neoplasm, unspecified: Secondary | ICD-10-CM

## 2015-12-02 DIAGNOSIS — I509 Heart failure, unspecified: Secondary | ICD-10-CM

## 2015-12-02 DIAGNOSIS — Z87891 Personal history of nicotine dependence: Secondary | ICD-10-CM

## 2015-12-02 DIAGNOSIS — D72829 Elevated white blood cell count, unspecified: Secondary | ICD-10-CM

## 2015-12-02 DIAGNOSIS — Z923 Personal history of irradiation: Secondary | ICD-10-CM

## 2015-12-02 DIAGNOSIS — N189 Chronic kidney disease, unspecified: Secondary | ICD-10-CM

## 2015-12-02 DIAGNOSIS — Z510 Encounter for antineoplastic radiation therapy: Secondary | ICD-10-CM | POA: Diagnosis not present

## 2015-12-02 DIAGNOSIS — R0602 Shortness of breath: Secondary | ICD-10-CM

## 2015-12-02 DIAGNOSIS — Z9221 Personal history of antineoplastic chemotherapy: Secondary | ICD-10-CM

## 2015-12-02 DIAGNOSIS — Z7901 Long term (current) use of anticoagulants: Secondary | ICD-10-CM

## 2015-12-02 DIAGNOSIS — C3411 Malignant neoplasm of upper lobe, right bronchus or lung: Secondary | ICD-10-CM

## 2015-12-02 DIAGNOSIS — R590 Localized enlarged lymph nodes: Secondary | ICD-10-CM

## 2015-12-02 DIAGNOSIS — R634 Abnormal weight loss: Secondary | ICD-10-CM

## 2015-12-02 DIAGNOSIS — Z79899 Other long term (current) drug therapy: Secondary | ICD-10-CM

## 2015-12-02 DIAGNOSIS — J449 Chronic obstructive pulmonary disease, unspecified: Secondary | ICD-10-CM

## 2015-12-02 DIAGNOSIS — I2510 Atherosclerotic heart disease of native coronary artery without angina pectoris: Secondary | ICD-10-CM

## 2015-12-02 DIAGNOSIS — R531 Weakness: Secondary | ICD-10-CM

## 2015-12-02 DIAGNOSIS — K4020 Bilateral inguinal hernia, without obstruction or gangrene, not specified as recurrent: Secondary | ICD-10-CM

## 2015-12-02 DIAGNOSIS — Z8701 Personal history of pneumonia (recurrent): Secondary | ICD-10-CM

## 2015-12-02 DIAGNOSIS — I82412 Acute embolism and thrombosis of left femoral vein: Secondary | ICD-10-CM

## 2015-12-02 DIAGNOSIS — M129 Arthropathy, unspecified: Secondary | ICD-10-CM

## 2015-12-02 DIAGNOSIS — I129 Hypertensive chronic kidney disease with stage 1 through stage 4 chronic kidney disease, or unspecified chronic kidney disease: Secondary | ICD-10-CM

## 2015-12-02 DIAGNOSIS — M549 Dorsalgia, unspecified: Secondary | ICD-10-CM

## 2015-12-02 DIAGNOSIS — K769 Liver disease, unspecified: Secondary | ICD-10-CM

## 2015-12-02 DIAGNOSIS — Z5111 Encounter for antineoplastic chemotherapy: Secondary | ICD-10-CM | POA: Diagnosis not present

## 2015-12-02 LAB — CBC WITH DIFFERENTIAL/PLATELET
Basophils Absolute: 0 10*3/uL (ref 0–0.1)
Basophils Relative: 1 %
EOS ABS: 0.1 10*3/uL (ref 0–0.7)
EOS PCT: 2 %
HCT: 31.6 % — ABNORMAL LOW (ref 40.0–52.0)
Hemoglobin: 10.4 g/dL — ABNORMAL LOW (ref 13.0–18.0)
LYMPHS ABS: 0.4 10*3/uL — AB (ref 1.0–3.6)
Lymphocytes Relative: 8 %
MCH: 24.7 pg — AB (ref 26.0–34.0)
MCHC: 33.1 g/dL (ref 32.0–36.0)
MCV: 74.7 fL — ABNORMAL LOW (ref 80.0–100.0)
Monocytes Absolute: 0.5 10*3/uL (ref 0.2–1.0)
Monocytes Relative: 9 %
Neutro Abs: 4.2 10*3/uL (ref 1.4–6.5)
Neutrophils Relative %: 80 %
PLATELETS: 311 10*3/uL (ref 150–440)
RBC: 4.22 MIL/uL — AB (ref 4.40–5.90)
RDW: 21.7 % — ABNORMAL HIGH (ref 11.5–14.5)
WBC: 5.2 10*3/uL (ref 3.8–10.6)

## 2015-12-02 LAB — COMPREHENSIVE METABOLIC PANEL
ALT: 11 U/L — AB (ref 17–63)
ANION GAP: 7 (ref 5–15)
AST: 13 U/L — ABNORMAL LOW (ref 15–41)
Albumin: 3 g/dL — ABNORMAL LOW (ref 3.5–5.0)
Alkaline Phosphatase: 148 U/L — ABNORMAL HIGH (ref 38–126)
BUN: 6 mg/dL (ref 6–20)
CHLORIDE: 99 mmol/L — AB (ref 101–111)
CO2: 27 mmol/L (ref 22–32)
Calcium: 8.8 mg/dL — ABNORMAL LOW (ref 8.9–10.3)
Creatinine, Ser: 0.64 mg/dL (ref 0.61–1.24)
GFR calc non Af Amer: >60 mL/min (ref >60)
Glucose, Bld: 148 mg/dL — ABNORMAL HIGH (ref 65–99)
Potassium: 4 mmol/L (ref 3.5–5.1)
SODIUM: 133 mmol/L — AB (ref 135–145)
Total Bilirubin: 0.3 mg/dL (ref 0.3–1.2)
Total Protein: 7.3 g/dL (ref 6.5–8.1)

## 2015-12-02 MED ORDER — HEPARIN SOD (PORK) LOCK FLUSH 100 UNIT/ML IV SOLN
500.0000 [IU] | Freq: Once | INTRAVENOUS | Status: AC | PRN
  Administered 2015-12-02: 500 [IU]
  Filled 2015-12-02: qty 5

## 2015-12-02 MED ORDER — DEXAMETHASONE SODIUM PHOSPHATE 100 MG/10ML IJ SOLN
10.0000 mg | Freq: Once | INTRAMUSCULAR | Status: AC
  Administered 2015-12-02: 10 mg via INTRAVENOUS
  Filled 2015-12-02: qty 1

## 2015-12-02 MED ORDER — SODIUM CHLORIDE 0.9 % IV SOLN
250.8000 mg | Freq: Once | INTRAVENOUS | Status: AC
  Administered 2015-12-02: 250 mg via INTRAVENOUS
  Filled 2015-12-02: qty 25

## 2015-12-02 MED ORDER — DIPHENHYDRAMINE HCL 50 MG/ML IJ SOLN
25.0000 mg | Freq: Once | INTRAMUSCULAR | Status: AC
  Administered 2015-12-02: 25 mg via INTRAVENOUS
  Filled 2015-12-02: qty 1

## 2015-12-02 MED ORDER — SODIUM CHLORIDE 0.9 % IV SOLN
Freq: Once | INTRAVENOUS | Status: AC
  Administered 2015-12-02: 10:00:00 via INTRAVENOUS
  Filled 2015-12-02: qty 1000

## 2015-12-02 MED ORDER — FAMOTIDINE IN NACL 20-0.9 MG/50ML-% IV SOLN
20.0000 mg | Freq: Once | INTRAVENOUS | Status: AC
  Administered 2015-12-02: 20 mg via INTRAVENOUS
  Filled 2015-12-02: qty 50

## 2015-12-02 MED ORDER — PALONOSETRON HCL INJECTION 0.25 MG/5ML
0.2500 mg | Freq: Once | INTRAVENOUS | Status: AC
  Administered 2015-12-02: 0.25 mg via INTRAVENOUS
  Filled 2015-12-02: qty 5

## 2015-12-02 MED ORDER — PACLITAXEL CHEMO INJECTION 300 MG/50ML
45.0000 mg/m2 | Freq: Once | INTRAVENOUS | Status: AC
  Administered 2015-12-02: 90 mg via INTRAVENOUS
  Filled 2015-12-02: qty 15

## 2015-12-02 MED ORDER — SODIUM CHLORIDE 0.9% FLUSH
10.0000 mL | INTRAVENOUS | Status: DC | PRN
  Administered 2015-12-02: 10 mL via INTRAVENOUS
  Filled 2015-12-02: qty 10

## 2015-12-02 NOTE — Progress Notes (Signed)
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  INTERVAL HISTORY: Patient returns to clinic today for further evaluation and consideration of cycle 5 of weekly carboplatinum and Taxol. He currently feels well and is back to his baseline. He does not complain of left leg pain. His breathing has improved and he now no longer requires oxygen. His weakness and fatigue have improved. He has no neurologic complaints. He denies any fevers. He denies any chest pain or hemoptysis. He has a chronic cough that is unchanged.  He is eating and drinking well. He denies any nausea, vomiting, consultation, or diarrhea. He has no urinary complaints. Patient offers no further specific complaints.  REVIEW OF SYSTEMS:   Review of Systems  Constitutional: Negative for fever, malaise/fatigue and weight loss.  Respiratory: Positive for cough. Negative for hemoptysis and shortness of breath.   Cardiovascular: Negative.  Negative for chest pain.  Gastrointestinal: Negative.  Negative for abdominal pain.  Genitourinary: Negative.   Musculoskeletal: Positive for back pain.  Neurological: Negative for weakness.  Psychiatric/Behavioral: The patient is not nervous/anxious and does not have insomnia.     As per HPI. Otherwise, a complete review of systems is negatve.  PAST MEDICAL HISTORY: Past Medical History:  Diagnosis Date  . Arthritis   . Atrial fibrillation ([place])   . Cancer ([place]) 2017   Lung  . CHF (congestive heart failure) ([place])   . Chronic kidney disease   . COPD (chronic obstructive pulmonary disease) ([place])   . Disease of lung   . DVT (deep venous thrombosis) ([facility])     left leg  . Dysrhythmia   . Headache   . History of chemotherapy   . History of radiation therapy   . Hypertension   . Pneumonia     PAST SURGICAL HISTORY: Past Surgical History:  Procedure Laterality Date  . KNEE SURGERY Bilateral   . PERIPHERAL VASCULAR CATHETERIZATION Left 11/11/2015   Procedure: Thrombectomy;  Surgeon: Katha Cabal, MD;  Location: [place] [facility];  Service: Cardiovascular;  Laterality: Left;  . PORTACATH PLACEMENT N/A 10/29/2015   Procedure: INSERTION PORT-A-CATH;  Surgeon: Nestor Lewandowsky, MD;  Location: [facility];  Service: General;  Laterality: N/A;  . TONSILLECTOMY      FAMILY HISTORY Family History  Problem Relation Age of Onset  . Diabetes Father   . Hypertension Father        ADVANCED DIRECTIVES:    HEALTH MAINTENANCE: Social History  Substance Use Topics  . Smoking status: Former Smoker    Packs/day: 2.00    Years: 50.00    Types: Cigarettes    Quit date: 08/07/2013  . Smokeless tobacco: Never Used  . Alcohol use 0.0 oz/week     Comment: rarely     Allergies  Allergen Reactions  . Aleve [Naproxen Sodium] Hives, Shortness Of Breath and Swelling    Naproxin    Current Outpatient Prescriptions  Medication Sig Dispense Refill  . albuterol (PROAIR HFA) 108 (90 Base) MCG/ACT inhaler Inhale 1-2 puffs into the lungs every 4 (four) hours as needed. 1 Inhaler 3  . apixaban (ELIQUIS) 5 MG TABS tablet Take 1 tablet (5 mg total) by mouth 2 (two) times daily. 180 tablet 3  .  benzonatate (TESSALON) 200 MG capsule Take 200 mg by mouth 3 (three) times daily as needed for cough. Reported on 11/25/2015    . lidocaine-prilocaine (EMLA) cream Apply to affected area once 30 g 3  . Multiple Vitamin (MULTI-VITAMINS) TABS Take 1 tablet by mouth daily.     . ondansetron (ZOFRAN) 8 MG tablet Take 1 tablet (8 mg total) by mouth 2 (two) times daily as needed for refractory nausea / vomiting. Start on day 3 after chemo. 30 tablet 1  . Oxycodone HCl 10 MG  TABS Take 1 tablet (10 mg total) by mouth every 6 (six) hours as needed. 60 tablet 0  . prochlorperazine (COMPAZINE) 10 MG tablet Take 1 tablet (10 mg total) by mouth every 6 (six) hours as needed (Nausea or vomiting). 30 tablet 1  . sucralfate (CARAFATE) 1 g tablet Take 1 tablet (1 g total) by mouth 3 (three) times daily. Dissolve in 2-3 tbsp warm water; swish and swallow. 90 tablet 3   No current facility-administered medications for this visit.    Facility-Administered Medications Ordered in Other Visits  Medication Dose Route Frequency Provider Last Rate Last Dose  . heparin lock flush 100 unit/mL  500 Units Intravenous Once Lloyd Huger, MD      . sodium chloride flush (NS) 0.9 % injection 10 mL  10 mL Intravenous PRN Lloyd Huger, MD        OBJECTIVE: Vitals:   12/02/15 0845  BP: 136/81  Pulse: 86  Resp: 18  Temp: (!) 96 F (35.6 C)     Body mass index is 19.72 kg/m.    ECOG FS:1 - Symptomatic but completely ambulatory  General: Well-developed, well-nourished,  mild respiratory  distress. Eyes: Pink conjunctiva, anicteric sclera. Lungs: Scattered wheezing throughout, improved. Heart: Regular rate and rhythm. No rubs, murmurs, or gallops. Abdomen: Soft, nontender, nondistended. No organomegaly noted, normoactive bowel sounds. Musculoskeletal: Left lower extremity edema. Neuro: Alert, answering all questions appropriately. Cranial nerves grossly intact. Skin: No rashes or petechiae noted. Psych: Normal affect.   LAB RESULTS:  Lab Results  Component Value Date   NA 133 (L) 12/02/2015   K 4.0 12/02/2015   CL 99 (L) 12/02/2015   CO2 27 12/02/2015   GLUCOSE 148 (H) 12/02/2015   BUN 6 12/02/2015   CREATININE 0.64 12/02/2015   CALCIUM 8.8 (L) 12/02/2015   PROT 7.3 12/02/2015   ALBUMIN 3.0 (L) 12/02/2015   AST 13 (L) 12/02/2015   ALT 11 (L) 12/02/2015   ALKPHOS 148 (H) 12/02/2015   BILITOT 0.3 12/02/2015   GFRNONAA >60 12/02/2015   GFRAA >60 12/02/2015     Lab Results  Component Value Date   WBC 5.2 12/02/2015   NEUTROABS 4.2 12/02/2015   HGB 10.4 (L) 12/02/2015   HCT 31.6 (L) 12/02/2015   MCV 74.7 (L) 12/02/2015   PLT 311 12/02/2015     STUDIES: US Venous Img Lower Unilateral Left  Result Date: 11/07/2015 CLINICAL DATA:  Left leg swelling since yesterday. EXAM: Left LOWER EXTREMITY VENOUS DOPPLER ULTRASOUND TECHNIQUE: Gray-scale sonography with graded compression, as well as color Doppler and duplex ultrasound were performed to evaluate the lower extremity deep venous systems from the level of the common femoral vein and including the common femoral, femoral, profunda femoral, popliteal and calf veins including the posterior tibial, peroneal and gastrocnemius veins when visible. The superficial great saphenous vein was also interrogated. Spectral Doppler was utilized to evaluate flow at rest and with distal augmentation maneuvers in the common  femoral, femoral and popliteal veins. COMPARISON:  None. FINDINGS: Contralateral Common Femoral Vein: Nonocclusive thrombus. Common Femoral Vein: Nonocclusive thrombus. Saphenofemoral Junction: Nonocclusive thrombus Profunda Femoral Vein: Occlusive thrombus. Femoral Vein: Occlusive thrombus Popliteal Vein: Occlusive thrombus Calf Veins: Occlusive thrombus Superficial Great Saphenous Vein: Nonocclusive thrombus. IMPRESSION: Study is positive for deep venous thrombosis bilaterally. Nonocclusive thrombus is identified in the left common femoral vein with occlusive thrombus extending down the left femoral, profunda, popliteal, and calf veins. Nonocclusive thrombus identified in the contralateral (right) common femoral vein. Critical Value/emergent results were called by me at the time of interpretation on 11/07/2015 at 9:07 pm to Dr. Marcelene Butte, who verbally acknowledged these results. Electronically Signed   By: Misty Stanley M.D.   On: 11/07/2015 21:08    ASSESSMENT: Clinical stage IIIB poorly differentiated  carcinoma of the lung, right upper lobe.  PLAN:    1. Stage III B poorly differentiated carcinoma of the right upper lobe lung:  MRI of brain negative for metastatic disease.  Pathology consistent with poorly differentiated carcinoma, but there is not enough tissue for additional molecular testing. Proceed with cycle 5 weekly carboplatinum and Taxol today. Continue daily XRT completing today as well. Patient will have a 2 week break and then receive a 10 day boost of XRT. At the conclusion of his XRT, patient will likely benefit from 2 consolidation doses of chemotherapy. Return to clinic in 2 weeks for consideration of cycle 6. 2. Leukocytosis: Resolved. 3. Bilateral DVT: Patient had thrombectomy on November 11, 2015. Continue Eliquis as prescribed. Patient will require anticoagulation for 6 months given the extent of his blood clot. 4. Pain: Continue oxycodone PRN. 5. Shortness of breath: Improved. Continue inhalers as prescribed. Patient states he is no longer using his home oxygen. He will likely need a pulmonary consult in the near future.   Patient expressed understanding and was in agreement with this plan. He also understands that He can call clinic at any time with any questions, concerns, or complaints.   Lung cancer [facility])   Staging form: Lung, AJCC 7th Edition     Clinical stage from 10/17/2015: Stage IIIB (T4, N2, M0) - Signed by Lloyd Huger, MD on 10/17/2015   Lloyd Huger, MD   12/05/2015 8:48 AM

## 2015-12-02 NOTE — Progress Notes (Signed)
States is tired today due to not sleeping well at night. Has persistent cough at night that keeps pt awake. Has been taking tessalon perles and carafate. Pt states the carafate has been helping but the tessalon perles do not help with his cough.

## 2015-12-07 ENCOUNTER — Ambulatory Visit: Payer: Self-pay

## 2015-12-07 ENCOUNTER — Encounter (INDEPENDENT_AMBULATORY_CARE_PROVIDER_SITE_OTHER): Payer: Self-pay

## 2015-12-09 ENCOUNTER — Ambulatory Visit: Payer: Self-pay | Admitting: Radiation Oncology

## 2015-12-09 ENCOUNTER — Encounter: Payer: Self-pay | Admitting: Radiation Oncology

## 2015-12-09 ENCOUNTER — Telehealth: Payer: Self-pay

## 2015-12-09 ENCOUNTER — Ambulatory Visit
Admission: RE | Admit: 2015-12-09 | Discharge: 2015-12-09 | Disposition: A | Discharge status: Home / Self Care | Payer: Medicaid Other | Source: Ambulatory Visit | Attending: Radiation Oncology | Admitting: Radiation Oncology

## 2015-12-09 VITALS — BP 146/86 | HR 108 | Temp 94.9°F | Wt 160.5 lb

## 2015-12-09 DIAGNOSIS — C3411 Malignant neoplasm of upper lobe, right bronchus or lung: Secondary | ICD-10-CM | POA: Diagnosis not present

## 2015-12-09 DIAGNOSIS — Z5111 Encounter for antineoplastic chemotherapy: Secondary | ICD-10-CM | POA: Insufficient documentation

## 2015-12-09 DIAGNOSIS — Z510 Encounter for antineoplastic radiation therapy: Secondary | ICD-10-CM | POA: Diagnosis not present

## 2015-12-09 MED ORDER — BENZONATATE 200 MG PO CAPS: 200.0000 mg | ORAL_CAPSULE | Freq: Three times a day (TID) | ORAL | 0 refills | Status: DC | PRN

## 2015-12-09 MED ORDER — ONDANSETRON HCL 8 MG PO TABS: 8.0000 mg | ORAL_TABLET | Freq: Two times a day (BID) | ORAL | 1 refills | Status: AC | PRN

## 2015-12-09 NOTE — Telephone Encounter (Signed)
[facility] is going to help patient by filling his Tessalon and Zofran thru their clinic.  They are needing new prescriptions sent to them by fax [phone] or electronically.    The Tessalon and Zofran medications are on the patient's current list with Zofran last filled on [employer].  Prescriptions sent vial electronic rx.

## 2015-12-09 NOTE — Telephone Encounter (Signed)
Thank you :)

## 2015-12-09 NOTE — Progress Notes (Signed)
Radiation Oncology Follow up Note  Name: Sean Hall   Date:   12/09/2015 MRN:  [id number] DOB: 1951-02-23    This 66 y.o. male presents to the clinic today for reevaluation status post initial radiation therapy to his right upper lobe for poorly differentiated carcinoma.  REFERRING PROVIDER: Nestor Lewandowsky, MD  HPI: Patient is a 65 year old male who just completed concurrent chemoradiation for locally advanced non-small cell poorly differentia carcinoma the right upper lobe. He had direct mediastinal invasion as well as right mediastinal nodal metastasis. He has done fairly well. He is seen today for possible of small field lung boost. He is doing fairly well specifically denies chest tightness or decreased pulmonary capacity. He has been having some slight hemoptysis and cough..  COMPLICATIONS OF TREATMENT: none  FOLLOW UP COMPLIANCE: keeps appointments   PHYSICAL EXAM:  BP (!) 146/86 (BP Location: Left Arm, Patient Position: Sitting, Cuff Size: Normal)   Pulse (!) 108   Temp (!) 94.9 F (34.9 C) (Tympanic)   Wt 160 lb 7.9 oz (72.8 kg)   BMI 20.06 kg/m  Well-developed well-nourished patient in NAD. HEENT reveals PERLA, EOMI, discs not visualized.  Oral cavity is clear. No oral mucosal lesions are identified. Neck is clear without evidence of cervical or supraclavicular adenopathy. Lungs are clear to A&P. Cardiac examination is essentially unremarkable with regular rate and rhythm without murmur rub or thrill. Abdomen is benign with no organomegaly or masses noted. Motor sensory and DTR levels are equal and symmetric in the upper and lower extremities. Cranial nerves II through XII are grossly intact. Proprioception is intact. No peripheral adenopathy or edema is identified. No motor or sensory levels are noted. Crude visual fields are within normal range.  RADIOLOGY RESULTS: We CT the patient showing good response with decreased size of right upper lobe mass as well as decreased  opacification of the right upper lobe.  PLAN: At this time we'll go ahead with small field boost to 2400 cGy giving a total of 6400 cGy to his chest with concurrent chemotherapy. Again risks and benefits of treatment were again reviewed with the patient. We'll perform CT simulation today and will treat as outlined above. Patient contemplates might treatment plan well.  I would like to take this opportunity to thank you for allowing me to participate in the care of your patient.Armstead Peaks., MD

## 2015-12-10 ENCOUNTER — Other Ambulatory Visit: Payer: Self-pay | Admitting: Oncology

## 2015-12-15 DIAGNOSIS — Z5111 Encounter for antineoplastic chemotherapy: Secondary | ICD-10-CM | POA: Diagnosis not present

## 2015-12-16 ENCOUNTER — Ambulatory Visit
Admission: RE | Admit: 2015-12-16 | Discharge: 2015-12-16 | Disposition: A | Discharge status: Home / Self Care | Payer: Medicaid Other | Source: Ambulatory Visit | Attending: Radiation Oncology | Admitting: Radiation Oncology

## 2015-12-16 ENCOUNTER — Inpatient Hospital Stay: Payer: Medicaid Other

## 2015-12-16 ENCOUNTER — Inpatient Hospital Stay: Payer: Medicaid Other | Attending: Oncology

## 2015-12-16 VITALS — BP 150/74 | HR 88 | Temp 98.4°F | Resp 18

## 2015-12-16 DIAGNOSIS — Z8701 Personal history of pneumonia (recurrent): Secondary | ICD-10-CM | POA: Insufficient documentation

## 2015-12-16 DIAGNOSIS — I129 Hypertensive chronic kidney disease with stage 1 through stage 4 chronic kidney disease, or unspecified chronic kidney disease: Secondary | ICD-10-CM | POA: Diagnosis not present

## 2015-12-16 DIAGNOSIS — Z5111 Encounter for antineoplastic chemotherapy: Secondary | ICD-10-CM | POA: Diagnosis present

## 2015-12-16 DIAGNOSIS — I499 Cardiac arrhythmia, unspecified: Secondary | ICD-10-CM | POA: Insufficient documentation

## 2015-12-16 DIAGNOSIS — Z9221 Personal history of antineoplastic chemotherapy: Secondary | ICD-10-CM | POA: Diagnosis not present

## 2015-12-16 DIAGNOSIS — I4891 Unspecified atrial fibrillation: Secondary | ICD-10-CM | POA: Insufficient documentation

## 2015-12-16 DIAGNOSIS — Z923 Personal history of irradiation: Secondary | ICD-10-CM | POA: Diagnosis not present

## 2015-12-16 DIAGNOSIS — N189 Chronic kidney disease, unspecified: Secondary | ICD-10-CM | POA: Insufficient documentation

## 2015-12-16 DIAGNOSIS — M129 Arthropathy, unspecified: Secondary | ICD-10-CM | POA: Diagnosis not present

## 2015-12-16 DIAGNOSIS — J449 Chronic obstructive pulmonary disease, unspecified: Secondary | ICD-10-CM | POA: Diagnosis not present

## 2015-12-16 DIAGNOSIS — I509 Heart failure, unspecified: Secondary | ICD-10-CM | POA: Diagnosis not present

## 2015-12-16 DIAGNOSIS — Z86718 Personal history of other venous thrombosis and embolism: Secondary | ICD-10-CM | POA: Diagnosis not present

## 2015-12-16 DIAGNOSIS — Z8669 Personal history of other diseases of the nervous system and sense organs: Secondary | ICD-10-CM | POA: Insufficient documentation

## 2015-12-16 DIAGNOSIS — C3411 Malignant neoplasm of upper lobe, right bronchus or lung: Secondary | ICD-10-CM | POA: Insufficient documentation

## 2015-12-16 LAB — CBC WITH DIFFERENTIAL/PLATELET
BASOS ABS: 0.1 10*3/uL (ref 0–0.1)
Basophils Relative: 1 %
EOS ABS: 0.1 10*3/uL (ref 0–0.7)
EOS PCT: 3 %
HCT: 33 % — ABNORMAL LOW (ref 40.0–52.0)
Hemoglobin: 11.1 g/dL — ABNORMAL LOW (ref 13.0–18.0)
LYMPHS ABS: 0.7 10*3/uL — AB (ref 1.0–3.6)
Lymphocytes Relative: 13 %
MCH: 25.8 pg — AB (ref 26.0–34.0)
MCHC: 33.8 g/dL (ref 32.0–36.0)
MCV: 76.4 fL — AB (ref 80.0–100.0)
MONO ABS: 0.7 10*3/uL (ref 0.2–1.0)
Monocytes Relative: 14 %
Neutro Abs: 3.6 10*3/uL (ref 1.4–6.5)
Neutrophils Relative %: 69 %
PLATELETS: 242 10*3/uL (ref 150–440)
RBC: 4.31 MIL/uL — AB (ref 4.40–5.90)
RDW: 24.4 % — AB (ref 11.5–14.5)
WBC: 5.3 10*3/uL (ref 3.8–10.6)

## 2015-12-16 LAB — COMPREHENSIVE METABOLIC PANEL
ALT: 13 U/L — AB (ref 17–63)
AST: 15 U/L (ref 15–41)
Albumin: 3.3 g/dL — ABNORMAL LOW (ref 3.5–5.0)
Alkaline Phosphatase: 160 U/L — ABNORMAL HIGH (ref 38–126)
Anion gap: 7 (ref 5–15)
BUN: 9 mg/dL (ref 6–20)
CHLORIDE: 99 mmol/L — AB (ref 101–111)
CO2: 28 mmol/L (ref 22–32)
CREATININE: 0.8 mg/dL (ref 0.61–1.24)
Calcium: 8.8 mg/dL — ABNORMAL LOW (ref 8.9–10.3)
Glucose, Bld: 146 mg/dL — ABNORMAL HIGH (ref 65–99)
POTASSIUM: 4 mmol/L (ref 3.5–5.1)
SODIUM: 134 mmol/L — AB (ref 135–145)
Total Bilirubin: 0.3 mg/dL (ref 0.3–1.2)
Total Protein: 7.1 g/dL (ref 6.5–8.1)

## 2015-12-16 MED ORDER — HEPARIN SOD (PORK) LOCK FLUSH 100 UNIT/ML IV SOLN
500.0000 [IU] | Freq: Once | INTRAVENOUS | Status: AC | PRN
  Administered 2015-12-16: 500 [IU]
  Filled 2015-12-16: qty 5

## 2015-12-16 MED ORDER — SODIUM CHLORIDE 0.9 % IV SOLN
Freq: Once | INTRAVENOUS | Status: AC
  Administered 2015-12-16: 11:00:00 via INTRAVENOUS
  Filled 2015-12-16: qty 1000

## 2015-12-16 MED ORDER — PALONOSETRON HCL INJECTION 0.25 MG/5ML
0.2500 mg | Freq: Once | INTRAVENOUS | Status: AC
  Administered 2015-12-16: 0.25 mg via INTRAVENOUS
  Filled 2015-12-16: qty 5

## 2015-12-16 MED ORDER — SODIUM CHLORIDE 0.9% FLUSH
10.0000 mL | INTRAVENOUS | Status: DC | PRN
  Administered 2015-12-16: 10 mL
  Filled 2015-12-16: qty 10

## 2015-12-16 MED ORDER — SODIUM CHLORIDE 0.9 % IV SOLN
10.0000 mg | Freq: Once | INTRAVENOUS | Status: AC
  Administered 2015-12-16: 10 mg via INTRAVENOUS
  Filled 2015-12-16: qty 1

## 2015-12-16 MED ORDER — PACLITAXEL CHEMO INJECTION 300 MG/50ML
45.0000 mg/m2 | Freq: Once | INTRAVENOUS | Status: AC
  Administered 2015-12-16: 90 mg via INTRAVENOUS
  Filled 2015-12-16: qty 15

## 2015-12-16 MED ORDER — SODIUM CHLORIDE 0.9 % IV SOLN
250.8000 mg | Freq: Once | INTRAVENOUS | Status: AC
  Administered 2015-12-16: 250 mg via INTRAVENOUS
  Filled 2015-12-16: qty 25

## 2015-12-16 MED ORDER — DIPHENHYDRAMINE HCL 50 MG/ML IJ SOLN
25.0000 mg | Freq: Once | INTRAMUSCULAR | Status: AC
  Administered 2015-12-16: 25 mg via INTRAVENOUS
  Filled 2015-12-16: qty 1

## 2015-12-16 MED ORDER — FAMOTIDINE IN NACL 20-0.9 MG/50ML-% IV SOLN
20.0000 mg | Freq: Once | INTRAVENOUS | Status: AC
  Administered 2015-12-16: 20 mg via INTRAVENOUS
  Filled 2015-12-16: qty 50

## 2015-12-17 ENCOUNTER — Ambulatory Visit
Admission: RE | Admit: 2015-12-17 | Discharge: 2015-12-17 | Disposition: A | Discharge status: Home / Self Care | Payer: Medicaid Other | Source: Ambulatory Visit | Attending: Radiation Oncology | Admitting: Radiation Oncology

## 2015-12-17 DIAGNOSIS — Z5111 Encounter for antineoplastic chemotherapy: Secondary | ICD-10-CM | POA: Diagnosis not present

## 2015-12-18 ENCOUNTER — Ambulatory Visit
Admission: RE | Admit: 2015-12-18 | Discharge: 2015-12-18 | Disposition: A | Discharge status: Home / Self Care | Payer: Medicaid Other | Source: Ambulatory Visit | Attending: Radiation Oncology | Admitting: Radiation Oncology

## 2015-12-18 DIAGNOSIS — Z5111 Encounter for antineoplastic chemotherapy: Secondary | ICD-10-CM | POA: Diagnosis not present

## 2015-12-21 ENCOUNTER — Ambulatory Visit
Admission: RE | Admit: 2015-12-21 | Discharge: 2015-12-21 | Disposition: A | Discharge status: Home / Self Care | Payer: Medicaid Other | Source: Ambulatory Visit | Attending: Radiation Oncology | Admitting: Radiation Oncology

## 2015-12-21 DIAGNOSIS — Z5111 Encounter for antineoplastic chemotherapy: Secondary | ICD-10-CM | POA: Diagnosis not present

## 2015-12-22 ENCOUNTER — Ambulatory Visit
Admission: RE | Admit: 2015-12-22 | Discharge: 2015-12-22 | Disposition: A | Discharge status: Home / Self Care | Payer: Medicaid Other | Source: Ambulatory Visit | Attending: Radiation Oncology | Admitting: Radiation Oncology

## 2015-12-22 DIAGNOSIS — Z5111 Encounter for antineoplastic chemotherapy: Secondary | ICD-10-CM | POA: Diagnosis not present

## 2015-12-22 NOTE — Progress Notes (Signed)
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  INTERVAL HISTORY: Patient returns to clinic today for further evaluation and consideration of cycle 6 of weekly carboplatinum and Taxol. He recently restarted his "boost" XRT. He currently feels well and is asymptomatic. He does not complain of left leg pain. His breathing has improved and he now no longer requires oxygen. His weakness and fatigue have improved. He has no neurologic complaints. He denies any fevers. He denies any chest pain or hemoptysis. He has a chronic cough that is unchanged.  He is eating and drinking well. He denies any nausea, vomiting, consultation, or diarrhea. He has no urinary complaints. Patient offers no specific complaints today.  REVIEW OF SYSTEMS:   Review of Systems  Constitutional: Negative for fever, malaise/fatigue and weight loss.  Respiratory: Negative for cough, hemoptysis and shortness of breath.   Cardiovascular: Negative.  Negative for chest pain.  Gastrointestinal: Negative.  Negative for abdominal pain.  Genitourinary: Negative.   Musculoskeletal: Negative.  Negative for back pain.  Neurological: Negative.  Negative for weakness.  Psychiatric/Behavioral: The patient is not nervous/anxious and does not have insomnia.     As per HPI. Otherwise, a complete review of systems is negatve.  PAST MEDICAL HISTORY: Past Medical History:  Diagnosis Date  . Arthritis   . Atrial fibrillation ([place])   . Cancer ([place]) 2017   Lung  . CHF (congestive heart failure) ([place])   . Chronic kidney disease   . COPD (chronic obstructive pulmonary disease) ([place])   . Disease of lung     . DVT (deep venous thrombosis) ([facility])    left leg  . Dysrhythmia   . Headache   . History of chemotherapy   . History of radiation therapy   . Hypertension   . Pneumonia     PAST SURGICAL HISTORY: Past Surgical History:  Procedure Laterality Date  . KNEE SURGERY Bilateral   . PERIPHERAL VASCULAR CATHETERIZATION Left 11/11/2015   Procedure: Thrombectomy;  Surgeon: Katha Cabal, MD;  Location: [facility];  Service: Cardiovascular;  Laterality: Left;  . PORTACATH PLACEMENT N/A 10/29/2015   Procedure: INSERTION PORT-A-CATH;  Surgeon: Nestor Lewandowsky, MD;  Location: [facility];  Service: General;  Laterality: N/A;  . TONSILLECTOMY      FAMILY HISTORY Family History  Problem Relation Age of Onset  . Diabetes Father   . Hypertension Father        ADVANCED DIRECTIVES:    HEALTH MAINTENANCE: Social History  Substance Use Topics  . Smoking status: Former Smoker    Packs/day: 2.00    Years: 50.00    Types: Cigarettes    Quit date: 08/07/2013  . Smokeless tobacco: Never Used  . Alcohol use 0.0 oz/week     Comment: rarely     Allergies  Allergen Reactions  . Aleve [Naproxen Sodium] Hives, Shortness Of Breath and Swelling    Naproxin    Current Outpatient Prescriptions  Medication Sig Dispense Refill  . albuterol (PROAIR HFA) 108 (90 Base) MCG/ACT inhaler Inhale 1-2 puffs into the lungs every 4 (four) hours as needed. 1 Inhaler 3  . apixaban (ELIQUIS) 5 MG TABS tablet Take 1 tablet (5 mg total) by mouth 2 (two)  times daily. 180 tablet 3  . benzonatate (TESSALON) 200 MG capsule Take 1 capsule (200 mg total) by mouth 3 (three) times daily as needed for cough. Reported on 11/25/2015 30 capsule 0  . lidocaine-prilocaine (EMLA) cream Apply to affected area once 30 g 3  . Multiple Vitamin (MULTI-VITAMINS) TABS Take 1 tablet by mouth daily.     . ondansetron (ZOFRAN) 8 MG tablet Take 1 tablet (8 mg total) by mouth 2 (two) times daily as needed for refractory nausea /  vomiting. Start on day 3 after chemo. 30 tablet 1  . Oxycodone HCl 10 MG TABS Take 1 tablet (10 mg total) by mouth every 6 (six) hours as needed. 60 tablet 0  . prochlorperazine (COMPAZINE) 10 MG tablet Take 1 tablet (10 mg total) by mouth every 6 (six) hours as needed (Nausea or vomiting). 30 tablet 1  . sucralfate (CARAFATE) 1 g tablet Take 1 tablet (1 g total) by mouth 3 (three) times daily. Dissolve in 2-3 tbsp warm water; swish and swallow. 90 tablet 3   No current facility-administered medications for this visit.    Facility-Administered Medications Ordered in Other Visits  Medication Dose Route Frequency Provider Last Rate Last Dose  . 0.9 %  sodium chloride infusion   Intravenous Once Lloyd Huger, MD      . CARBOplatin (PARAPLATIN) 250 mg in sodium chloride 0.9 % 100 mL chemo infusion  250 mg Intravenous Once Lloyd Huger, MD      . dexamethasone (DECADRON) 10 mg in sodium chloride 0.9 % 50 mL IVPB  10 mg Intravenous Once Lloyd Huger, MD      . diphenhydrAMINE (BENADRYL) injection 25 mg  25 mg Intravenous Once Lloyd Huger, MD      . famotidine (PEPCID) IVPB 20 mg premix  20 mg Intravenous Once Lloyd Huger, MD      . heparin lock flush 100 unit/mL  500 Units Intravenous Once Lloyd Huger, MD      . PACLitaxel (TAXOL) 90 mg in dextrose 5 % 250 mL chemo infusion (</= '80mg'$ /m2)  45 mg/m2 (Treatment Plan Recorded) Intravenous Once Lloyd Huger, MD      . palonosetron (ALOXI) injection 0.25 mg  0.25 mg Intravenous Once Lloyd Huger, MD      . sodium chloride flush (NS) 0.9 % injection 10 mL  10 mL Intravenous PRN Lloyd Huger, MD        OBJECTIVE: Vitals:   12/23/15 0906  BP: (!) 146/85  Pulse: (!) 103  Temp: 97 F (36.1 C)     Body mass index is 20.67 kg/m.    ECOG FS:1 - Symptomatic but completely ambulatory  General: Well-developed, well-nourished,  mild respiratory  distress. Eyes: Pink conjunctiva, anicteric  sclera. Lungs: Scattered wheezing throughout, improved. Heart: Regular rate and rhythm. No rubs, murmurs, or gallops. Abdomen: Soft, nontender, nondistended. No organomegaly noted, normoactive bowel sounds. Musculoskeletal: Left lower extremity edema. Neuro: Alert, answering all questions appropriately. Cranial nerves grossly intact. Skin: No rashes or petechiae noted. Psych: Normal affect.   LAB RESULTS:  Lab Results  Component Value Date   NA 135 12/23/2015   K 3.9 12/23/2015   CL 100 (L) 12/23/2015   CO2 26 12/23/2015   GLUCOSE 121 (H) 12/23/2015   BUN 14 12/23/2015   CREATININE 0.76 12/23/2015   CALCIUM 9.0 12/23/2015   PROT 7.8 12/23/2015   ALBUMIN 3.6 12/23/2015   AST 19 12/23/2015   ALT 15 (L)  12/23/2015   ALKPHOS 142 (H) 12/23/2015   BILITOT 0.3 12/23/2015   GFRNONAA >60 12/23/2015   GFRAA >60 12/23/2015    Lab Results  Component Value Date   WBC 5.4 12/23/2015   NEUTROABS 3.9 12/23/2015   HGB 11.8 (L) 12/23/2015   HCT 34.6 (L) 12/23/2015   MCV 76.8 (L) 12/23/2015   PLT 249 12/23/2015     STUDIES: No results found.  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  PLAN:    1. Stage III B poorly differentiated carcinoma of the right upper lobe lung:  MRI of brain negative for metastatic disease.  Pathology consistent with poorly differentiated carcinoma, but there is not enough tissue for additional molecular testing. Proceed with cycle 6 weekly carboplatinum and Taxol today. Continue daily XRT completingOn January 04, 2016.  At the conclusion of his XRT, patient will likely benefit from 2 consolidation doses of chemotherapy. Return to clinic in 1 week for consideration of cycle 7. 2. Leukocytosis: Resolved. 3. Bilateral DVT: Patient had thrombectomy on November 11, 2015. Continue Eliquis as prescribed. Patient will require anticoagulation for 6 months given the extent of his blood clot. 4. Pain: Continue oxycodone  PRN. 5. Shortness of breath: Improved. Continue inhalers as prescribed. Patient states he is no longer using his home oxygen. He will likely need a pulmonary consult in the near future.   Patient expressed understanding and was in agreement with this plan. He also understands that He can call clinic at any time with any questions, concerns, or complaints.   Lung cancer [facility])   Staging form: Lung, AJCC 7th Edition     Clinical stage from 10/17/2015: Stage IIIB (T4, N2, M0) - Signed by Lloyd Huger, MD on 10/17/2015   Lloyd Huger, MD   12/23/2015 10:09 AM

## 2015-12-23 ENCOUNTER — Ambulatory Visit
Admission: RE | Admit: 2015-12-23 | Discharge: 2015-12-23 | Disposition: A | Discharge status: Home / Self Care | Payer: Medicaid Other | Source: Ambulatory Visit | Attending: Radiation Oncology | Admitting: Radiation Oncology

## 2015-12-23 ENCOUNTER — Inpatient Hospital Stay: Payer: Medicaid Other

## 2015-12-23 ENCOUNTER — Inpatient Hospital Stay (HOSPITAL_BASED_OUTPATIENT_CLINIC_OR_DEPARTMENT_OTHER): Payer: Medicaid Other | Admitting: Oncology

## 2015-12-23 VITALS — BP 146/85 | HR 103 | Temp 97.0°F | Wt 165.3 lb

## 2015-12-23 DIAGNOSIS — Z923 Personal history of irradiation: Secondary | ICD-10-CM

## 2015-12-23 DIAGNOSIS — N189 Chronic kidney disease, unspecified: Secondary | ICD-10-CM

## 2015-12-23 DIAGNOSIS — M129 Arthropathy, unspecified: Secondary | ICD-10-CM

## 2015-12-23 DIAGNOSIS — C3411 Malignant neoplasm of upper lobe, right bronchus or lung: Secondary | ICD-10-CM

## 2015-12-23 DIAGNOSIS — I4891 Unspecified atrial fibrillation: Secondary | ICD-10-CM

## 2015-12-23 DIAGNOSIS — J449 Chronic obstructive pulmonary disease, unspecified: Secondary | ICD-10-CM

## 2015-12-23 DIAGNOSIS — I499 Cardiac arrhythmia, unspecified: Secondary | ICD-10-CM

## 2015-12-23 DIAGNOSIS — I509 Heart failure, unspecified: Secondary | ICD-10-CM

## 2015-12-23 DIAGNOSIS — Z8701 Personal history of pneumonia (recurrent): Secondary | ICD-10-CM

## 2015-12-23 DIAGNOSIS — Z9221 Personal history of antineoplastic chemotherapy: Secondary | ICD-10-CM

## 2015-12-23 DIAGNOSIS — Z86718 Personal history of other venous thrombosis and embolism: Secondary | ICD-10-CM

## 2015-12-23 DIAGNOSIS — Z5111 Encounter for antineoplastic chemotherapy: Secondary | ICD-10-CM | POA: Diagnosis not present

## 2015-12-23 DIAGNOSIS — I129 Hypertensive chronic kidney disease with stage 1 through stage 4 chronic kidney disease, or unspecified chronic kidney disease: Secondary | ICD-10-CM

## 2015-12-23 DIAGNOSIS — Z8669 Personal history of other diseases of the nervous system and sense organs: Secondary | ICD-10-CM

## 2015-12-23 LAB — CBC WITH DIFFERENTIAL/PLATELET
BASOS ABS: 0.1 10*3/uL (ref 0–0.1)
Basophils Relative: 1 %
EOS ABS: 0.3 10*3/uL (ref 0–0.7)
Eosinophils Relative: 5 %
HCT: 34.6 % — ABNORMAL LOW (ref 40.0–52.0)
HEMOGLOBIN: 11.8 g/dL — AB (ref 13.0–18.0)
LYMPHS ABS: 0.6 10*3/uL — AB (ref 1.0–3.6)
LYMPHS PCT: 11 %
MCH: 26.2 pg (ref 26.0–34.0)
MCHC: 34.1 g/dL (ref 32.0–36.0)
MCV: 76.8 fL — ABNORMAL LOW (ref 80.0–100.0)
MONO ABS: 0.6 10*3/uL (ref 0.2–1.0)
Monocytes Relative: 11 %
NEUTROS ABS: 3.9 10*3/uL (ref 1.4–6.5)
Neutrophils Relative %: 72 %
PLATELETS: 249 10*3/uL (ref 150–440)
RBC: 4.5 MIL/uL (ref 4.40–5.90)
RDW: 23.7 % — ABNORMAL HIGH (ref 11.5–14.5)
WBC: 5.4 10*3/uL (ref 3.8–10.6)

## 2015-12-23 LAB — COMPREHENSIVE METABOLIC PANEL
ALBUMIN: 3.6 g/dL (ref 3.5–5.0)
ALK PHOS: 142 U/L — AB (ref 38–126)
ALT: 15 U/L — AB (ref 17–63)
AST: 19 U/L (ref 15–41)
Anion gap: 9 (ref 5–15)
BUN: 14 mg/dL (ref 6–20)
CALCIUM: 9 mg/dL (ref 8.9–10.3)
CHLORIDE: 100 mmol/L — AB (ref 101–111)
CO2: 26 mmol/L (ref 22–32)
CREATININE: 0.76 mg/dL (ref 0.61–1.24)
GFR calc Af Amer: >60 mL/min (ref >60)
GFR calc non Af Amer: >60 mL/min (ref >60)
GLUCOSE: 121 mg/dL — AB (ref 65–99)
Potassium: 3.9 mmol/L (ref 3.5–5.1)
SODIUM: 135 mmol/L (ref 135–145)
Total Bilirubin: 0.3 mg/dL (ref 0.3–1.2)
Total Protein: 7.8 g/dL (ref 6.5–8.1)

## 2015-12-23 MED ORDER — HEPARIN SOD (PORK) LOCK FLUSH 100 UNIT/ML IV SOLN
500.0000 [IU] | Freq: Once | INTRAVENOUS | Status: AC
  Administered 2015-12-23: 500 [IU] via INTRAVENOUS
  Filled 2015-12-23: qty 5

## 2015-12-23 MED ORDER — PACLITAXEL CHEMO INJECTION 300 MG/50ML
45.0000 mg/m2 | Freq: Once | INTRAVENOUS | Status: AC
  Administered 2015-12-23: 90 mg via INTRAVENOUS
  Filled 2015-12-23: qty 15

## 2015-12-23 MED ORDER — SODIUM CHLORIDE 0.9 % IJ SOLN
10.0000 mL | Freq: Once | INTRAMUSCULAR | Status: AC
  Administered 2015-12-23: 10 mL via INTRAVENOUS
  Filled 2015-12-23: qty 10

## 2015-12-23 MED ORDER — FAMOTIDINE IN NACL 20-0.9 MG/50ML-% IV SOLN
20.0000 mg | Freq: Once | INTRAVENOUS | Status: AC
  Administered 2015-12-23: 20 mg via INTRAVENOUS
  Filled 2015-12-23: qty 50

## 2015-12-23 MED ORDER — DIPHENHYDRAMINE HCL 50 MG/ML IJ SOLN
25.0000 mg | Freq: Once | INTRAMUSCULAR | Status: AC
  Administered 2015-12-23: 25 mg via INTRAVENOUS
  Filled 2015-12-23: qty 1

## 2015-12-23 MED ORDER — SODIUM CHLORIDE 0.9 % IV SOLN
10.0000 mg | Freq: Once | INTRAVENOUS | Status: AC
  Administered 2015-12-23: 10 mg via INTRAVENOUS
  Filled 2015-12-23: qty 1

## 2015-12-23 MED ORDER — SODIUM CHLORIDE 0.9 % IV SOLN
250.8000 mg | Freq: Once | INTRAVENOUS | Status: AC
  Administered 2015-12-23: 250 mg via INTRAVENOUS
  Filled 2015-12-23: qty 25

## 2015-12-23 MED ORDER — PALONOSETRON HCL INJECTION 0.25 MG/5ML
0.2500 mg | Freq: Once | INTRAVENOUS | Status: AC
  Administered 2015-12-23: 0.25 mg via INTRAVENOUS
  Filled 2015-12-23: qty 5

## 2015-12-23 MED ORDER — SODIUM CHLORIDE 0.9 % IV SOLN
Freq: Once | INTRAVENOUS | Status: AC
  Administered 2015-12-23: 10:00:00 via INTRAVENOUS
  Filled 2015-12-23: qty 1000

## 2015-12-23 NOTE — Progress Notes (Signed)
Patient ambulates without assistance, accompanied by his wife.  Patient denies pain or discomfort at this time.  Vitals BP 146/85 HR 103 Temp 97.0

## 2015-12-24 ENCOUNTER — Ambulatory Visit
Admission: RE | Admit: 2015-12-24 | Discharge: 2015-12-24 | Disposition: A | Discharge status: Home / Self Care | Payer: Medicaid Other | Source: Ambulatory Visit | Attending: Radiation Oncology | Admitting: Radiation Oncology

## 2015-12-24 DIAGNOSIS — Z5111 Encounter for antineoplastic chemotherapy: Secondary | ICD-10-CM | POA: Diagnosis not present

## 2015-12-25 ENCOUNTER — Ambulatory Visit
Admission: RE | Admit: 2015-12-25 | Discharge: 2015-12-25 | Disposition: A | Discharge status: Home / Self Care | Payer: Medicaid Other | Source: Ambulatory Visit | Attending: Radiation Oncology | Admitting: Radiation Oncology

## 2015-12-25 DIAGNOSIS — Z5111 Encounter for antineoplastic chemotherapy: Secondary | ICD-10-CM | POA: Diagnosis not present

## 2015-12-28 ENCOUNTER — Ambulatory Visit
Admission: RE | Admit: 2015-12-28 | Discharge: 2015-12-28 | Disposition: A | Discharge status: Home / Self Care | Payer: Medicaid Other | Source: Ambulatory Visit | Attending: Radiation Oncology | Admitting: Radiation Oncology

## 2015-12-28 DIAGNOSIS — Z5111 Encounter for antineoplastic chemotherapy: Secondary | ICD-10-CM | POA: Diagnosis not present

## 2015-12-29 ENCOUNTER — Ambulatory Visit
Admission: RE | Admit: 2015-12-29 | Discharge: 2015-12-29 | Disposition: A | Discharge status: Home / Self Care | Payer: Medicaid Other | Source: Ambulatory Visit | Attending: Radiation Oncology | Admitting: Radiation Oncology

## 2015-12-29 DIAGNOSIS — Z5111 Encounter for antineoplastic chemotherapy: Secondary | ICD-10-CM | POA: Diagnosis not present

## 2015-12-29 NOTE — Progress Notes (Signed)
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  INTERVAL HISTORY: Patient returns to clinic today for further evaluation and consideration of cycle 7 of weekly carboplatinum and Taxol. He currently feels well and is asymptomatic. He does not complain of left leg pain. His breathing has improved and he now no longer requires oxygen. His weakness and fatigue have improved. He has no neurologic complaints. He denies any fevers. He denies any chest pain or hemoptysis. He has a chronic cough that is unchanged.  He is eating and drinking well. He denies any nausea, vomiting, consultation, or diarrhea. He has no urinary complaints. Patient offers no specific complaints today.  REVIEW OF SYSTEMS:   Review of Systems  Constitutional: Negative for fever, malaise/fatigue and weight loss.  Respiratory: Negative for cough, hemoptysis and shortness of breath.   Cardiovascular: Negative.  Negative for chest pain.  Gastrointestinal: Negative.  Negative for abdominal pain.  Genitourinary: Negative.   Musculoskeletal: Negative.  Negative for back pain.  Neurological: Negative.  Negative for weakness.  Psychiatric/Behavioral: The patient is not nervous/anxious and does not have insomnia.     As per HPI. Otherwise, a complete review of systems is negatve.  PAST MEDICAL HISTORY: Past Medical History:  Diagnosis Date  . Arthritis   . Atrial fibrillation ([place])   . Cancer ([place]) 2017   Lung  . CHF (congestive heart failure) ([place])   . Chronic kidney disease   . COPD (chronic obstructive pulmonary disease) ([place])   . Disease of lung   . DVT (deep venous thrombosis) ([facility])      left leg  . Dysrhythmia   . Headache   . History of chemotherapy   . History of radiation therapy   . Hypertension   . Pneumonia     PAST SURGICAL HISTORY: Past Surgical History:  Procedure Laterality Date  . KNEE SURGERY Bilateral   . PERIPHERAL VASCULAR CATHETERIZATION Left 11/11/2015   Procedure: Thrombectomy;  Surgeon: Katha Cabal, MD;  Location: [facility];  Service: Cardiovascular;  Laterality: Left;  . PORTACATH PLACEMENT N/A 10/29/2015   Procedure: INSERTION PORT-A-CATH;  Surgeon: Nestor Lewandowsky, MD;  Location: [facility];  Service: General;  Laterality: N/A;  . TONSILLECTOMY      FAMILY HISTORY Family History  Problem Relation Age of Onset  . Diabetes Father   . Hypertension Father        ADVANCED DIRECTIVES:    HEALTH MAINTENANCE: Social History  Substance Use Topics  . Smoking status: Former Smoker    Packs/day: 2.00    Years: 50.00    Types: Cigarettes    Quit date: 08/07/2013  . Smokeless tobacco: Never Used  . Alcohol use 0.0 oz/week     Comment: rarely     Allergies  Allergen Reactions  . Aleve [Naproxen Sodium] Hives, Shortness Of Breath and Swelling    Naproxin    Current Outpatient Prescriptions  Medication Sig Dispense Refill  . albuterol (PROAIR HFA) 108 (90 Base) MCG/ACT inhaler Inhale 1-2 puffs into the lungs every 4 (four) hours as needed. 1 Inhaler 3  . apixaban (ELIQUIS) 5 MG TABS tablet Take 1 tablet (5 mg total) by mouth 2 (two) times daily. 180 tablet 3  .  benzonatate (TESSALON) 200 MG capsule Take 1 capsule (200 mg total) by mouth 3 (three) times daily as needed for cough. Reported on 11/25/2015 30 capsule 0  . lidocaine-prilocaine (EMLA) cream Apply to affected area once 30 g 3  . Multiple Vitamin (MULTI-VITAMINS) TABS Take 1 tablet by mouth daily.     . ondansetron (ZOFRAN) 8 MG tablet Take 1 tablet (8 mg total) by mouth 2 (two) times daily as needed for refractory nausea / vomiting. Start on day 3 after chemo. 30  tablet 1  . Oxycodone HCl 10 MG TABS Take 1 tablet (10 mg total) by mouth every 6 (six) hours as needed. 60 tablet 0  . prochlorperazine (COMPAZINE) 10 MG tablet Take 1 tablet (10 mg total) by mouth every 6 (six) hours as needed (Nausea or vomiting). 30 tablet 1  . sucralfate (CARAFATE) 1 g tablet Take 1 tablet (1 g total) by mouth 3 (three) times daily. Dissolve in 2-3 tbsp warm water; swish and swallow. 90 tablet 3   No current facility-administered medications for this visit.    Facility-Administered Medications Ordered in Other Visits  Medication Dose Route Frequency Provider Last Rate Last Dose  . 0.9 %  sodium chloride infusion   Intravenous Once Lloyd Huger, MD      . CARBOplatin (PARAPLATIN) 250 mg in sodium chloride 0.9 % 100 mL chemo infusion  250 mg Intravenous Once Lloyd Huger, MD      . dexamethasone (DECADRON) 10 mg in sodium chloride 0.9 % 50 mL IVPB  10 mg Intravenous Once Lloyd Huger, MD      . diphenhydrAMINE (BENADRYL) injection 25 mg  25 mg Intravenous Once Lloyd Huger, MD      . famotidine (PEPCID) IVPB 20 mg premix  20 mg Intravenous Once Lloyd Huger, MD      . heparin lock flush 100 unit/mL  500 Units Intravenous Once Lloyd Huger, MD      . heparin lock flush 100 unit/mL  500 Units Intracatheter Once PRN Lloyd Huger, MD      . PACLitaxel (TAXOL) 90 mg in dextrose 5 % 250 mL chemo infusion (</= '80mg'$ /m2)  45 mg/m2 (Treatment Plan Recorded) Intravenous Once Lloyd Huger, MD      . palonosetron (ALOXI) injection 0.25 mg  0.25 mg Intravenous Once Lloyd Huger, MD      . sodium chloride flush (NS) 0.9 % injection 10 mL  10 mL Intravenous PRN Lloyd Huger, MD      . sodium chloride flush (NS) 0.9 % injection 10 mL  10 mL Intracatheter PRN Lloyd Huger, MD        OBJECTIVE: Vitals:   12/30/15 0919  BP: 119/77  Pulse: (!) 101  Temp: 97 F (36.1 C)     Body mass index is 21.04 kg/m.    ECOG FS:0 -  Asymptomatic  General: Well-developed, well-nourished,  mild respiratory  distress. Eyes: Pink conjunctiva, anicteric sclera. Lungs: Scattered wheezing throughout, improved. Heart: Regular rate and rhythm. No rubs, murmurs, or gallops. Abdomen: Soft, nontender, nondistended. No organomegaly noted, normoactive bowel sounds. Musculoskeletal: Left lower extremity edema. Neuro: Alert, answering all questions appropriately. Cranial nerves grossly intact. Skin: No rashes or petechiae noted. Psych: Normal affect.   LAB RESULTS:  Lab Results  Component Value Date   NA 134 (L) 12/30/2015   K 4.2 12/30/2015   CL 102 12/30/2015   CO2 28 12/30/2015   GLUCOSE 120 (H) 12/30/2015  BUN 12 12/30/2015   CREATININE 0.69 12/30/2015   CALCIUM 8.8 (L) 12/30/2015   PROT 7.3 12/30/2015   ALBUMIN 3.6 12/30/2015   AST 16 12/30/2015   ALT 13 (L) 12/30/2015   ALKPHOS 119 12/30/2015   BILITOT 0.4 12/30/2015   GFRNONAA >60 12/30/2015   GFRAA >60 12/30/2015    Lab Results  Component Value Date   WBC 5.1 12/30/2015   NEUTROABS 3.9 12/30/2015   HGB 11.4 (L) 12/30/2015   HCT 34.1 (L) 12/30/2015   MCV 78.7 (L) 12/30/2015   PLT 294 12/30/2015     STUDIES: No results found.  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  PLAN:    1. Stage III B poorly differentiated carcinoma of the right upper lobe lung:  MRI of brain negative for metastatic disease.  Pathology consistent with poorly differentiated carcinoma, but there is not enough tissue for additional molecular testing. Proceed with cycle 7 weekly carboplatinum and Taxol today. Continue daily XRT completing on January 04, 2016.  Return to clinic in 4 weeks for cycle 1 of 2 of consolidation chemotherapy.  2. Leukocytosis: Resolved. 3. Bilateral DVT: Patient had thrombectomy on November 11, 2015. Continue Eliquis as prescribed. Patient will require anticoagulation for 6 months given the extent of his  blood clot. 4. Pain: Continue oxycodone PRN. 5. Shortness of breath: Improved. Continue inhalers as prescribed. Patient states he is no longer using his home oxygen. He will likely need a pulmonary consult in the near future.   Patient expressed understanding and was in agreement with this plan. He also understands that He can call clinic at any time with any questions, concerns, or complaints.   Lung cancer [facility])   Staging form: Lung, AJCC 7th Edition     Clinical stage from 10/17/2015: Stage IIIB (T4, N2, M0) - Signed by Lloyd Huger, MD on 10/17/2015   Lloyd Huger, MD   12/30/2015 9:45 AM

## 2015-12-30 ENCOUNTER — Inpatient Hospital Stay: Payer: Medicaid Other

## 2015-12-30 ENCOUNTER — Ambulatory Visit
Admission: RE | Admit: 2015-12-30 | Discharge: 2015-12-30 | Disposition: A | Discharge status: Home / Self Care | Payer: Medicaid Other | Source: Ambulatory Visit | Attending: Radiation Oncology | Admitting: Radiation Oncology

## 2015-12-30 ENCOUNTER — Inpatient Hospital Stay (HOSPITAL_BASED_OUTPATIENT_CLINIC_OR_DEPARTMENT_OTHER): Payer: Medicaid Other | Admitting: Oncology

## 2015-12-30 VITALS — BP 119/77 | HR 101 | Temp 97.0°F | Ht 75.0 in | Wt 168.3 lb

## 2015-12-30 DIAGNOSIS — Z86718 Personal history of other venous thrombosis and embolism: Secondary | ICD-10-CM

## 2015-12-30 DIAGNOSIS — Z9221 Personal history of antineoplastic chemotherapy: Secondary | ICD-10-CM

## 2015-12-30 DIAGNOSIS — I499 Cardiac arrhythmia, unspecified: Secondary | ICD-10-CM

## 2015-12-30 DIAGNOSIS — I509 Heart failure, unspecified: Secondary | ICD-10-CM

## 2015-12-30 DIAGNOSIS — C3411 Malignant neoplasm of upper lobe, right bronchus or lung: Secondary | ICD-10-CM

## 2015-12-30 DIAGNOSIS — Z8669 Personal history of other diseases of the nervous system and sense organs: Secondary | ICD-10-CM

## 2015-12-30 DIAGNOSIS — Z5111 Encounter for antineoplastic chemotherapy: Secondary | ICD-10-CM | POA: Diagnosis not present

## 2015-12-30 DIAGNOSIS — Z923 Personal history of irradiation: Secondary | ICD-10-CM

## 2015-12-30 DIAGNOSIS — M129 Arthropathy, unspecified: Secondary | ICD-10-CM

## 2015-12-30 DIAGNOSIS — Z8701 Personal history of pneumonia (recurrent): Secondary | ICD-10-CM

## 2015-12-30 DIAGNOSIS — I4891 Unspecified atrial fibrillation: Secondary | ICD-10-CM

## 2015-12-30 DIAGNOSIS — N189 Chronic kidney disease, unspecified: Secondary | ICD-10-CM

## 2015-12-30 DIAGNOSIS — J449 Chronic obstructive pulmonary disease, unspecified: Secondary | ICD-10-CM

## 2015-12-30 DIAGNOSIS — I129 Hypertensive chronic kidney disease with stage 1 through stage 4 chronic kidney disease, or unspecified chronic kidney disease: Secondary | ICD-10-CM

## 2015-12-30 LAB — CBC WITH DIFFERENTIAL/PLATELET
Basophils Absolute: 0 10*3/uL (ref 0–0.1)
Basophils Relative: 1 %
Eosinophils Absolute: 0.2 10*3/uL (ref 0–0.7)
Eosinophils Relative: 3 %
HEMATOCRIT: 34.1 % — AB (ref 40.0–52.0)
HEMOGLOBIN: 11.4 g/dL — AB (ref 13.0–18.0)
LYMPHS ABS: 0.4 10*3/uL — AB (ref 1.0–3.6)
LYMPHS PCT: 8 %
MCH: 26.2 pg (ref 26.0–34.0)
MCHC: 33.3 g/dL (ref 32.0–36.0)
MCV: 78.7 fL — AB (ref 80.0–100.0)
MONO ABS: 0.6 10*3/uL (ref 0.2–1.0)
MONOS PCT: 12 %
NEUTROS ABS: 3.9 10*3/uL (ref 1.4–6.5)
Neutrophils Relative %: 76 %
Platelets: 294 10*3/uL (ref 150–440)
RBC: 4.33 MIL/uL — ABNORMAL LOW (ref 4.40–5.90)
RDW: 25.1 % — AB (ref 11.5–14.5)
WBC: 5.1 10*3/uL (ref 3.8–10.6)

## 2015-12-30 LAB — COMPREHENSIVE METABOLIC PANEL
ALK PHOS: 119 U/L (ref 38–126)
ALT: 13 U/L — ABNORMAL LOW (ref 17–63)
ANION GAP: 4 — AB (ref 5–15)
AST: 16 U/L (ref 15–41)
Albumin: 3.6 g/dL (ref 3.5–5.0)
BILIRUBIN TOTAL: 0.4 mg/dL (ref 0.3–1.2)
BUN: 12 mg/dL (ref 6–20)
CALCIUM: 8.8 mg/dL — AB (ref 8.9–10.3)
CO2: 28 mmol/L (ref 22–32)
Chloride: 102 mmol/L (ref 101–111)
Creatinine, Ser: 0.69 mg/dL (ref 0.61–1.24)
GFR calc Af Amer: >60 mL/min (ref >60)
Glucose, Bld: 120 mg/dL — ABNORMAL HIGH (ref 65–99)
POTASSIUM: 4.2 mmol/L (ref 3.5–5.1)
Sodium: 134 mmol/L — ABNORMAL LOW (ref 135–145)
TOTAL PROTEIN: 7.3 g/dL (ref 6.5–8.1)

## 2015-12-30 MED ORDER — SODIUM CHLORIDE 0.9 % IV SOLN
10.0000 mg | Freq: Once | INTRAVENOUS | Status: AC
  Administered 2015-12-30: 10 mg via INTRAVENOUS
  Filled 2015-12-30: qty 1

## 2015-12-30 MED ORDER — FAMOTIDINE IN NACL 20-0.9 MG/50ML-% IV SOLN
20.0000 mg | Freq: Once | INTRAVENOUS | Status: AC
  Administered 2015-12-30: 20 mg via INTRAVENOUS
  Filled 2015-12-30: qty 50

## 2015-12-30 MED ORDER — PALONOSETRON HCL INJECTION 0.25 MG/5ML
0.2500 mg | Freq: Once | INTRAVENOUS | Status: AC
  Administered 2015-12-30: 0.25 mg via INTRAVENOUS
  Filled 2015-12-30: qty 5

## 2015-12-30 MED ORDER — SODIUM CHLORIDE 0.9% FLUSH
10.0000 mL | INTRAVENOUS | Status: DC | PRN
  Administered 2015-12-30: 10 mL
  Filled 2015-12-30: qty 10

## 2015-12-30 MED ORDER — SODIUM CHLORIDE 0.9 % IV SOLN
Freq: Once | INTRAVENOUS | Status: AC
  Administered 2015-12-30: 10:00:00 via INTRAVENOUS
  Filled 2015-12-30: qty 1000

## 2015-12-30 MED ORDER — DIPHENHYDRAMINE HCL 50 MG/ML IJ SOLN
25.0000 mg | Freq: Once | INTRAMUSCULAR | Status: AC
  Administered 2015-12-30: 25 mg via INTRAVENOUS
  Filled 2015-12-30: qty 1

## 2015-12-30 MED ORDER — HEPARIN SOD (PORK) LOCK FLUSH 100 UNIT/ML IV SOLN
500.0000 [IU] | Freq: Once | INTRAVENOUS | Status: AC | PRN
  Administered 2015-12-30: 500 [IU]
  Filled 2015-12-30: qty 5

## 2015-12-30 MED ORDER — DEXTROSE 5 % IV SOLN
45.0000 mg/m2 | Freq: Once | INTRAVENOUS | Status: AC
  Administered 2015-12-30: 90 mg via INTRAVENOUS
  Filled 2015-12-30: qty 15

## 2015-12-30 MED ORDER — SODIUM CHLORIDE 0.9 % IV SOLN
250.8000 mg | Freq: Once | INTRAVENOUS | Status: AC
  Administered 2015-12-30: 250 mg via INTRAVENOUS
  Filled 2015-12-30: qty 25

## 2015-12-30 NOTE — Progress Notes (Signed)
Patient here for pretreatment check. No complaints today.  

## 2015-12-31 ENCOUNTER — Ambulatory Visit
Admission: RE | Admit: 2015-12-31 | Discharge: 2015-12-31 | Disposition: A | Discharge status: Home / Self Care | Payer: Medicaid Other | Source: Ambulatory Visit | Attending: Radiation Oncology | Admitting: Radiation Oncology

## 2015-12-31 DIAGNOSIS — Z5111 Encounter for antineoplastic chemotherapy: Secondary | ICD-10-CM | POA: Diagnosis not present

## 2016-01-01 ENCOUNTER — Ambulatory Visit
Admission: RE | Admit: 2016-01-01 | Discharge: 2016-01-01 | Disposition: A | Discharge status: Home / Self Care | Payer: Medicaid Other | Source: Ambulatory Visit | Attending: Radiation Oncology | Admitting: Radiation Oncology

## 2016-01-01 DIAGNOSIS — Z5111 Encounter for antineoplastic chemotherapy: Secondary | ICD-10-CM | POA: Diagnosis not present

## 2016-01-04 ENCOUNTER — Ambulatory Visit
Admission: RE | Admit: 2016-01-04 | Discharge: 2016-01-04 | Disposition: A | Discharge status: Home / Self Care | Payer: Medicaid Other | Source: Ambulatory Visit | Attending: Radiation Oncology | Admitting: Radiation Oncology

## 2016-01-04 DIAGNOSIS — Z5111 Encounter for antineoplastic chemotherapy: Secondary | ICD-10-CM | POA: Diagnosis not present

## 2016-01-25 NOTE — Progress Notes (Signed)
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  INTERVAL HISTORY: Patient returns to clinic today for further evaluation and consideration of cycle 1 of 2 of consolidation carboplatinum and Taxol. Over the past 4-5 days he is noted a profound weakness in his right hand where he has no grip strength. He otherwise feels well. His breathing has improved and he now no longer requires oxygen. His weakness and fatigue have improved. He has no neurologic complaints. He denies any fevers. He denies any chest pain or hemoptysis. He has a chronic cough that is unchanged.  He is eating and drinking well. He denies any nausea, vomiting, consultation, or diarrhea. He has no urinary complaints. Patient offers no further specific complaints today.  REVIEW OF SYSTEMS:   Review of Systems  Constitutional: Negative for fever, malaise/fatigue and weight loss.  Respiratory: Negative for cough, hemoptysis and shortness of breath.   Cardiovascular: Negative.  Negative for chest pain.  Gastrointestinal: Negative.  Negative for abdominal pain.  Genitourinary: Negative.   Musculoskeletal: Negative.  Negative for back pain.  Neurological: Positive for focal weakness. Negative for weakness.  Psychiatric/Behavioral: The patient is not nervous/anxious and does not have insomnia.     As per HPI. Otherwise, a complete review of systems is negative.  PAST MEDICAL HISTORY: Past Medical History:  Diagnosis Date  . Arthritis   . Atrial fibrillation ([place])   . Cancer ([place]) 2017   Lung  . CHF (congestive heart failure) ([place])   . Chronic kidney disease   . COPD (chronic obstructive  pulmonary disease) ([place])   . Disease of lung   . DVT (deep venous thrombosis) ([facility])    left leg  . Dysrhythmia   . Headache   . History of chemotherapy   . History of radiation therapy   . Hypertension   . Pneumonia     PAST SURGICAL HISTORY: Past Surgical History:  Procedure Laterality Date  . KNEE SURGERY Bilateral   . PERIPHERAL VASCULAR CATHETERIZATION Left 11/11/2015   Procedure: Thrombectomy;  Surgeon: Katha Cabal, MD;  Location: [facility];  Service: Cardiovascular;  Laterality: Left;  . PORTACATH PLACEMENT N/A 10/29/2015   Procedure: INSERTION PORT-A-CATH;  Surgeon: Nestor Lewandowsky, MD;  Location: [facility];  Service: General;  Laterality: N/A;  . TONSILLECTOMY      FAMILY HISTORY Family History  Problem Relation Age of Onset  . Diabetes Father   . Hypertension Father        ADVANCED DIRECTIVES:    HEALTH MAINTENANCE: Social History  Substance Use Topics  . Smoking status: Former Smoker    Packs/day: 2.00    Years: 50.00    Types: Cigarettes    Quit date: 08/07/2013  . Smokeless tobacco: Never Used  . Alcohol use 0.0 oz/week     Comment: rarely     Allergies  Allergen Reactions  . Aleve [Naproxen Sodium] Hives, Shortness Of Breath and Swelling    Naproxin    Current Outpatient Prescriptions  Medication Sig Dispense Refill  . albuterol (PROAIR HFA) 108 (90 Base) MCG/ACT inhaler Inhale 1-2 puffs into the lungs every 4 (four) hours as needed. 1 Inhaler 3  . apixaban (ELIQUIS) 5 MG TABS tablet Take 1  tablet (5 mg total) by mouth 2 (two) times daily. 180 tablet 3  . benzonatate (TESSALON) 200 MG capsule Take 1 capsule (200 mg total) by mouth 3 (three) times daily as needed for cough. Reported on 11/25/2015 30 capsule 0  . lidocaine-prilocaine (EMLA) cream Apply to affected area once 30 g 3  . Multiple Vitamin (MULTI-VITAMINS) TABS Take 1 tablet by mouth daily.     . ondansetron (ZOFRAN) 8 MG tablet Take 1 tablet (8 mg total) by mouth 2 (two) times  daily as needed for refractory nausea / vomiting. Start on day 3 after chemo. 30 tablet 1  . Oxycodone HCl 10 MG TABS Take 1 tablet (10 mg total) by mouth every 6 (six) hours as needed. 60 tablet 0  . prochlorperazine (COMPAZINE) 10 MG tablet Take 1 tablet (10 mg total) by mouth every 6 (six) hours as needed (Nausea or vomiting). 30 tablet 1   No current facility-administered medications for this visit.    Facility-Administered Medications Ordered in Other Visits  Medication Dose Route Frequency Provider Last Rate Last Dose  . CARBOplatin (PARAPLATIN) 600 mg in sodium chloride 0.9 % 250 mL chemo infusion  600 mg Intravenous Once Lloyd Huger, MD      . heparin lock flush 100 unit/mL  500 Units Intravenous Once Lloyd Huger, MD      . heparin lock flush 100 unit/mL  500 Units Intracatheter Once PRN Lloyd Huger, MD      . PACLitaxel (TAXOL) 354 mg in sodium chloride 0.9 % 500 mL chemo infusion (> 27m/m2)  175 mg/m2 (Treatment Plan Recorded) Intravenous Once TLloyd Huger MD 186 mL/hr at 01/27/16 1108 354 mg at 01/27/16 1108  . pegfilgrastim (NEULASTA ONPRO KIT) injection 6 mg  6 mg Subcutaneous Once TLloyd Huger MD      . sodium chloride flush (NS) 0.9 % injection 10 mL  10 mL Intravenous PRN TLloyd Huger MD      . sodium chloride flush (NS) 0.9 % injection 10 mL  10 mL Intracatheter PRN TLloyd Huger MD   10 mL at 01/27/16 0949    OBJECTIVE: Vitals:   01/27/16 0852  BP: (!) 168/114  Pulse: (!) 120  Resp: 18  Temp: 98 F (36.7 C)     Body mass index is 21.34 kg/m.    ECOG FS:0 - Asymptomatic  General: Well-developed, well-nourished,  mild respiratory  distress. Eyes: Pink conjunctiva, anicteric sclera. Lungs: Scattered wheezing throughout, improved. Heart: Regular rate and rhythm. No rubs, murmurs, or gallops. Abdomen: Soft, nontender, nondistended. No organomegaly noted, normoactive bowel sounds. Musculoskeletal: Left lower extremity  edema. Neuro: Alert, answering all questions appropriately. Cranial nerves grossly intact. 0-1 out of 5 strength of right grip and arm. Skin: No rashes or petechiae noted. Psych: Normal affect.   LAB RESULTS:  Lab Results  Component Value Date   NA 134 (L) 01/27/2016   K 3.8 01/27/2016   CL 99 (L) 01/27/2016   CO2 25 01/27/2016   GLUCOSE 175 (H) 01/27/2016   BUN 16 01/27/2016   CREATININE 0.87 01/27/2016   CALCIUM 9.4 01/27/2016   PROT 7.8 01/27/2016   ALBUMIN 3.5 01/27/2016   AST 17 01/27/2016   ALT 8 (L) 01/27/2016   ALKPHOS 102 01/27/2016   BILITOT 0.4 01/27/2016   GFRNONAA >60 01/27/2016   GFRAA >60 01/27/2016    Lab Results  Component Value Date   WBC 7.5 01/27/2016   NEUTROABS 6.4 01/27/2016   HGB  12.4 (L) 01/27/2016   HCT 35.9 (L) 01/27/2016   MCV 80.6 01/27/2016   PLT 310 01/27/2016     STUDIES: No results found.  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  PLAN:    1. Stage III B poorly differentiated carcinoma of the right upper lobe lung:  MRI of brain negative for metastatic disease.  Pathology consistent with poorly differentiated carcinoma, but there is not enough tissue for additional molecular testing. Proceed with cycle 1 of 2 of consolidation carboplatinum and Taxol today. He will also receive OnPro Neulasta for support. Patient completed XRT on January 04, 2016.  Return to clinic in 1 week for laboratory work and then in 3 weeks for consideration of cycle 2. Will reimage approximately 6-8 weeks at the conclusion of his consolidation chemotherapy.  2. Leukocytosis: Resolved. 3. Bilateral DVT: Patient had thrombectomy on November 11, 2015. Continue Eliquis as prescribed. Patient will require anticoagulation for 6 months given the extent of his blood clot. 4. Pain: Continue oxycodone PRN. 5. Shortness of breath: Improved. Continue inhalers as prescribed. Patient states he is no longer using his home  oxygen. He will likely need a pulmonary consult in the near future. 6. Right arm weakness: Unclear etiology. We will get a cervical spine MRI for further evaluation. Previously, MRI the brain was negative for metastatic disease, but will consider reimaging if no distinct etiology is determined.   Patient expressed understanding and was in agreement with this plan. He also understands that He can call clinic at any time with any questions, concerns, or complaints.   Lung cancer [facility])   Staging form: Lung, AJCC 7th Edition     Clinical stage from 10/17/2015: Stage IIIB (T4, N2, M0) - Signed by Lloyd Huger, MD on 10/17/2015   Lloyd Huger, MD   01/27/2016 11:52 AM

## 2016-01-27 ENCOUNTER — Inpatient Hospital Stay: Payer: Medicaid Other | Attending: Oncology

## 2016-01-27 ENCOUNTER — Inpatient Hospital Stay: Payer: Medicaid Other | Attending: Oncology | Admitting: Oncology

## 2016-01-27 ENCOUNTER — Inpatient Hospital Stay: Payer: Medicaid Other

## 2016-01-27 ENCOUNTER — Ambulatory Visit: Payer: Self-pay

## 2016-01-27 VITALS — BP 168/114 | HR 120 | Temp 98.0°F | Resp 18 | Wt 170.7 lb

## 2016-01-27 VITALS — BP 138/82 | HR 105 | Resp 18

## 2016-01-27 DIAGNOSIS — R531 Weakness: Secondary | ICD-10-CM | POA: Diagnosis not present

## 2016-01-27 DIAGNOSIS — R05 Cough: Secondary | ICD-10-CM | POA: Insufficient documentation

## 2016-01-27 DIAGNOSIS — C3411 Malignant neoplasm of upper lobe, right bronchus or lung: Secondary | ICD-10-CM

## 2016-01-27 DIAGNOSIS — Z7901 Long term (current) use of anticoagulants: Secondary | ICD-10-CM | POA: Diagnosis not present

## 2016-01-27 DIAGNOSIS — N189 Chronic kidney disease, unspecified: Secondary | ICD-10-CM | POA: Diagnosis not present

## 2016-01-27 DIAGNOSIS — I499 Cardiac arrhythmia, unspecified: Secondary | ICD-10-CM | POA: Diagnosis not present

## 2016-01-27 DIAGNOSIS — Z5111 Encounter for antineoplastic chemotherapy: Secondary | ICD-10-CM | POA: Diagnosis present

## 2016-01-27 DIAGNOSIS — Z923 Personal history of irradiation: Secondary | ICD-10-CM | POA: Diagnosis not present

## 2016-01-27 DIAGNOSIS — I4891 Unspecified atrial fibrillation: Secondary | ICD-10-CM

## 2016-01-27 DIAGNOSIS — Z9221 Personal history of antineoplastic chemotherapy: Secondary | ICD-10-CM | POA: Insufficient documentation

## 2016-01-27 DIAGNOSIS — Z87891 Personal history of nicotine dependence: Secondary | ICD-10-CM | POA: Insufficient documentation

## 2016-01-27 DIAGNOSIS — I129 Hypertensive chronic kidney disease with stage 1 through stage 4 chronic kidney disease, or unspecified chronic kidney disease: Secondary | ICD-10-CM | POA: Insufficient documentation

## 2016-01-27 DIAGNOSIS — M129 Arthropathy, unspecified: Secondary | ICD-10-CM | POA: Insufficient documentation

## 2016-01-27 DIAGNOSIS — R29898 Other symptoms and signs involving the musculoskeletal system: Secondary | ICD-10-CM

## 2016-01-27 DIAGNOSIS — I509 Heart failure, unspecified: Secondary | ICD-10-CM | POA: Diagnosis not present

## 2016-01-27 DIAGNOSIS — Z86718 Personal history of other venous thrombosis and embolism: Secondary | ICD-10-CM | POA: Insufficient documentation

## 2016-01-27 DIAGNOSIS — Z79899 Other long term (current) drug therapy: Secondary | ICD-10-CM | POA: Insufficient documentation

## 2016-01-27 DIAGNOSIS — Z7689 Persons encountering health services in other specified circumstances: Secondary | ICD-10-CM | POA: Diagnosis not present

## 2016-01-27 DIAGNOSIS — R5383 Other fatigue: Secondary | ICD-10-CM | POA: Insufficient documentation

## 2016-01-27 DIAGNOSIS — Z8701 Personal history of pneumonia (recurrent): Secondary | ICD-10-CM | POA: Insufficient documentation

## 2016-01-27 LAB — CBC WITH DIFFERENTIAL/PLATELET
BASOS ABS: 0 10*3/uL (ref 0–0.1)
BASOS PCT: 1 %
EOS ABS: 0.2 10*3/uL (ref 0–0.7)
EOS PCT: 3 %
HCT: 35.9 % — ABNORMAL LOW (ref 40.0–52.0)
HEMOGLOBIN: 12.4 g/dL — AB (ref 13.0–18.0)
LYMPHS ABS: 0.3 10*3/uL — AB (ref 1.0–3.6)
Lymphocytes Relative: 5 %
MCH: 27.7 pg (ref 26.0–34.0)
MCHC: 34.4 g/dL (ref 32.0–36.0)
MCV: 80.6 fL (ref 80.0–100.0)
Monocytes Absolute: 0.6 10*3/uL (ref 0.2–1.0)
Monocytes Relative: 8 %
NEUTROS PCT: 83 %
Neutro Abs: 6.4 10*3/uL (ref 1.4–6.5)
Platelets: 310 10*3/uL (ref 150–440)
RBC: 4.46 MIL/uL (ref 4.40–5.90)
RDW: 22.1 % — ABNORMAL HIGH (ref 11.5–14.5)
WBC: 7.5 10*3/uL (ref 3.8–10.6)

## 2016-01-27 LAB — COMPREHENSIVE METABOLIC PANEL
ALK PHOS: 102 U/L (ref 38–126)
ALT: 8 U/L — ABNORMAL LOW (ref 17–63)
ANION GAP: 10 (ref 5–15)
AST: 17 U/L (ref 15–41)
Albumin: 3.5 g/dL (ref 3.5–5.0)
BILIRUBIN TOTAL: 0.4 mg/dL (ref 0.3–1.2)
BUN: 16 mg/dL (ref 6–20)
CALCIUM: 9.4 mg/dL (ref 8.9–10.3)
CO2: 25 mmol/L (ref 22–32)
Chloride: 99 mmol/L — ABNORMAL LOW (ref 101–111)
Creatinine, Ser: 0.87 mg/dL (ref 0.61–1.24)
GFR calc non Af Amer: >60 mL/min (ref >60)
Glucose, Bld: 175 mg/dL — ABNORMAL HIGH (ref 65–99)
Potassium: 3.8 mmol/L (ref 3.5–5.1)
SODIUM: 134 mmol/L — AB (ref 135–145)
TOTAL PROTEIN: 7.8 g/dL (ref 6.5–8.1)

## 2016-01-27 MED ORDER — SODIUM CHLORIDE 0.9 % IV SOLN
175.0000 mg/m2 | Freq: Once | INTRAVENOUS | Status: AC
  Administered 2016-01-27: 354 mg via INTRAVENOUS
  Filled 2016-01-27: qty 59

## 2016-01-27 MED ORDER — HEPARIN SOD (PORK) LOCK FLUSH 100 UNIT/ML IV SOLN
500.0000 [IU] | Freq: Once | INTRAVENOUS | Status: AC | PRN
  Administered 2016-01-27: 500 [IU]
  Filled 2016-01-27: qty 5

## 2016-01-27 MED ORDER — PEGFILGRASTIM 6 MG/0.6ML ~~LOC~~ PSKT
6.0000 mg | PREFILLED_SYRINGE | Freq: Once | SUBCUTANEOUS | Status: AC
  Administered 2016-01-27: 6 mg via SUBCUTANEOUS
  Filled 2016-01-27: qty 0.6

## 2016-01-27 MED ORDER — SODIUM CHLORIDE 0.9% FLUSH
10.0000 mL | INTRAVENOUS | Status: DC | PRN
  Administered 2016-01-27: 10 mL
  Filled 2016-01-27: qty 10

## 2016-01-27 MED ORDER — SODIUM CHLORIDE 0.9 % IV SOLN
10.0000 mg | Freq: Once | INTRAVENOUS | Status: AC
  Administered 2016-01-27: 10 mg via INTRAVENOUS
  Filled 2016-01-27: qty 1

## 2016-01-27 MED ORDER — PALONOSETRON HCL INJECTION 0.25 MG/5ML
0.2500 mg | Freq: Once | INTRAVENOUS | Status: AC
  Administered 2016-01-27: 0.25 mg via INTRAVENOUS
  Filled 2016-01-27: qty 5

## 2016-01-27 MED ORDER — CARBOPLATIN CHEMO INJECTION 600 MG/60ML
595.0000 mg | Freq: Once | INTRAVENOUS | Status: AC
  Administered 2016-01-27: 600 mg via INTRAVENOUS
  Filled 2016-01-27: qty 60

## 2016-01-27 MED ORDER — SODIUM CHLORIDE 0.9 % IV SOLN
Freq: Once | INTRAVENOUS | Status: AC
  Administered 2016-01-27: 10:00:00 via INTRAVENOUS
  Filled 2016-01-27: qty 1000

## 2016-01-27 MED ORDER — FAMOTIDINE IN NACL 20-0.9 MG/50ML-% IV SOLN
20.0000 mg | Freq: Once | INTRAVENOUS | Status: AC
  Administered 2016-01-27: 20 mg via INTRAVENOUS
  Filled 2016-01-27: qty 50

## 2016-01-27 MED ORDER — DIPHENHYDRAMINE HCL 50 MG/ML IJ SOLN
25.0000 mg | Freq: Once | INTRAMUSCULAR | Status: AC
  Administered 2016-01-27: 25 mg via INTRAVENOUS
  Filled 2016-01-27: qty 1

## 2016-01-27 NOTE — Progress Notes (Signed)
States is having numbness and weakness in right hand.

## 2016-02-03 ENCOUNTER — Ambulatory Visit: Payer: Self-pay | Admitting: Radiation Oncology

## 2016-02-04 ENCOUNTER — Ambulatory Visit
Admission: RE | Admit: 2016-02-04 | Discharge: 2016-02-04 | Disposition: A | Discharge status: Home / Self Care | Payer: Medicaid Other | Source: Ambulatory Visit | Attending: Radiation Oncology | Admitting: Radiation Oncology

## 2016-02-04 ENCOUNTER — Encounter: Payer: Self-pay | Admitting: Radiation Oncology

## 2016-02-04 ENCOUNTER — Inpatient Hospital Stay: Payer: Medicaid Other

## 2016-02-04 VITALS — BP 156/103 | HR 128 | Temp 96.9°F | Resp 20 | Wt 164.9 lb

## 2016-02-04 DIAGNOSIS — Z5111 Encounter for antineoplastic chemotherapy: Secondary | ICD-10-CM | POA: Diagnosis present

## 2016-02-04 DIAGNOSIS — Z86718 Personal history of other venous thrombosis and embolism: Secondary | ICD-10-CM | POA: Diagnosis not present

## 2016-02-04 DIAGNOSIS — C3411 Malignant neoplasm of upper lobe, right bronchus or lung: Secondary | ICD-10-CM | POA: Insufficient documentation

## 2016-02-04 DIAGNOSIS — R5383 Other fatigue: Secondary | ICD-10-CM | POA: Diagnosis not present

## 2016-02-04 LAB — COMPREHENSIVE METABOLIC PANEL
ALK PHOS: 136 U/L — AB (ref 38–126)
ALT: 25 U/L (ref 17–63)
AST: 27 U/L (ref 15–41)
Albumin: 3.5 g/dL (ref 3.5–5.0)
Anion gap: 11 (ref 5–15)
BILIRUBIN TOTAL: 0.4 mg/dL (ref 0.3–1.2)
BUN: 19 mg/dL (ref 6–20)
CALCIUM: 9.5 mg/dL (ref 8.9–10.3)
CO2: 27 mmol/L (ref 22–32)
CREATININE: 1.14 mg/dL (ref 0.61–1.24)
Chloride: 93 mmol/L — ABNORMAL LOW (ref 101–111)
GFR calc Af Amer: >60 mL/min (ref >60)
Glucose, Bld: 201 mg/dL — ABNORMAL HIGH (ref 65–99)
POTASSIUM: 3.7 mmol/L (ref 3.5–5.1)
Sodium: 131 mmol/L — ABNORMAL LOW (ref 135–145)
TOTAL PROTEIN: 7.9 g/dL (ref 6.5–8.1)

## 2016-02-04 LAB — CBC WITH DIFFERENTIAL/PLATELET
BASOS ABS: 0.1 10*3/uL (ref 0–0.1)
Basophils Relative: 0 %
Eosinophils Absolute: 0.2 10*3/uL (ref 0–0.7)
Eosinophils Relative: 1 %
HEMATOCRIT: 36.4 % — AB (ref 40.0–52.0)
Hemoglobin: 12.5 g/dL — ABNORMAL LOW (ref 13.0–18.0)
LYMPHS PCT: 2 %
Lymphs Abs: 0.4 10*3/uL — ABNORMAL LOW (ref 1.0–3.6)
MCH: 27.6 pg (ref 26.0–34.0)
MCHC: 34.3 g/dL (ref 32.0–36.0)
MCV: 80.4 fL (ref 80.0–100.0)
MONO ABS: 1.2 10*3/uL — AB (ref 0.2–1.0)
Monocytes Relative: 6 %
NEUTROS ABS: 19.2 10*3/uL — AB (ref 1.4–6.5)
Neutrophils Relative %: 91 %
Platelets: 257 10*3/uL (ref 150–440)
RBC: 4.53 MIL/uL (ref 4.40–5.90)
RDW: 21.1 % — AB (ref 11.5–14.5)
WBC: 21.1 10*3/uL — ABNORMAL HIGH (ref 3.8–10.6)

## 2016-02-04 NOTE — Progress Notes (Signed)
Radiation Oncology Follow up Note  Name: Sean Hall   Date:   02/04/2016 MRN:  [id number] DOB: 14-Apr-1951    This 65 y.o. male presents to the clinic today for one-month follow-up status post chemoradiation for poorly differentia carcinoma the right upper lobe. Stage IIIB  REFERRING PROVIDER: Nestor Lewandowsky, MD  HPI: Patient is a 65 year old male now out 1 month having completed combined modality treatment for a poorly differentiated carcinoma of the right upper lobe. He is seen today in routine follow-up and is doing fair. States she's quite fatigued most likely related to his chemotherapy.Marland Kitchen He is currently on consolidative chemotherapy with carboplatinum and Taxol. Patient over the summer had bilateral DVTs. He specifically denies any focal neurologic deficits. Headache or bone pain cough or hemoptysis.  COMPLICATIONS OF TREATMENT: none  FOLLOW UP COMPLIANCE: keeps appointments PHYSICAL EXAM:  BP (!) 156/103   Pulse (!) 128   Temp (!) 96.9 F (36.1 C)   Resp 20   Wt 164 lb 14.5 oz (74.8 kg)   SpO2 96%   BMI 20.61 kg/m  Well-developed well-nourished patient in NAD. HEENT reveals PERLA, EOMI, discs not visualized.  Oral cavity is clear. No oral mucosal lesions are identified. Neck is clear without evidence of cervical or supraclavicular adenopathy. Lungs are clear to A&P. Cardiac examination is essentially unremarkable with regular rate and rhythm without murmur rub or thrill. Abdomen is benign with no organomegaly or masses noted. Motor sensory and DTR levels are equal and symmetric in the upper and lower extremities. Cranial nerves II through XII are grossly intact. Proprioception is intact. No peripheral adenopathy or edema is identified. No motor or sensory levels are noted. Crude visual fields are within normal range.  RADIOLOGY RESULTS: No current films for review  PLAN: Present time he is recovering slowly can currently on consolidative chemotherapy. He's been worked up by  medical oncology for some pain in his upper extremities with a cervical MRI scheduled. Will review those when they become available should indication for palliative radiation therapy be indicated. Otherwise I'm please was overall progress. I've asked to see him back in 4-5 months for follow-up. He knows to call sooner with any concerns.  I would like to take this opportunity to thank you for allowing me to participate in the care of your patient.Armstead Peaks., MD

## 2016-02-05 ENCOUNTER — Ambulatory Visit: Payer: Medicaid Other

## 2016-02-05 ENCOUNTER — Telehealth: Payer: Self-pay | Admitting: Oncology

## 2016-02-05 ENCOUNTER — Ambulatory Visit: Payer: MEDICAID

## 2016-02-05 NOTE — Telephone Encounter (Signed)
Pt was taking old prescription of vicodin for pain but has a full bottle of oxycodone at home already. Instructed pt's wife that he will need to try taking oxycodone '10mg'$  every 6 hours for pain than may increase to every 4 hours if needed for pain control. pts wife verbalized understanding.

## 2016-02-05 NOTE — Telephone Encounter (Signed)
Thank you :)

## 2016-02-05 NOTE — Telephone Encounter (Signed)
Sean Hall- can you check to see how he is taking his oxycodone and where his pain actually is?  He can increase it to every 4hrs if needed.

## 2016-02-05 NOTE — Telephone Encounter (Signed)
He went today for his MRI, was not aware it had been r/s due to insurance pending. Technician called to inform us that he was really in a lot of pain and he wanted her to pass the word along to Dr. Grayland Ormond. Please call patient to advise. Thank you.

## 2016-02-06 ENCOUNTER — Emergency Department: Payer: Medicaid Other

## 2016-02-06 ENCOUNTER — Encounter: Payer: Self-pay | Admitting: Emergency Medicine

## 2016-02-06 ENCOUNTER — Inpatient Hospital Stay
Admission: EM | Admit: 2016-02-06 | Discharge: 2016-02-08 | DRG: 871 | Disposition: A | Discharge status: Home / Self Care | Payer: Medicaid Other | Attending: Internal Medicine | Admitting: Internal Medicine

## 2016-02-06 DIAGNOSIS — Z886 Allergy status to analgesic agent status: Secondary | ICD-10-CM

## 2016-02-06 DIAGNOSIS — J440 Chronic obstructive pulmonary disease with acute lower respiratory infection: Secondary | ICD-10-CM | POA: Diagnosis present

## 2016-02-06 DIAGNOSIS — I8001 Phlebitis and thrombophlebitis of superficial vessels of right lower extremity: Secondary | ICD-10-CM | POA: Diagnosis present

## 2016-02-06 DIAGNOSIS — Z95828 Presence of other vascular implants and grafts: Secondary | ICD-10-CM | POA: Diagnosis not present

## 2016-02-06 DIAGNOSIS — A419 Sepsis, unspecified organism: Secondary | ICD-10-CM | POA: Diagnosis not present

## 2016-02-06 DIAGNOSIS — C3411 Malignant neoplasm of upper lobe, right bronchus or lung: Secondary | ICD-10-CM | POA: Diagnosis present

## 2016-02-06 DIAGNOSIS — J189 Pneumonia, unspecified organism: Secondary | ICD-10-CM | POA: Diagnosis present

## 2016-02-06 DIAGNOSIS — Z8249 Family history of ischemic heart disease and other diseases of the circulatory system: Secondary | ICD-10-CM

## 2016-02-06 DIAGNOSIS — Z87891 Personal history of nicotine dependence: Secondary | ICD-10-CM | POA: Diagnosis not present

## 2016-02-06 DIAGNOSIS — M79604 Pain in right leg: Secondary | ICD-10-CM | POA: Diagnosis present

## 2016-02-06 DIAGNOSIS — I82512 Chronic embolism and thrombosis of left femoral vein: Secondary | ICD-10-CM | POA: Diagnosis present

## 2016-02-06 DIAGNOSIS — R030 Elevated blood-pressure reading, without diagnosis of hypertension: Secondary | ICD-10-CM | POA: Diagnosis present

## 2016-02-06 DIAGNOSIS — N182 Chronic kidney disease, stage 2 (mild): Secondary | ICD-10-CM | POA: Diagnosis present

## 2016-02-06 DIAGNOSIS — Z86718 Personal history of other venous thrombosis and embolism: Secondary | ICD-10-CM

## 2016-02-06 DIAGNOSIS — Z7901 Long term (current) use of anticoagulants: Secondary | ICD-10-CM | POA: Diagnosis not present

## 2016-02-06 DIAGNOSIS — I809 Phlebitis and thrombophlebitis of unspecified site: Secondary | ICD-10-CM

## 2016-02-06 LAB — COMPREHENSIVE METABOLIC PANEL
ALBUMIN: 3.2 g/dL — AB (ref 3.5–5.0)
ALK PHOS: 137 U/L — AB (ref 38–126)
ALT: 16 U/L — ABNORMAL LOW (ref 17–63)
ANION GAP: 11 (ref 5–15)
AST: 19 U/L (ref 15–41)
BUN: 12 mg/dL (ref 6–20)
CALCIUM: 9 mg/dL (ref 8.9–10.3)
CHLORIDE: 95 mmol/L — AB (ref 101–111)
CO2: 25 mmol/L (ref 22–32)
Creatinine, Ser: 1.07 mg/dL (ref 0.61–1.24)
GFR calc non Af Amer: >60 mL/min (ref >60)
GLUCOSE: 205 mg/dL — AB (ref 65–99)
POTASSIUM: 3 mmol/L — AB (ref 3.5–5.1)
SODIUM: 131 mmol/L — AB (ref 135–145)
Total Bilirubin: 0.4 mg/dL (ref 0.3–1.2)
Total Protein: 7.5 g/dL (ref 6.5–8.1)

## 2016-02-06 LAB — URINALYSIS COMPLETE WITH MICROSCOPIC (ARMC ONLY)
BACTERIA UA: NONE SEEN
Bilirubin Urine: NEGATIVE
Glucose, UA: NEGATIVE mg/dL
Ketones, ur: NEGATIVE mg/dL
LEUKOCYTES UA: NEGATIVE
NITRITE: NEGATIVE
PH: 7 (ref 5.0–8.0)
PROTEIN: NEGATIVE mg/dL
SQUAMOUS EPITHELIAL / LPF: NONE SEEN
Specific Gravity, Urine: 1.003 — ABNORMAL LOW (ref 1.005–1.030)

## 2016-02-06 LAB — CBC WITH DIFFERENTIAL/PLATELET
BASOS ABS: 0 10*3/uL (ref 0–0.1)
Basophils Relative: 0 %
EOS ABS: 0.2 10*3/uL (ref 0–0.7)
Eosinophils Relative: 1 %
HCT: 36.4 % — ABNORMAL LOW (ref 40.0–52.0)
HEMOGLOBIN: 12 g/dL — AB (ref 13.0–18.0)
LYMPHS PCT: 2 %
Lymphs Abs: 0.5 10*3/uL — ABNORMAL LOW (ref 1.0–3.6)
MCH: 27 pg (ref 26.0–34.0)
MCHC: 33 g/dL (ref 32.0–36.0)
MCV: 81.8 fL (ref 80.0–100.0)
MONO ABS: 0.7 10*3/uL (ref 0.2–1.0)
Monocytes Relative: 3 %
NEUTROS ABS: 22.6 10*3/uL — AB (ref 1.4–6.5)
NEUTROS PCT: 94 %
PLATELETS: 217 10*3/uL (ref 150–440)
RBC: 4.45 MIL/uL (ref 4.40–5.90)
RDW: 21.3 % — ABNORMAL HIGH (ref 11.5–14.5)
WBC: 24 10*3/uL — ABNORMAL HIGH (ref 3.8–10.6)

## 2016-02-06 LAB — LACTIC ACID, PLASMA: LACTIC ACID, VENOUS: 1.6 mmol/L (ref 0.5–1.9)

## 2016-02-06 LAB — MAGNESIUM: Magnesium: 1.6 mg/dL — ABNORMAL LOW (ref 1.7–2.4)

## 2016-02-06 MED ORDER — DEXTROSE 5 % IV SOLN
1.0000 g | Freq: Two times a day (BID) | INTRAVENOUS | Status: DC
  Administered 2016-02-06 – 2016-02-08 (×4): 1 g via INTRAVENOUS
  Filled 2016-02-06 (×6): qty 1

## 2016-02-06 MED ORDER — APIXABAN 5 MG PO TABS
5.0000 mg | ORAL_TABLET | Freq: Two times a day (BID) | ORAL | Status: DC
  Administered 2016-02-06 – 2016-02-08 (×4): 5 mg via ORAL
  Filled 2016-02-06 (×4): qty 1

## 2016-02-06 MED ORDER — SODIUM CHLORIDE 0.9 % IV SOLN
1000.0000 mL | Freq: Once | INTRAVENOUS | Status: AC
  Administered 2016-02-06: 1000 mL via INTRAVENOUS

## 2016-02-06 MED ORDER — ONDANSETRON HCL 4 MG/2ML IJ SOLN: 4.0000 mg | Freq: Four times a day (QID) | INTRAMUSCULAR | Status: DC | PRN

## 2016-02-06 MED ORDER — OXYCODONE HCL 5 MG PO TABS
10.0000 mg | ORAL_TABLET | Freq: Four times a day (QID) | ORAL | Status: DC | PRN
  Administered 2016-02-06 – 2016-02-07 (×2): 10 mg via ORAL
  Filled 2016-02-06 (×2): qty 2

## 2016-02-06 MED ORDER — PROCHLORPERAZINE MALEATE 10 MG PO TABS
10.0000 mg | ORAL_TABLET | Freq: Four times a day (QID) | ORAL | Status: DC | PRN
  Filled 2016-02-06: qty 1

## 2016-02-06 MED ORDER — SODIUM CHLORIDE 0.9% FLUSH: 3.0000 mL | Freq: Two times a day (BID) | INTRAVENOUS | Status: DC

## 2016-02-06 MED ORDER — ADULT MULTIVITAMIN W/MINERALS CH
1.0000 | ORAL_TABLET | Freq: Every day | ORAL | Status: DC
  Administered 2016-02-06 – 2016-02-08 (×3): 1 via ORAL
  Filled 2016-02-06 (×3): qty 1

## 2016-02-06 MED ORDER — ACETAMINOPHEN 650 MG RE SUPP: 650.0000 mg | Freq: Four times a day (QID) | RECTAL | Status: DC | PRN

## 2016-02-06 MED ORDER — POTASSIUM CHLORIDE CRYS ER 20 MEQ PO TBCR
40.0000 meq | EXTENDED_RELEASE_TABLET | Freq: Once | ORAL | Status: AC
  Administered 2016-02-06: 40 meq via ORAL
  Filled 2016-02-06: qty 2

## 2016-02-06 MED ORDER — HYDRALAZINE HCL 20 MG/ML IJ SOLN: 10.0000 mg | Freq: Four times a day (QID) | INTRAMUSCULAR | Status: DC | PRN

## 2016-02-06 MED ORDER — ONDANSETRON HCL 4 MG PO TABS
4.0000 mg | ORAL_TABLET | Freq: Four times a day (QID) | ORAL | Status: DC | PRN
  Administered 2016-02-07: 4 mg via ORAL
  Filled 2016-02-06: qty 1

## 2016-02-06 MED ORDER — ACETAMINOPHEN 325 MG PO TABS
650.0000 mg | ORAL_TABLET | Freq: Four times a day (QID) | ORAL | Status: DC | PRN
  Administered 2016-02-08: 08:00:00 650 mg via ORAL
  Filled 2016-02-06: qty 2

## 2016-02-06 MED ORDER — SODIUM CHLORIDE 0.9 % IV SOLN
INTRAVENOUS | Status: DC
  Administered 2016-02-06 – 2016-02-07 (×3): via INTRAVENOUS

## 2016-02-06 MED ORDER — LEVOFLOXACIN IN D5W 750 MG/150ML IV SOLN: 750.0000 mg | Freq: Once | INTRAVENOUS | Status: DC

## 2016-02-06 MED ORDER — SENNOSIDES-DOCUSATE SODIUM 8.6-50 MG PO TABS: 1.0000 | ORAL_TABLET | Freq: Every evening | ORAL | Status: DC | PRN

## 2016-02-06 MED ORDER — ONDANSETRON HCL 4 MG PO TABS: 8.0000 mg | ORAL_TABLET | Freq: Two times a day (BID) | ORAL | Status: DC | PRN

## 2016-02-06 MED ORDER — BENZONATATE 100 MG PO CAPS: 200.0000 mg | ORAL_CAPSULE | Freq: Three times a day (TID) | ORAL | Status: DC | PRN

## 2016-02-06 NOTE — Progress Notes (Signed)
See initial nursing assessment. Has reddened tender area on mid inner lower right leg. Ordered meds given as ordered. IVF's started. Eating dinner and tolerating well. No respiratory issues at this time. BP stable.

## 2016-02-06 NOTE — ED Notes (Signed)
Report given to floor RN. Pt taken to floor via stretcher. Vital signs stable prior to transport.  

## 2016-02-06 NOTE — ED Triage Notes (Signed)
On Eliquis

## 2016-02-06 NOTE — ED Notes (Signed)
Pt given sandwich tray and sprite to drink.

## 2016-02-06 NOTE — H&P (Addendum)
[facility] [place] NAME: Sean Hall    MR#:  [id number]  DATE OF BIRTH:  1951-02-12  DATE OF ADMISSION:  02/06/2016  PRIMARY CARE PHYSICIAN: Dr Grayland Ormond   REQUESTING/REFERRING PHYSICIAN: Dr Corky Downs CHIEF COMPLAINT:   Right leg pain HISTORY OF PRESENT ILLNESS:  Sean Hall  is a 65 y.o. male with a known history of stage IIIB poorly differentiated carcinoma of the lung, right upper lobeAnd DVT on anticoagulation/IVC filter who presents with right leg pain and weakness. His last chemotherapy was on September 20. Since that time he also reports a cough and generalized weakness. The right leg pain started 2 days ago. He called Dr. Ronnell Freshwater this morning who recommended patient to come to the ER for further evaluation. Doppler does not show evidence of DVT in the right lower extremity however there is a superficial occlusive thrombus in the right greater saphenous vein. There is also a partially occlusive DVT in the left common femoral vein which has been there since July. He received Neulasta on September 20 along with his chemotherapy. In the emergency room white blood cell count was elevated at 24.1 and chest x-ray suggestive of right upper lobe pneumonia. Dr. Grayland Ormond was called from the emergency room by the ER physician who recommended patient to be hospitalized for pneumonia. He did not feel that Neulasta would increase white blood cell count this high.   PAST MEDICAL HISTORY:   Past Medical History:  Diagnosis Date  . Arthritis   . Atrial fibrillation ([place])   . Cancer ([place]) 2017   Lung  . CHF (congestive heart failure) ([place])   . Chronic kidney disease   . COPD (chronic obstructive pulmonary disease) ([place])   . Disease of lung   . DVT (deep venous thrombosis) ([facility])    left leg  . Dysrhythmia   . Headache   . History of chemotherapy   . History of radiation therapy   . Hypertension   . Pneumonia     PAST SURGICAL HISTORY:    Past Surgical History:  Procedure Laterality Date  . KNEE SURGERY Bilateral   . PERIPHERAL VASCULAR CATHETERIZATION Left 11/11/2015   Procedure: Thrombectomy;  Surgeon: Katha Cabal, MD;  Location: [facility];  Service: Cardiovascular;  Laterality: Left;  . PORTACATH PLACEMENT N/A 10/29/2015   Procedure: INSERTION PORT-A-CATH;  Surgeon: Nestor Lewandowsky, MD;  Location: [facility];  Service: General;  Laterality: N/A;  . TONSILLECTOMY      SOCIAL HISTORY:   Social History  Substance Use Topics  . Smoking status: Former Smoker    Packs/day: 2.00    Years: 50.00    Types: Cigarettes    Quit date: 08/07/2013  . Smokeless tobacco: Never Used  . Alcohol use 0.0 oz/week     Comment: rarely    FAMILY HISTORY:   Family History  Problem Relation Age of Onset  . Diabetes Father   . Hypertension Father     DRUG ALLERGIES:   Allergies  Allergen Reactions  . Aleve [Naproxen Sodium] Hives, Shortness Of Breath and Swelling    Naproxin    REVIEW OF SYSTEMS:   Review of Systems  Constitutional: Positive for malaise/fatigue. Negative for chills and fever.  HENT: Negative.  Negative for ear discharge, ear pain, hearing loss, nosebleeds and sore throat.   Eyes: Negative.  Negative for blurred vision and pain.  Respiratory: Positive for cough. Negative for hemoptysis, shortness of breath and wheezing.   Cardiovascular:  Negative.  Negative for chest pain, palpitations and leg swelling.  Gastrointestinal: Negative.  Negative for abdominal pain, blood in stool, diarrhea, nausea and vomiting.  Genitourinary: Negative.  Negative for dysuria.  Musculoskeletal: Negative.  Negative for back pain.       Right leg pain  Skin: Negative.   Neurological: Positive for weakness. Negative for dizziness, tremors, speech change, focal weakness, seizures and headaches.  Endo/Heme/Allergies: Negative.  Does not bruise/bleed easily.  Psychiatric/Behavioral: Negative.  Negative for depression,  hallucinations and suicidal ideas.    MEDICATIONS AT HOME:   Prior to Admission medications   Medication Sig Start Date End Date Taking? Authorizing Provider  albuterol (PROAIR HFA) 108 (90 Base) MCG/ACT inhaler Inhale 1-2 puffs into the lungs every 4 (four) hours as needed. 11/18/15   Lloyd Huger, MD  apixaban (ELIQUIS) 5 MG TABS tablet Take 1 tablet (5 mg total) by mouth 2 (two) times daily. 11/30/15   Lloyd Huger, MD  benzonatate (TESSALON) 200 MG capsule Take 1 capsule (200 mg total) by mouth 3 (three) times daily as needed for cough. Reported on 11/25/2015 12/09/15   Lloyd Huger, MD  lidocaine-prilocaine (EMLA) cream Apply to affected area once 10/28/15   Lloyd Huger, MD  Multiple Vitamin (MULTI-VITAMINS) TABS Take 1 tablet by mouth daily.     Historical Provider, MD  ondansetron (ZOFRAN) 8 MG tablet Take 1 tablet (8 mg total) by mouth 2 (two) times daily as needed for refractory nausea / vomiting. Start on day 3 after chemo. 12/09/15   Lloyd Huger, MD  Oxycodone HCl 10 MG TABS Take 1 tablet (10 mg total) by mouth every 6 (six) hours as needed. 11/18/15   Lloyd Huger, MD  prochlorperazine (COMPAZINE) 10 MG tablet Take 1 tablet (10 mg total) by mouth every 6 (six) hours as needed (Nausea or vomiting). 10/20/15   Lloyd Huger, MD      VITAL SIGNS:  Blood pressure (!) 182/119, pulse (!) 56, temperature 97.6 F (36.4 C), temperature source Oral, resp. rate 18, height '6\' 3"'$  (1.905 m), weight 74.4 kg (164 lb), SpO2 97 %.  PHYSICAL EXAMINATION:   Physical Exam  Constitutional: He is oriented to person, place, and time and well-developed, well-nourished, and in no distress. No distress.  HENT:  Head: Normocephalic.  Eyes: No scleral icterus.  Neck: Normal range of motion. Neck supple. No JVD present. No tracheal deviation present.  Cardiovascular: Normal rate, regular rhythm and normal heart sounds.  Exam reveals no gallop and no friction rub.   No  murmur heard. Pulmonary/Chest: Effort normal and breath sounds normal. No respiratory distress. He has no wheezes. He has no rales. He exhibits no tenderness.  Occasional right upper lobe wheeze  Abdominal: Soft. Bowel sounds are normal. He exhibits no distension and no mass. There is no tenderness. There is no rebound and no guarding.  Musculoskeletal: Normal range of motion. He exhibits no edema.  Neurological: He is alert and oriented to person, place, and time.  Skin: Skin is warm. No rash noted. No pallor.  Right lower extremity with erythema and very tender at the saphenous vein  Psychiatric: Affect and judgment normal.      LABORATORY PANEL:   CBC  Recent Labs Lab 02/06/16 1339  WBC 24.0*  HGB 12.0*  HCT 36.4*  PLT 217   ------------------------------------------------------------------------------------------------------------------  Chemistries   Recent Labs Lab 02/06/16 1339  NA 131*  K 3.0*  CL 95*  CO2 25  GLUCOSE  205*  BUN 12  CREATININE 1.07  CALCIUM 9.0  AST 19  ALT 16*  ALKPHOS 137*  BILITOT 0.4   ------------------------------------------------------------------------------------------------------------------  Cardiac Enzymes No results for input(s): TROPONINI in the last 168 hours. ------------------------------------------------------------------------------------------------------------------  RADIOLOGY:  Dg Chest 2 View  Result Date: 02/06/2016 CLINICAL DATA:  Redness and swelling in the right lower extremity. Known lung cancer in the right upper lobe. EXAM: CHEST  2 VIEW COMPARISON:  November 04, 2015 FINDINGS: There was dense consolidation in the right upper lobe on the previous study which has improved. There is now opacity throughout the right central lung, not seen previously. Scarring in the left apex. The left Port-A-Cath is stable. No pneumothorax. No change in the cardiomediastinal silhouette. No other interval changes. IMPRESSION: The  dense consolidation in the right upper lobe is improved consistent with interval treatment. There is patchy opacity throughout the right central lung. Whether this is pneumonia or post treatment change is unclear on this single study. Recommend clinical correlation and follow-up to resolution. Electronically Signed   By: Dorise Bullion III M.D   On: 02/06/2016 15:48   US Venous Img Lower Unilateral Right  Result Date: 02/06/2016 CLINICAL DATA:  Air edema and pain for 1 day in the right lower extremity EXAM: RIGHT LOWER EXTREMITY VENOUS DUPLEX ULTRASOUND TECHNIQUE: Doppler venous assessment of the left lower extremity deep venous system was performed, including characterization of spectral flow, compressibility, and phasicity. COMPARISON:  None. FINDINGS: There is complete compressibility of the right common femoral, femoral, and popliteal veins. Doppler analysis demonstrates respiratory phasicity and augmentation of flow with calf compression. No obvious calf vein thrombosis. There is occlusive thrombus in the right great saphenous vein beginning in the distal thigh to the distal calf. Incidental imaging of the left common femoral vein demonstrates nonocclusive thrombus within the left common femoral vein. IMPRESSION: There is no evidence of DVT in the right lower extremity There is superficial occlusive thrombus in the right greater saphenous vein as described There is partially occlusive DVT in the left common femoral vein. Compared to the prior study dated 11/07/2015, this has slightly improved. Electronically Signed   By: Marybelle Killings M.D.   On: 02/06/2016 14:53    EKG:   Sinus tachycardia no ST elevation or depression  IMPRESSION AND PLAN:   65 year old male with a history of lung cancer currently being treated with chemoradiation who initially presented with right lower extremity pain due to thrombophlebitis and found to have sepsis due to pneumonia.  1. Sepsis: Patient presents with  leukocytosis and tachycardia. He most likely does not mount a fever due to immunosuppressed state. Sepsis due to pneumonia seen on chest x-ray and history of cough for the past 1 week. Start cefepime and follow up on blood cultures. Continue IV fluids.  2. Pneumonia and immunosuppressed patient currently receiving chemoradiation: Start cefepime and follow up on blood cultures.  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 Dr. Ronnell Freshwater did not think that the elevation in white blood cell count was primarily due to Neupogen that was given on September 20.  4. Elevated blood pressure without diagnosis of hypertension: Hydralazine when necessary ordered. 5. History DVT: Continue anticoagulation with Eliquis. Warm compress for superficial thrombophlebitis.     All the records are reviewed and case discussed with ED provider. Management plans discussed with the patient and he in agreement  CODE STATUS: full  TOTAL TIME  TAKING CARE OF THIS PATIENT: 45 minutes.    Azelyn Batie M.D on 02/06/2016 at 5:00 PM  Between 7am to 6pm - Pager - [phone]  After 6pm go to [email or web address] - password [email or web address] [facility]  Office  [phone]  CC: Primary care physician; No PCP Per Patient

## 2016-02-06 NOTE — ED Provider Notes (Signed)
[facility] Emergency Department Provider Note   ____________________________________________    I have reviewed the triage vital signs and the nursing notes.   HISTORY  Chief Complaint DVT     HPI Sean Hall is a 65 y.o. male on eliquis with a history of DVTs who presents with pain and redness in his right leg. he also reports feeling somewhat weak. 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 He is being treated for lung CA by Dr. Grayland Ormond, last chemotherapy 1.5 weeks ago. Denies fever, intermittent cough reported. He denies chest pain at this time.   Past Medical History:  Diagnosis Date  . Arthritis   . Atrial fibrillation ([place])   . Cancer ([place]) 2017   Lung  . CHF (congestive heart failure) ([place])   . Chronic kidney disease   . COPD (chronic obstructive pulmonary disease) ([place])   . Disease of lung   . DVT (deep venous thrombosis) ([facility])    left leg  . Dysrhythmia   . Headache   . History of chemotherapy   . History of radiation therapy   . Hypertension   . Pneumonia     Patient Active Problem List   Diagnosis Date Noted  . DVT (deep venous thrombosis), left 11/11/2015  . DVT (deep venous thrombosis) ([place]) 11/07/2015  . Malignant neoplasm of right upper lobe of lung ([place]) 10/17/2015  . Disease of lung 11/25/2013  . BP (high blood pressure) 11/25/2013  . Atrial fibrillation ([place]) 11/22/2013  . Congestive heart failure ([place]) 11/22/2013  . Chronic kidney disease 11/22/2013  . Chronic obstructive pulmonary disease ([place]) 11/22/2013  . PNA (pneumonia) 11/22/2013    Past Surgical History:  Procedure Laterality Date  . KNEE SURGERY Bilateral   . PERIPHERAL VASCULAR CATHETERIZATION Left 11/11/2015   Procedure: Thrombectomy;  Surgeon: Katha Cabal, MD;  Location: [facility];  Service: Cardiovascular;  Laterality: Left;  . PORTACATH PLACEMENT N/A 10/29/2015   Procedure: INSERTION  PORT-A-CATH;  Surgeon: Nestor Lewandowsky, MD;  Location: [facility] ORS;  Service: General;  Laterality: N/A;  . TONSILLECTOMY      Prior to Admission medications   Medication Sig Start Date End Date Taking? Authorizing Provider  albuterol (PROAIR HFA) 108 (90 Base) MCG/ACT inhaler Inhale 1-2 puffs into the lungs every 4 (four) hours as needed. 11/18/15   Lloyd Huger, MD  apixaban (ELIQUIS) 5 MG TABS tablet Take 1 tablet (5 mg total) by mouth 2 (two) times daily. 11/30/15   Lloyd Huger, MD  benzonatate (TESSALON) 200 MG capsule Take 1 capsule (200 mg total) by mouth 3 (three) times daily as needed for cough. Reported on 11/25/2015 12/09/15   Lloyd Huger, MD  lidocaine-prilocaine (EMLA) cream Apply to affected area once 10/28/15   Lloyd Huger, MD  Multiple Vitamin (MULTI-VITAMINS) TABS Take 1 tablet by mouth daily.     Historical Provider, MD  ondansetron (ZOFRAN) 8 MG tablet Take 1 tablet (8 mg total) by mouth 2 (two) times daily as needed for refractory nausea / vomiting. Start on day 3 after chemo. 12/09/15   Lloyd Huger, MD  Oxycodone HCl 10 MG TABS Take 1 tablet (10 mg total) by mouth every 6 (six) hours as needed. 11/18/15   Lloyd Huger, MD  prochlorperazine (COMPAZINE) 10 MG tablet Take 1 tablet (10 mg total) by mouth every 6 (six) hours as needed (Nausea or vomiting). 10/20/15  Lloyd Huger, MD     Allergies Aleve [naproxen sodium]  Family History  Problem Relation Age of Onset  . Diabetes Father   . Hypertension Father     Social History Social History  Substance Use Topics  . Smoking status: Former Smoker    Packs/day: 2.00    Years: 50.00    Types: Cigarettes    Quit date: 08/07/2013  . Smokeless tobacco: Never Used  . Alcohol use 0.0 oz/week     Comment: rarely    Review of Systems  Constitutional: No fever/chills Eyes: No visual changes.   Cardiovascular: Denies chest pain. Respiratory: Denies shortness of breath.Admits to  cough Gastrointestinal: No abdominal pain.  No nausea, no vomiting.   Genitourinary: Negative for dysuria. Musculoskeletal: Negative for back pain. Skin: Redness right calf Neurological: Negative for headaches or weakness  10-point ROS otherwise negative.  ____________________________________________   PHYSICAL EXAM:  VITAL SIGNS: ED Triage Vitals [02/06/16 1313]  Enc Vitals Group     BP (!) 182/119     Pulse Rate (!) 56     Resp 18     Temp 97.6 F (36.4 C)     Temp Source Oral     SpO2 97 %     Weight 164 lb (74.4 kg)     Height '6\' 3"'$  (1.905 m)     Head Circumference      Peak Flow      Pain Score      Pain Loc      Pain Edu?      Excl. in [place]?     Constitutional: Alert and oriented. No acute distress. Pleasant and interactive Eyes: Conjunctivae are normal.  Head: Atraumatic. Nose: No congestion/rhinnorhea. Mouth/Throat: Mucous membranes are moist.    Cardiovascular: Tachycardia, regular rhythm. Grossly normal heart sounds.  Good peripheral circulation. Respiratory: Normal respiratory effort.  No retractions. Lungs CTAB. Gastrointestinal: Soft and nontender. No distention.  No CVA tenderness. Genitourinary: deferred Musculoskeletal:   Warm and well perfused lower extremities. On the medial right calf and 2 distinct locations there is no area of redness overlying the saphenous vein consistent with thrombophlebitis, superficial. No significant calf swelling or tenderness. 2+ distal pulses Neurologic:  Normal speech and language. No gross focal neurologic deficits are appreciated.  Skin:  Skin is warm, dry and intact. Psychiatric: Mood and affect are normal. Speech and behavior are normal.  ____________________________________________   LABS (all labs ordered are listed, but only abnormal results are displayed)  Labs Reviewed  CBC WITH DIFFERENTIAL/PLATELET - Abnormal; Notable for the following:       Result Value   WBC 24.0 (*)    Hemoglobin 12.0 (*)    HCT  36.4 (*)    RDW 21.3 (*)    Neutro Abs 22.6 (*)    Lymphs Abs 0.5 (*)    All other components within normal limits  COMPREHENSIVE METABOLIC PANEL - Abnormal; Notable for the following:    Sodium 131 (*)    Potassium 3.0 (*)    Chloride 95 (*)    Glucose, Bld 205 (*)    Albumin 3.2 (*)    ALT 16 (*)    Alkaline Phosphatase 137 (*)    All other components within normal limits  URINALYSIS COMPLETEWITH MICROSCOPIC ([facility] ONLY) - Abnormal; Notable for the following:    Color, Urine COLORLESS (*)    APPearance CLEAR (*)    Specific Gravity, Urine 1.003 (*)    Hgb urine dipstick 1+ (*)  All other components within normal limits  CULTURE, BLOOD (ROUTINE X 2)  CULTURE, BLOOD (ROUTINE X 2)  LACTIC ACID, PLASMA  LACTIC ACID, PLASMA   ____________________________________________  EKG  None ____________________________________________  RADIOLOGY  Chest x-ray concerning for pneumonia ____________________________________________   PROCEDURES  Procedure(s) performed: No    Critical Care performed: No ____________________________________________   INITIAL IMPRESSION / ASSESSMENT AND PLAN / ED COURSE  Pertinent labs & imaging results that were available during my care of the patient were reviewed by me and considered in my medical decision making (see chart for details).  Patient presents with concerns for possible DVT, his exam is most consistent with thrombophlebitis, superficial. We will check labs however and sent for ultrasound given his history.  Lab work is significant for elevated white blood cell count. Discussed with Dr. Grayland Ormond he agrees with checking urine and chest x-ray as white blood cell count is higher than expected with Neulasta  Clinical Course  Chest x-ray concerning for pneumonia. Given elevated white blood cell count and initial tachycardia we will treat with IV antibiotics and admit to the hospital. Discussed with patient and family and they agree  with this plan ____________________________________________   FINAL CLINICAL IMPRESSION(S) / ED DIAGNOSES  Final diagnoses:  Community acquired pneumonia  Thrombophlebitis      NEW MEDICATIONS STARTED DURING THIS VISIT:  New Prescriptions   No medications on file     Note:  This document was prepared using Dragon voice recognition software and may include unintentional dictation errors.    Lavonia Drafts, MD 02/06/16 [phone]

## 2016-02-06 NOTE — ED Triage Notes (Addendum)
Pt presents to ED c/o redness and swelling and pain to RIGHT LE that he noticed yesterday. Hx DVT to LLE, has had IVC filter placed.

## 2016-02-07 DIAGNOSIS — J449 Chronic obstructive pulmonary disease, unspecified: Secondary | ICD-10-CM

## 2016-02-07 DIAGNOSIS — I4891 Unspecified atrial fibrillation: Secondary | ICD-10-CM

## 2016-02-07 DIAGNOSIS — Z9221 Personal history of antineoplastic chemotherapy: Secondary | ICD-10-CM

## 2016-02-07 DIAGNOSIS — I509 Heart failure, unspecified: Secondary | ICD-10-CM

## 2016-02-07 DIAGNOSIS — J189 Pneumonia, unspecified organism: Secondary | ICD-10-CM

## 2016-02-07 DIAGNOSIS — R5383 Other fatigue: Secondary | ICD-10-CM

## 2016-02-07 DIAGNOSIS — N189 Chronic kidney disease, unspecified: Secondary | ICD-10-CM

## 2016-02-07 DIAGNOSIS — R05 Cough: Secondary | ICD-10-CM

## 2016-02-07 DIAGNOSIS — M7989 Other specified soft tissue disorders: Secondary | ICD-10-CM

## 2016-02-07 DIAGNOSIS — I129 Hypertensive chronic kidney disease with stage 1 through stage 4 chronic kidney disease, or unspecified chronic kidney disease: Secondary | ICD-10-CM

## 2016-02-07 DIAGNOSIS — R531 Weakness: Secondary | ICD-10-CM

## 2016-02-07 DIAGNOSIS — C3411 Malignant neoplasm of upper lobe, right bronchus or lung: Secondary | ICD-10-CM

## 2016-02-07 DIAGNOSIS — M129 Arthropathy, unspecified: Secondary | ICD-10-CM

## 2016-02-07 DIAGNOSIS — Z87891 Personal history of nicotine dependence: Secondary | ICD-10-CM

## 2016-02-07 DIAGNOSIS — Z923 Personal history of irradiation: Secondary | ICD-10-CM

## 2016-02-07 DIAGNOSIS — D72829 Elevated white blood cell count, unspecified: Secondary | ICD-10-CM

## 2016-02-07 DIAGNOSIS — I809 Phlebitis and thrombophlebitis of unspecified site: Secondary | ICD-10-CM

## 2016-02-07 LAB — CBC
HCT: 36.9 % — ABNORMAL LOW (ref 40.0–52.0)
HEMOGLOBIN: 12 g/dL — AB (ref 13.0–18.0)
MCH: 26.9 pg (ref 26.0–34.0)
MCHC: 32.5 g/dL (ref 32.0–36.0)
MCV: 82.9 fL (ref 80.0–100.0)
PLATELETS: 239 10*3/uL (ref 150–440)
RBC: 4.45 MIL/uL (ref 4.40–5.90)
RDW: 21 % — ABNORMAL HIGH (ref 11.5–14.5)
WBC: 22.8 10*3/uL — ABNORMAL HIGH (ref 3.8–10.6)

## 2016-02-07 LAB — BASIC METABOLIC PANEL
Anion gap: 8 (ref 5–15)
BUN: 11 mg/dL (ref 6–20)
CHLORIDE: 99 mmol/L — AB (ref 101–111)
CO2: 27 mmol/L (ref 22–32)
CREATININE: 1.04 mg/dL (ref 0.61–1.24)
Calcium: 9 mg/dL (ref 8.9–10.3)
GFR calc non Af Amer: >60 mL/min (ref >60)
Glucose, Bld: 129 mg/dL — ABNORMAL HIGH (ref 65–99)
Potassium: 3.9 mmol/L (ref 3.5–5.1)
Sodium: 134 mmol/L — ABNORMAL LOW (ref 135–145)

## 2016-02-07 NOTE — Plan of Care (Signed)
Problem: Education: Goal: Knowledge of disease or condition will improve Outcome: Progressing Handout given to patient concerning venous thromboembolism. Pt currently on Eliquis.

## 2016-02-07 NOTE — Progress Notes (Signed)
[place] [facility] [place] NAME: Sean Hall    MR#:  [id number]  DATE OF BIRTH:  07/30/1950  SUBJECTIVE:   Feels a lot better today REVIEW OF SYSTEMS:   Review of Systems  Constitutional: Negative for chills, fever and weight loss.  HENT: Negative for ear discharge, ear pain and nosebleeds.   Eyes: Negative for blurred vision, pain and discharge.  Respiratory: Negative for sputum production, shortness of breath, wheezing and stridor.   Cardiovascular: Negative for chest pain, palpitations, orthopnea and PND.  Gastrointestinal: Negative for abdominal pain, diarrhea, nausea and vomiting.  Genitourinary: Negative for frequency and urgency.  Musculoskeletal: Positive for joint pain and myalgias. Negative for back pain.  Neurological: Positive for weakness. Negative for sensory change, speech change and focal weakness.  Psychiatric/Behavioral: Negative for depression and hallucinations. The patient is not nervous/anxious.    Tolerating Diet:yes Tolerating PT: pending  DRUG ALLERGIES:   Allergies  Allergen Reactions  . Aleve [Naproxen Sodium] Hives, Shortness Of Breath and Swelling    Naproxin    VITALS:  Blood pressure (!) 182/82, pulse (!) 102, temperature 97.5 F (36.4 C), temperature source Oral, resp. rate 18, height '6\' 3"'$  (1.905 m), weight 74.5 kg (164 lb 3.2 oz), SpO2 95 %.  PHYSICAL EXAMINATION:   Physical Exam  GENERAL:  65 y.o.-year-old patient lying in the bed with no acute distress.  EYES: Pupils equal, round, reactive to light and accommodation. No scleral icterus. Extraocular muscles intact.  HEENT: Head atraumatic, normocephalic. Oropharynx and nasopharynx clear.  NECK:  Supple, no jugular venous distention. No thyroid enlargement, no tenderness.  LUNGS: Normal breath sounds bilaterally, no wheezing, rales, rhonchi. No use of accessory muscles of respiration.  CARDIOVASCULAR: S1, S2 normal. No murmurs, rubs, or  gallops.  ABDOMEN: Soft, nontender, nondistended. Bowel sounds present. No organomegaly or mass.  EXTREMITIES: No cyanosis, clubbing or edema b/l.    NEUROLOGIC: Cranial nerves II through XII are intact. No focal Motor or sensory deficits b/l.   PSYCHIATRIC:  patient is alert and oriented x 3.  SKIN: No obvious rash, lesion, or ulcer.   LABORATORY PANEL:  CBC  Recent Labs Lab 02/07/16 0743  WBC 22.8*  HGB 12.0*  HCT 36.9*  PLT 239    Chemistries   Recent Labs Lab 02/06/16 1339 02/07/16 0743  NA 131* 134*  K 3.0* 3.9  CL 95* 99*  CO2 25 27  GLUCOSE 205* 129*  BUN 12 11  CREATININE 1.07 1.04  CALCIUM 9.0 9.0  MG 1.6*  --   AST 19  --   ALT 16*  --   ALKPHOS 137*  --   BILITOT 0.4  --    Cardiac Enzymes No results for input(s): TROPONINI in the last 168 hours. RADIOLOGY:  Dg Chest 2 View  Result Date: 02/06/2016 CLINICAL DATA:  Redness and swelling in the right lower extremity. Known lung cancer in the right upper lobe. EXAM: CHEST  2 VIEW COMPARISON:  November 04, 2015 FINDINGS: There was dense consolidation in the right upper lobe on the previous study which has improved. There is now opacity throughout the right central lung, not seen previously. Scarring in the left apex. The left Port-A-Cath is stable. No pneumothorax. No change in the cardiomediastinal silhouette. No other interval changes. IMPRESSION: The dense consolidation in the right upper lobe is improved consistent with interval treatment. There is patchy opacity throughout the right central lung. Whether this is pneumonia or post treatment change  is unclear on this single study. Recommend clinical correlation and follow-up to resolution. Electronically Signed   By: Dorise Bullion III M.D   On: 02/06/2016 15:48   US Venous Img Lower Unilateral Right  Result Date: 02/06/2016 CLINICAL DATA:  Air edema and pain for 1 day in the right lower extremity EXAM: RIGHT LOWER EXTREMITY VENOUS DUPLEX ULTRASOUND TECHNIQUE:  Doppler venous assessment of the left lower extremity deep venous system was performed, including characterization of spectral flow, compressibility, and phasicity. COMPARISON:  None. FINDINGS: There is complete compressibility of the right common femoral, femoral, and popliteal veins. Doppler analysis demonstrates respiratory phasicity and augmentation of flow with calf compression. No obvious calf vein thrombosis. There is occlusive thrombus in the right great saphenous vein beginning in the distal thigh to the distal calf. Incidental imaging of the left common femoral vein demonstrates nonocclusive thrombus within the left common femoral vein. IMPRESSION: There is no evidence of DVT in the right lower extremity There is superficial occlusive thrombus in the right greater saphenous vein as described There is partially occlusive DVT in the left common femoral vein. Compared to the prior study dated 11/07/2015, this has slightly improved. Electronically Signed   By: Marybelle Killings M.D.   On: 02/06/2016 14:53   ASSESSMENT AND PLAN:  65 year old male with a history of lung cancer currently being treated with chemoradiation who initially presented with right lower extremity pain due to thrombophlebitis and found to have sepsis due to pneumonia.  1. Sepsis: Patient presents with leukocytosis and tachycardia. He most likely does not mount a fever due to immunosuppressed state. Sepsis due to pneumonia seen on chest x-ray and history of cough for the past 1 week. on cefepime and follow up on blood cultures. Continue IV fluids.  2. Pneumonia and immunosuppressed patient currently receiving chemoradiation: -cont IV cefepime and follow up on blood cultures.  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  4. Elevated blood pressure without diagnosis of hypertension: Hydralazine when necessary  ordered.  5. History DVT: Continue anticoagulation with Eliquis. Warm compress for superficial thrombophlebitis  Case discussed with Care Management/Social Worker. Management plans discussed with the patient, family and they are in agreement.  CODE STATUS: full  DVT Prophylaxis: eliquis TOTAL TIME TAKING CARE OF THIS PATIENT: 30 minutes.  >50% time spent on counselling and coordination of care  POSSIBLE D/C IN 1-2 DAYS, DEPENDING ON CLINICAL CONDITION.  Note: This dictation was prepared with Dragon dictation along with smaller phrase technology. Any transcriptional errors that result from this process are unintentional.  Zaryiah Barz M.D on 02/07/2016 at 3:25 PM  Between 7am to 6pm - Pager - [phone]  After 6pm go to [email or web address] - password [email or web address] [facility]  [facility]  Office  [phone]  CC: Primary care physician; No PCP Per Patient

## 2016-02-07 NOTE — Consult Note (Signed)
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  INTERVAL HISTORY: Patient is a 65 year old male who is currently receiving chemotherapy for the above stated lung cancer. He presented to the emergency room with right leg pain and swelling and was found to have superficial thrombophlebitis. Patient also noted to have an elevated white count without fever. Subsequent chest x-ray revealed concern for possible community-acquired pneumonia. Currently, patient feels well and only complains of right leg pain. He has no neurologic complaints. He does admit to increased weakness and fatigue since his last chemotherapy.  He denies any recent fevers. He continues to have a chronic cough, but denies any shortness of breath, chest pain, or hemoptysis. He has a good appetite and denies any nausea, vomiting, constipation, or diarrhea. He has no urinary complaints. Patient otherwise feels well and offers no further specific complaints.  REVIEW OF SYSTEMS:   Review of Systems  Constitutional: Positive for malaise/fatigue. Negative for fever and weight loss.  Respiratory: Positive for cough. Negative for hemoptysis and shortness of breath.   Cardiovascular: Positive for leg swelling. Negative for chest pain.  Gastrointestinal: Negative.  Negative for abdominal pain.  Genitourinary: Negative.   Musculoskeletal:       Right leg pain.  Neurological: Positive for weakness.  Psychiatric/Behavioral: Negative.  The patient is not nervous/anxious.     As per HPI. Otherwise, a complete review of systems is  negative.  PAST MEDICAL HISTORY: Past Medical History:  Diagnosis Date  . Arthritis   . Atrial fibrillation ([place])   . Cancer ([place]) 2017   Lung  . CHF (congestive heart failure) ([place])   . Chronic kidney disease   . COPD (chronic obstructive pulmonary disease) ([place])   . Disease of lung   . DVT (deep venous thrombosis) ([facility])    left leg  . Dysrhythmia   . Headache   . History of chemotherapy   . History of radiation therapy   . Hypertension   . Pneumonia     PAST SURGICAL HISTORY: Past Surgical History:  Procedure Laterality Date  . KNEE SURGERY Bilateral   . PERIPHERAL VASCULAR CATHETERIZATION Left 11/11/2015   Procedure: Thrombectomy;  Surgeon: Katha Cabal, MD;  Location: [facility];  Service: Cardiovascular;  Laterality: Left;  . PORTACATH PLACEMENT N/A 10/29/2015   Procedure: INSERTION PORT-A-CATH;  Surgeon: Nestor Lewandowsky, MD;  Location: [facility];  Service: General;  Laterality: N/A;  . TONSILLECTOMY      FAMILY HISTORY: Family History  Problem Relation Age of Onset  . Diabetes Father   . Hypertension Father     ADVANCED DIRECTIVES (Y/N):  '@ADVDIR'$ @  HEALTH MAINTENANCE: Social History  Substance Use Topics  . Smoking status: Former Smoker    Packs/day: 2.00    Years: 50.00    Types: Cigarettes    Quit date: 08/07/2013  . Smokeless tobacco: Never Used  . Alcohol use 0.0 oz/week     Comment: rarely     Colonoscopy:  PAP:  Bone density:  Lipid panel:  Allergies  Allergen Reactions  . Aleve [Naproxen Sodium] Hives, Shortness Of Breath and Swelling    Naproxin    Current Facility-Administered Medications  Medication Dose Route Frequency Provider Last Rate Last Dose  . 0.9 %  sodium chloride infusion   Intravenous Continuous Bettey Costa, MD 75 mL/hr at 02/07/16 0800    . acetaminophen (TYLENOL) tablet 650 mg  650 mg Oral Q6H PRN Bettey Costa, MD       Or  . acetaminophen (TYLENOL) suppository 650 mg  650 mg Rectal Q6H PRN Bettey Costa, MD      .  apixaban (ELIQUIS) tablet 5 mg  5 mg Oral BID Bettey Costa, MD   5 mg at 02/07/16 0800  . benzonatate (TESSALON) capsule 200 mg  200 mg Oral TID PRN Bettey Costa, MD      . ceFEPIme (MAXIPIME) 1 g in dextrose 5 % 50 mL IVPB  1 g Intravenous Q12H Sital Mody, MD   1 g at 02/07/16 0800  . hydrALAZINE (APRESOLINE) injection 10 mg  10 mg Intravenous Q6H PRN Bettey Costa, MD      . multivitamin with minerals tablet 1 tablet  1 tablet Oral Daily Bettey Costa, MD   1 tablet at 02/07/16 0800  . ondansetron (ZOFRAN) tablet 4 mg  4 mg Oral Q6H PRN Bettey Costa, MD   4 mg at 02/07/16 1018   Or  . ondansetron (ZOFRAN) injection 4 mg  4 mg Intravenous Q6H PRN Bettey Costa, MD      . ondansetron (ZOFRAN) tablet 8 mg  8 mg Oral BID PRN Bettey Costa, MD      . oxyCODONE (Oxy IR/ROXICODONE) immediate release tablet 10 mg  10 mg Oral Q6H PRN Bettey Costa, MD   10 mg at 02/07/16 0351  . prochlorperazine (COMPAZINE) tablet 10 mg  10 mg Oral Q6H PRN Bettey Costa, MD      . senna-docusate (Senokot-S) tablet 1 tablet  1 tablet Oral QHS PRN Bettey Costa, MD      . sodium chloride flush (NS) 0.9 % injection 3 mL  3 mL Intravenous Q12H Bettey Costa, MD       Facility-Administered Medications Ordered in Other Encounters  Medication Dose Route Frequency Provider Last Rate Last Dose  . heparin lock flush 100 unit/mL  500 Units Intravenous Once Lloyd Huger, MD      . sodium chloride flush (NS) 0.9 % injection 10 mL  10 mL Intravenous PRN Lloyd Huger, MD        OBJECTIVE: Vitals:   02/06/16 2037 02/07/16 0435  BP: (!) 152/71 (!) 146/83  Pulse: 100 92  Resp:  18  Temp:  98.6 F (37 C)     Body mass index is 20.52 kg/m.    ECOG FS:0 - Asymptomatic  General: Well-developed, well-nourished, no acute distress. Eyes: Pink conjunctiva, anicteric sclera. HEENT: Normocephalic, moist mucous membranes, clear oropharnyx. Lungs: Clear to auscultation bilaterally. Heart: Regular rate and rhythm. No rubs, murmurs, or gallops. Abdomen:  Soft, nontender, nondistended. No organomegaly noted, normoactive bowel sounds. Musculoskeletal: No edema, cyanosis, or clubbing. Mild tenderness to palpation of right lower extremity. Neuro: Alert, answering all questions appropriately. Cranial nerves grossly intact. Skin: No rashes or petechiae noted. Psych: Normal affect. Lymphatics: No cervical, calvicular, axillary or inguinal LAD.   LAB RESULTS:  Lab Results  Component Value Date   NA 134 (L) 02/07/2016   K 3.9 02/07/2016   CL 99 (L) 02/07/2016   CO2 27 02/07/2016   GLUCOSE 129 (H) 02/07/2016   BUN 11 02/07/2016   CREATININE 1.04 02/07/2016   CALCIUM 9.0 02/07/2016   PROT 7.5 02/06/2016  ALBUMIN 3.2 (L) 02/06/2016   AST 19 02/06/2016   ALT 16 (L) 02/06/2016   ALKPHOS 137 (H) 02/06/2016   BILITOT 0.4 02/06/2016   GFRNONAA >60 02/07/2016   GFRAA >60 02/07/2016    Lab Results  Component Value Date   WBC 22.8 (H) 02/07/2016   NEUTROABS 22.6 (H) 02/06/2016   HGB 12.0 (L) 02/07/2016   HCT 36.9 (L) 02/07/2016   MCV 82.9 02/07/2016   PLT 239 02/07/2016     STUDIES: Dg Chest 2 View  Result Date: 02/06/2016 CLINICAL DATA:  Redness and swelling in the right lower extremity. Known lung cancer in the right upper lobe. EXAM: CHEST  2 VIEW COMPARISON:  November 04, 2015 FINDINGS: There was dense consolidation in the right upper lobe on the previous study which has improved. There is now opacity throughout the right central lung, not seen previously. Scarring in the left apex. The left Port-A-Cath is stable. No pneumothorax. No change in the cardiomediastinal silhouette. No other interval changes. IMPRESSION: The dense consolidation in the right upper lobe is improved consistent with interval treatment. There is patchy opacity throughout the right central lung. Whether this is pneumonia or post treatment change is unclear on this single study. Recommend clinical correlation and follow-up to resolution. Electronically Signed   By: Dorise Bullion III M.D   On: 02/06/2016 15:48   US Venous Img Lower Unilateral Right  Result Date: 02/06/2016 CLINICAL DATA:  Air edema and pain for 1 day in the right lower extremity EXAM: RIGHT LOWER EXTREMITY VENOUS DUPLEX ULTRASOUND TECHNIQUE: Doppler venous assessment of the left lower extremity deep venous system was performed, including characterization of spectral flow, compressibility, and phasicity. COMPARISON:  None. FINDINGS: There is complete compressibility of the right common femoral, femoral, and popliteal veins. Doppler analysis demonstrates respiratory phasicity and augmentation of flow with calf compression. No obvious calf vein thrombosis. There is occlusive thrombus in the right great saphenous vein beginning in the distal thigh to the distal calf. Incidental imaging of the left common femoral vein demonstrates nonocclusive thrombus within the left common femoral vein. IMPRESSION: There is no evidence of DVT in the right lower extremity There is superficial occlusive thrombus in the right greater saphenous vein as described There is partially occlusive DVT in the left common femoral vein. Compared to the prior study dated 11/07/2015, this has slightly improved. Electronically Signed   By: Marybelle Killings M.D.   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  PLAN:    1. Stage IIIB poorly differentiated carcinoma of the right upper lobe lung: Patient received his last chemotherapy with carboplatinum and Taxol on January 27, 2016. He has been instructed to keep his follow-up appointment on February 17, 2016 to receive cycle 2 of 2 of his consolidation chemotherapy.  Patient completed XRT on January 04, 2016. Will reimage approximately 6-8 weeks at the conclusion of his consolidation chemotherapy.  2. Leukocytosis: Patient also received Neulasta on  January 27, 2016. There may also be a reactive component as well. Trending down. 3. Bilateral DVT: No evidence of recurrent DVT. Patient had thrombectomy on November 11, 2015. Continue Eliquis as prescribed. Patient will require anticoagulation for 6 months given the extent of his blood clot. 4. Pain: Continue oxycodone PRN. 5. Pneumonia: Continue current antibiotics as prescribed.   Appreciate consult, call with questions.  Lloyd Huger, MD   02/07/2016  11:07 AM

## 2016-02-08 LAB — CBC
HEMATOCRIT: 32.1 % — AB (ref 40.0–52.0)
HEMOGLOBIN: 10.6 g/dL — AB (ref 13.0–18.0)
MCH: 26.9 pg (ref 26.0–34.0)
MCHC: 32.9 g/dL (ref 32.0–36.0)
MCV: 81.8 fL (ref 80.0–100.0)
Platelets: 206 10*3/uL (ref 150–440)
RBC: 3.93 MIL/uL — AB (ref 4.40–5.90)
RDW: 20.7 % — ABNORMAL HIGH (ref 11.5–14.5)
WBC: 23.6 10*3/uL — AB (ref 3.8–10.6)

## 2016-02-08 MED ORDER — CEFUROXIME AXETIL 500 MG PO TABS
500.0000 mg | ORAL_TABLET | Freq: Two times a day (BID) | ORAL | Status: DC
  Administered 2016-02-08: 12:00:00 500 mg via ORAL
  Filled 2016-02-08 (×2): qty 1

## 2016-02-08 MED ORDER — CEFUROXIME AXETIL 500 MG PO TABS: 500.0000 mg | ORAL_TABLET | Freq: Two times a day (BID) | ORAL | 0 refills | Status: DC

## 2016-02-08 NOTE — Discharge Summary (Signed)
Sean Hall at [place] NAME: Sean Hall    MR#:  [id number]  DATE OF BIRTH:  12/23/1950  DATE OF ADMISSION:  02/06/2016 ADMITTING PHYSICIAN: Bettey Costa, MD  DATE OF DISCHARGE: 02/08/16  PRIMARY CARE PHYSICIAN: No PCP Per Patient    ADMISSION DIAGNOSIS:  Thrombophlebitis [I80.9] Community acquired pneumonia [J18.9]  DISCHARGE DIAGNOSIS:  Right UL pneumonia Right LE thrombophlebitis and superficial DVT -h/o LEft LE DVT on eliquis  H/o Lung cancer undergoing chemo  SECONDARY DIAGNOSIS:   Past Medical History:  Diagnosis Date  . Arthritis   . Atrial fibrillation ([place])   . Cancer ([place]) 2017   Lung  . CHF (congestive heart failure) ([place])   . Chronic kidney disease   . COPD (chronic obstructive pulmonary disease) ([place])   . Disease of lung   . DVT (deep venous thrombosis) ([facility])    left leg  . Dysrhythmia   . Headache   . History of chemotherapy   . History of radiation therapy   . Hypertension   . Pneumonia     HOSPITAL COURSE:   65 year old male with a history of lung cancer currently being treated with chemoradiation who initially presented with right lower extremity pain due to thrombophlebitis and found to have sepsis due to pneumonia.  1. Sepsis: Patient presents with leukocytosis and tachycardia. He most likely does not mount a fever due to immunosuppressed state. Sepsis due to pneumonia seen on chest x-ray and history of cough for the past 1 week. on cefepime  -follow up on blood cultures negative, pt afebrile. Wbc 23 K recieved IV fluids.  2. Pneumonia and immunosuppressed patient currently receiving chemoradiation:  -cont IV cefepime---change to po cefprdoxine  3.stage IIIB poorly differentiated carcinoma of the lung, right upper lobe;  Continue when necessary pain medications  -pt will f/u Dr Grayland Ormond on oct 9th. He is undergoing chemo -dr Grayland Ormond ok with pt going home  4. Elevated blood pressure  without diagnosis of hypertension: Hydralazine when necessary ordered.  5. History DVT: Continue anticoagulation with Eliquis. Warm compress for superficial thrombophlebitis  Overall improving sats >92% on RA  Will d/c home CONSULTS OBTAINED:  Treatment Team:  Lloyd Huger, MD  DRUG ALLERGIES:   Allergies  Allergen Reactions  . Aleve [Naproxen Sodium] Hives, Shortness Of Breath and Swelling    Naproxin    DISCHARGE MEDICATIONS:   Current Discharge Medication List    START taking these medications   Details  cefUROXime (CEFTIN) 500 MG tablet Take 1 tablet (500 mg total) by mouth every 12 (twelve) hours. Qty: 16 tablet, Refills: 0      CONTINUE these medications which have NOT CHANGED   Details  albuterol (PROAIR HFA) 108 (90 Base) MCG/ACT inhaler Inhale 1-2 puffs into the lungs every 4 (four) hours as needed. Qty: 1 Inhaler, Refills: 3   Associated Diagnoses: Malignant neoplasm of upper lobe of right lung ([facility])    apixaban (ELIQUIS) 5 MG TABS tablet Take 1 tablet (5 mg total) by mouth 2 (two) times daily. Qty: 180 tablet, Refills: 3    lidocaine-prilocaine (EMLA) cream Apply to affected area once Qty: 30 g, Refills: 3   Associated Diagnoses: Malignant neoplasm of upper lobe of right lung ([facility])    Multiple Vitamin (MULTI-VITAMINS) TABS Take 1 tablet by mouth daily as needed.     ondansetron (ZOFRAN) 8 MG tablet Take 1 tablet (8 mg total) by mouth 2 (two) times daily as needed for refractory  nausea / vomiting. Start on day 3 after chemo. Qty: 30 tablet, Refills: 1   Associated Diagnoses: Malignant neoplasm of upper lobe of right lung ([facility])    prochlorperazine (COMPAZINE) 10 MG tablet Take 1 tablet (10 mg total) by mouth every 6 (six) hours as needed (Nausea or vomiting). Qty: 30 tablet, Refills: 1   Associated Diagnoses: Malignant neoplasm of upper lobe of right lung ([facility])    Oxycodone HCl 10 MG TABS Take 1 tablet (10 mg total) by mouth every 6 (six) hours as  needed. Qty: 60 tablet, Refills: 0   Associated Diagnoses: Malignant neoplasm of upper lobe of right lung ([place])      STOP taking these medications     benzonatate (TESSALON) 200 MG capsule         If you experience worsening of your admission symptoms, develop shortness of breath, life threatening emergency, suicidal or homicidal thoughts you must seek medical attention immediately by calling 911 or calling your MD immediately  if symptoms less severe.  You Must read complete instructions/literature along with all the possible adverse reactions/side effects for all the Medicines you take and that have been prescribed to you. Take any new Medicines after you have completely understood and accept all the possible adverse reactions/side effects.   Please note  You were cared for by a hospitalist during your hospital stay. If you have any questions about your discharge medications or the care you received while you were in the hospital after you are discharged, you can call the unit and asked to speak with the hospitalist on call if the hospitalist that took care of you is not available. Once you are discharged, your primary care physician will handle any further medical issues. Please note that NO REFILLS for any discharge medications will be authorized once you are discharged, as it is imperative that you return to your primary care physician (or establish a relationship with a primary care physician if you do not have one) for your aftercare needs so that they can reassess your need for medications and monitor your lab values. Today   SUBJECTIVE  Feeling ok. No new cough or chest pain. No sob   VITAL SIGNS:  Blood pressure (!) 161/86, pulse 96, temperature 98.6 F (37 C), temperature source Oral, resp. rate 19, height '6\' 3"'$  (1.905 m), weight 74.5 kg (164 lb 3.2 oz), SpO2 95 %.  I/O:   Intake/Output Summary (Last 24 hours) at 02/08/16 1147 Last data filed at 02/08/16 0934  Gross per 24  hour  Intake           1747.5 ml  Output             1800 ml  Net            -52.5 ml    PHYSICAL EXAMINATION:  GENERAL:  65 y.o.-year-old patient lying in the bed with no acute distress.  EYES: Pupils equal, round, reactive to light and accommodation. No scleral icterus. Extraocular muscles intact.  HEENT: Head atraumatic, normocephalic. Oropharynx and nasopharynx clear.  NECK:  Supple, no jugular venous distention. No thyroid enlargement, no tenderness.  LUNGS: Normal breath sounds bilaterally, no wheezing, rales,rhonchi or crepitation. No use of accessory muscles of respiration.  CARDIOVASCULAR: S1, S2 normal. No murmurs, rubs, or gallops.  ABDOMEN: Soft, non-tender, non-distended. Bowel sounds present. No organomegaly or mass.  EXTREMITIES: No pedal edema, cyanosis, or clubbing.  NEUROLOGIC: Cranial nerves II through XII are intact. Muscle strength 5/5 in all extremities.  Sensation intact. Gait not checked.  PSYCHIATRIC: The patient is alert and oriented x 3.  SKIN: No obvious rash, lesion, or ulcer.   DATA REVIEW:   CBC   Recent Labs Lab 02/08/16 0419  WBC 23.6*  HGB 10.6*  HCT 32.1*  PLT 206    Chemistries   Recent Labs Lab 02/06/16 1339 02/07/16 0743  NA 131* 134*  K 3.0* 3.9  CL 95* 99*  CO2 25 27  GLUCOSE 205* 129*  BUN 12 11  CREATININE 1.07 1.04  CALCIUM 9.0 9.0  MG 1.6*  --   AST 19  --   ALT 16*  --   ALKPHOS 137*  --   BILITOT 0.4  --     Microbiology Results   Recent Results (from the past 240 hour(s))  Blood culture (routine x 2)     Status: None (Preliminary result)   Collection Time: 02/06/16  5:05 PM  Result Value Ref Range Status   Specimen Description BLOOD RIGHT ASSIST CONTROL  Final   Special Requests BOTTLES DRAWN AEROBIC AND ANAEROBIC 10CC  Final   Culture NO GROWTH 2 DAYS  Final   Report Status PENDING  Incomplete  Blood culture (routine x 2)     Status: None (Preliminary result)   Collection Time: 02/06/16  5:05 PM  Result  Value Ref Range Status   Specimen Description BLOOD LEFT FOREARM  Final   Special Requests BOTTLES DRAWN AEROBIC AND ANAEROBIC 10CC  Final   Culture NO GROWTH 2 DAYS  Final   Report Status PENDING  Incomplete    RADIOLOGY:  Dg Chest 2 View  Result Date: 02/06/2016 CLINICAL DATA:  Redness and swelling in the right lower extremity. Known lung cancer in the right upper lobe. EXAM: CHEST  2 VIEW COMPARISON:  November 04, 2015 FINDINGS: There was dense consolidation in the right upper lobe on the previous study which has improved. There is now opacity throughout the right central lung, not seen previously. Scarring in the left apex. The left Port-A-Cath is stable. No pneumothorax. No change in the cardiomediastinal silhouette. No other interval changes. IMPRESSION: The dense consolidation in the right upper lobe is improved consistent with interval treatment. There is patchy opacity throughout the right central lung. Whether this is pneumonia or post treatment change is unclear on this single study. Recommend clinical correlation and follow-up to resolution. Electronically Signed   By: Dorise Bullion III M.D   On: 02/06/2016 15:48   US Venous Img Lower Unilateral Right  Result Date: 02/06/2016 CLINICAL DATA:  Air edema and pain for 1 day in the right lower extremity EXAM: RIGHT LOWER EXTREMITY VENOUS DUPLEX ULTRASOUND TECHNIQUE: Doppler venous assessment of the left lower extremity deep venous system was performed, including characterization of spectral flow, compressibility, and phasicity. COMPARISON:  None. FINDINGS: There is complete compressibility of the right common femoral, femoral, and popliteal veins. Doppler analysis demonstrates respiratory phasicity and augmentation of flow with calf compression. No obvious calf vein thrombosis. There is occlusive thrombus in the right great saphenous vein beginning in the distal thigh to the distal calf. Incidental imaging of the left common femoral vein  demonstrates nonocclusive thrombus within the left common femoral vein. IMPRESSION: There is no evidence of DVT in the right lower extremity There is superficial occlusive thrombus in the right greater saphenous vein as described There is partially occlusive DVT in the left common femoral vein. Compared to the prior study dated 11/07/2015, this has slightly improved. Electronically Signed  By: Marybelle Killings M.D.   On: 02/06/2016 14:53     Management plans discussed with the patient, family and they are in agreement.  CODE STATUS:     Code Status Orders        Start     Ordered   02/06/16 1823  Full code  Continuous     02/06/16 1822    Code Status History    Date Active Date Inactive Code Status Order ID Comments User Context   11/11/2015  4:29 PM 11/12/2015  5:56 PM Full Code [id number]  Katha Cabal, MD Inpatient   11/07/2015 11:31 PM 11/09/2015  6:55 PM Full Code [id number]  Lance Coon, MD Inpatient      TOTAL TIME TAKING CARE OF THIS PATIENT:40* minutes.    Johnnetta Holstine M.D on 02/08/2016 at 11:47 AM  Between 7am to 6pm - Pager - [phone] After 6pm go to [email or web address] - password [email or web address] [facility]  [facility]  Office  [phone]  CC: Primary care physician; No PCP Per Patient

## 2016-02-08 NOTE — Progress Notes (Signed)
Pt and wife given d/c instructions r/t activity, medications, follow up care, voiced understanding, prescription at [facility], pt and wife informed, voiced understanding, pt d/c home via wheelchair escorted by auxillary

## 2016-02-11 ENCOUNTER — Ambulatory Visit
Admission: RE | Admit: 2016-02-11 | Discharge: 2016-02-11 | Disposition: A | Discharge status: Home / Self Care | Payer: Medicaid Other | Source: Ambulatory Visit | Attending: Oncology | Admitting: Oncology

## 2016-02-11 DIAGNOSIS — M47892 Other spondylosis, cervical region: Secondary | ICD-10-CM | POA: Insufficient documentation

## 2016-02-11 DIAGNOSIS — C3491 Malignant neoplasm of unspecified part of right bronchus or lung: Secondary | ICD-10-CM

## 2016-02-11 DIAGNOSIS — R29898 Other symptoms and signs involving the musculoskeletal system: Secondary | ICD-10-CM | POA: Insufficient documentation

## 2016-02-11 LAB — CULTURE, BLOOD (ROUTINE X 2)
Culture: NO GROWTH
Culture: NO GROWTH

## 2016-02-11 MED ORDER — GADOBENATE DIMEGLUMINE 529 MG/ML IV SOLN
15.0000 mL | Freq: Once | INTRAVENOUS | Status: AC | PRN
  Administered 2016-02-11: 15 mL via INTRAVENOUS

## 2016-02-12 ENCOUNTER — Inpatient Hospital Stay
Admission: EM | Admit: 2016-02-12 | Discharge: 2016-02-15 | DRG: 054 | Disposition: A | Discharge status: Home / Self Care | Payer: Medicaid Other | Attending: Specialist | Admitting: Specialist

## 2016-02-12 ENCOUNTER — Emergency Department: Payer: Medicaid Other

## 2016-02-12 ENCOUNTER — Encounter: Payer: Self-pay | Admitting: Internal Medicine

## 2016-02-12 ENCOUNTER — Other Ambulatory Visit: Payer: Self-pay

## 2016-02-12 ENCOUNTER — Telehealth: Payer: Self-pay | Admitting: *Deleted

## 2016-02-12 DIAGNOSIS — J440 Chronic obstructive pulmonary disease with acute lower respiratory infection: Secondary | ICD-10-CM | POA: Diagnosis present

## 2016-02-12 DIAGNOSIS — Z86718 Personal history of other venous thrombosis and embolism: Secondary | ICD-10-CM | POA: Diagnosis not present

## 2016-02-12 DIAGNOSIS — R5383 Other fatigue: Secondary | ICD-10-CM | POA: Diagnosis not present

## 2016-02-12 DIAGNOSIS — C3490 Malignant neoplasm of unspecified part of unspecified bronchus or lung: Secondary | ICD-10-CM | POA: Diagnosis not present

## 2016-02-12 DIAGNOSIS — E860 Dehydration: Secondary | ICD-10-CM | POA: Diagnosis present

## 2016-02-12 DIAGNOSIS — Z87891 Personal history of nicotine dependence: Secondary | ICD-10-CM

## 2016-02-12 DIAGNOSIS — I482 Chronic atrial fibrillation: Secondary | ICD-10-CM | POA: Diagnosis present

## 2016-02-12 DIAGNOSIS — R4182 Altered mental status, unspecified: Secondary | ICD-10-CM | POA: Diagnosis not present

## 2016-02-12 DIAGNOSIS — Z8249 Family history of ischemic heart disease and other diseases of the circulatory system: Secondary | ICD-10-CM | POA: Diagnosis not present

## 2016-02-12 DIAGNOSIS — C7931 Secondary malignant neoplasm of brain: Principal | ICD-10-CM | POA: Diagnosis present

## 2016-02-12 DIAGNOSIS — I130 Hypertensive heart and chronic kidney disease with heart failure and stage 1 through stage 4 chronic kidney disease, or unspecified chronic kidney disease: Secondary | ICD-10-CM | POA: Diagnosis present

## 2016-02-12 DIAGNOSIS — Z886 Allergy status to analgesic agent status: Secondary | ICD-10-CM

## 2016-02-12 DIAGNOSIS — T458X5A Adverse effect of other primarily systemic and hematological agents, initial encounter: Secondary | ICD-10-CM | POA: Diagnosis present

## 2016-02-12 DIAGNOSIS — M479 Spondylosis, unspecified: Secondary | ICD-10-CM | POA: Diagnosis not present

## 2016-02-12 DIAGNOSIS — Z9221 Personal history of antineoplastic chemotherapy: Secondary | ICD-10-CM | POA: Diagnosis not present

## 2016-02-12 DIAGNOSIS — R29898 Other symptoms and signs involving the musculoskeletal system: Secondary | ICD-10-CM

## 2016-02-12 DIAGNOSIS — C3411 Malignant neoplasm of upper lobe, right bronchus or lung: Secondary | ICD-10-CM | POA: Diagnosis not present

## 2016-02-12 DIAGNOSIS — G936 Cerebral edema: Secondary | ICD-10-CM | POA: Diagnosis present

## 2016-02-12 DIAGNOSIS — Z66 Do not resuscitate: Secondary | ICD-10-CM | POA: Diagnosis present

## 2016-02-12 DIAGNOSIS — D72829 Elevated white blood cell count, unspecified: Secondary | ICD-10-CM | POA: Diagnosis not present

## 2016-02-12 DIAGNOSIS — I5032 Chronic diastolic (congestive) heart failure: Secondary | ICD-10-CM | POA: Diagnosis present

## 2016-02-12 DIAGNOSIS — Z7902 Long term (current) use of antithrombotics/antiplatelets: Secondary | ICD-10-CM

## 2016-02-12 DIAGNOSIS — M47812 Spondylosis without myelopathy or radiculopathy, cervical region: Secondary | ICD-10-CM | POA: Diagnosis present

## 2016-02-12 DIAGNOSIS — Z792 Long term (current) use of antibiotics: Secondary | ICD-10-CM

## 2016-02-12 DIAGNOSIS — N183 Chronic kidney disease, stage 3 (moderate): Secondary | ICD-10-CM | POA: Diagnosis present

## 2016-02-12 DIAGNOSIS — D72828 Other elevated white blood cell count: Secondary | ICD-10-CM | POA: Diagnosis present

## 2016-02-12 DIAGNOSIS — M1990 Unspecified osteoarthritis, unspecified site: Secondary | ICD-10-CM | POA: Diagnosis present

## 2016-02-12 DIAGNOSIS — Z833 Family history of diabetes mellitus: Secondary | ICD-10-CM

## 2016-02-12 DIAGNOSIS — Z923 Personal history of irradiation: Secondary | ICD-10-CM | POA: Diagnosis not present

## 2016-02-12 DIAGNOSIS — Z5111 Encounter for antineoplastic chemotherapy: Secondary | ICD-10-CM | POA: Diagnosis present

## 2016-02-12 LAB — URINALYSIS COMPLETE WITH MICROSCOPIC (ARMC ONLY)
BACTERIA UA: NONE SEEN
BILIRUBIN URINE: NEGATIVE
GLUCOSE, UA: NEGATIVE mg/dL
KETONES UR: NEGATIVE mg/dL
LEUKOCYTES UA: NEGATIVE
Nitrite: NEGATIVE
PH: 7 (ref 5.0–8.0)
Protein, ur: NEGATIVE mg/dL
SQUAMOUS EPITHELIAL / LPF: NONE SEEN
Specific Gravity, Urine: 1.004 — ABNORMAL LOW (ref 1.005–1.030)

## 2016-02-12 LAB — COMPREHENSIVE METABOLIC PANEL
ALT: 9 U/L — AB (ref 17–63)
AST: 18 U/L (ref 15–41)
Albumin: 3.3 g/dL — ABNORMAL LOW (ref 3.5–5.0)
Alkaline Phosphatase: 131 U/L — ABNORMAL HIGH (ref 38–126)
Anion gap: 10 (ref 5–15)
BUN: 15 mg/dL (ref 6–20)
CHLORIDE: 97 mmol/L — AB (ref 101–111)
CO2: 26 mmol/L (ref 22–32)
CREATININE: 1.01 mg/dL (ref 0.61–1.24)
Calcium: 9.3 mg/dL (ref 8.9–10.3)
GFR calc Af Amer: >60 mL/min (ref >60)
GLUCOSE: 146 mg/dL — AB (ref 65–99)
POTASSIUM: 3.7 mmol/L (ref 3.5–5.1)
Sodium: 133 mmol/L — ABNORMAL LOW (ref 135–145)
Total Bilirubin: 0.3 mg/dL (ref 0.3–1.2)
Total Protein: 7.9 g/dL (ref 6.5–8.1)

## 2016-02-12 LAB — CBC
HEMATOCRIT: 35.8 % — AB (ref 40.0–52.0)
Hemoglobin: 12.3 g/dL — ABNORMAL LOW (ref 13.0–18.0)
MCH: 27.9 pg (ref 26.0–34.0)
MCHC: 34.2 g/dL (ref 32.0–36.0)
MCV: 81.6 fL (ref 80.0–100.0)
PLATELETS: 234 10*3/uL (ref 150–440)
RBC: 4.39 MIL/uL — ABNORMAL LOW (ref 4.40–5.90)
RDW: 20.7 % — AB (ref 11.5–14.5)
WBC: 17.8 10*3/uL — AB (ref 3.8–10.6)

## 2016-02-12 LAB — LACTIC ACID, PLASMA
LACTIC ACID, VENOUS: 2.3 mmol/L — AB (ref 0.5–1.9)
Lactic Acid, Venous: 1.7 mmol/L (ref 0.5–1.9)

## 2016-02-12 MED ORDER — APIXABAN 5 MG PO TABS
5.0000 mg | ORAL_TABLET | Freq: Two times a day (BID) | ORAL | Status: DC
  Administered 2016-02-12 – 2016-02-15 (×6): 5 mg via ORAL
  Filled 2016-02-12 (×6): qty 1

## 2016-02-12 MED ORDER — BISACODYL 10 MG RE SUPP: 10.0000 mg | Freq: Every day | RECTAL | Status: DC | PRN

## 2016-02-12 MED ORDER — ALBUTEROL SULFATE HFA 108 (90 BASE) MCG/ACT IN AERS: 1.0000 | INHALATION_SPRAY | RESPIRATORY_TRACT | Status: DC | PRN

## 2016-02-12 MED ORDER — DEXAMETHASONE SODIUM PHOSPHATE 10 MG/ML IJ SOLN
10.0000 mg | Freq: Four times a day (QID) | INTRAMUSCULAR | Status: DC
  Administered 2016-02-12 – 2016-02-13 (×3): 10 mg via INTRAVENOUS
  Filled 2016-02-12 (×3): qty 1

## 2016-02-12 MED ORDER — HYDROCODONE-ACETAMINOPHEN 5-325 MG PO TABS: 1.0000 | ORAL_TABLET | ORAL | Status: DC | PRN

## 2016-02-12 MED ORDER — DEXAMETHASONE SODIUM PHOSPHATE 10 MG/ML IJ SOLN
10.0000 mg | Freq: Once | INTRAMUSCULAR | Status: AC
  Administered 2016-02-12: 10 mg via INTRAVENOUS
  Filled 2016-02-12: qty 1

## 2016-02-12 MED ORDER — ONDANSETRON HCL 4 MG/2ML IJ SOLN: 4.0000 mg | Freq: Four times a day (QID) | INTRAMUSCULAR | Status: DC | PRN

## 2016-02-12 MED ORDER — SODIUM CHLORIDE 0.9 % IV SOLN
INTRAVENOUS | Status: DC
  Administered 2016-02-12: via INTRAVENOUS

## 2016-02-12 MED ORDER — ACETAMINOPHEN 325 MG PO TABS
650.0000 mg | ORAL_TABLET | Freq: Four times a day (QID) | ORAL | Status: DC | PRN
  Administered 2016-02-14 – 2016-02-15 (×2): 650 mg via ORAL
  Filled 2016-02-12 (×2): qty 2

## 2016-02-12 MED ORDER — SODIUM CHLORIDE 0.9 % IV SOLN: 250.0000 mL | INTRAVENOUS | Status: DC | PRN

## 2016-02-12 MED ORDER — ONDANSETRON HCL 4 MG PO TABS: 4.0000 mg | ORAL_TABLET | Freq: Four times a day (QID) | ORAL | Status: DC | PRN

## 2016-02-12 MED ORDER — SODIUM CHLORIDE 0.9% FLUSH: 3.0000 mL | INTRAVENOUS | Status: DC | PRN

## 2016-02-12 MED ORDER — ALBUTEROL SULFATE (2.5 MG/3ML) 0.083% IN NEBU: 2.5000 mg | INHALATION_SOLUTION | RESPIRATORY_TRACT | Status: DC | PRN

## 2016-02-12 MED ORDER — POLYETHYLENE GLYCOL 3350 17 G PO PACK: 17.0000 g | PACK | Freq: Every day | ORAL | Status: DC | PRN

## 2016-02-12 MED ORDER — SODIUM CHLORIDE 0.9 % IV BOLUS (SEPSIS)
1000.0000 mL | Freq: Once | INTRAVENOUS | Status: AC
Start: 2016-02-12 — End: 2016-02-12
  Administered 2016-02-12: 1000 mL via INTRAVENOUS

## 2016-02-12 MED ORDER — ACETAMINOPHEN 650 MG RE SUPP: 650.0000 mg | Freq: Four times a day (QID) | RECTAL | Status: DC | PRN

## 2016-02-12 MED ORDER — SODIUM CHLORIDE 0.9 % IV BOLUS (SEPSIS)
1000.0000 mL | Freq: Once | INTRAVENOUS | Status: AC
  Administered 2016-02-12: 1000 mL via INTRAVENOUS

## 2016-02-12 MED ORDER — DOCUSATE SODIUM 100 MG PO CAPS
100.0000 mg | ORAL_CAPSULE | Freq: Two times a day (BID) | ORAL | Status: DC
  Administered 2016-02-12 – 2016-02-15 (×6): 100 mg via ORAL
  Filled 2016-02-12 (×6): qty 1

## 2016-02-12 MED ORDER — SODIUM CHLORIDE 0.9% FLUSH
3.0000 mL | Freq: Two times a day (BID) | INTRAVENOUS | Status: DC
  Administered 2016-02-12 – 2016-02-15 (×6): 3 mL via INTRAVENOUS

## 2016-02-12 NOTE — Progress Notes (Signed)
MD paged about lactic acid of 2.3

## 2016-02-12 NOTE — ED Notes (Signed)
Pt preferred IV access vs accessing port.

## 2016-02-12 NOTE — Telephone Encounter (Signed)
Concerned about the fact that he cannot get his words out, his speech is not right, has numbness of the left hand. Also reports that he is not eating. Please advise.

## 2016-02-12 NOTE — Telephone Encounter (Signed)
Advised to take patient to ER, she stated she is going to shower him first then take him

## 2016-02-12 NOTE — H&P (Signed)
[facility] [place] NAME: Sean Hall    MR#:  [id number]  DATE OF BIRTH:  1950-10-22  DATE OF ADMISSION:  02/12/2016  PRIMARY CARE PHYSICIAN: Lloyd Huger, MD   REQUESTING/REFERRING PHYSICIAN: Dr. Joni Fears  CHIEF COMPLAINT:   Chief Complaint  Patient presents with  . Headache  . Altered Mental Status  . Arm Pain    HISTORY OF PRESENT ILLNESS:  Sean Hall  is a 65 y.o. male with a known history of Lung cancer, hypertension presents to the emergency room due to worsening weakness of his right upper extremity. Patient had an MRI of his cervical spine yesterday which showed extension of his lung cancer paravertebrally but no impingement of nerves. MRI of the brachial plexus was recommended with and without contrast. Today in the emergency room patient had a CT scan of the head which shows new metastases bilaterally with surrounding edema. Admitted for IV steroids. No other focal numbness or weakness.Marland Kitchen   PAST MEDICAL HISTORY:   Past Medical History:  Diagnosis Date  . Arthritis   . Atrial fibrillation ([place])   . Cancer ([place]) 2017   Lung  . CHF (congestive heart failure) ([place])   . Chronic kidney disease   . COPD (chronic obstructive pulmonary disease) ([place])   . Disease of lung   . DVT (deep venous thrombosis) ([facility])    left leg  . Dysrhythmia   . Headache   . History of chemotherapy   . History of radiation therapy   . Hypertension   . Pneumonia     PAST SURGICAL HISTORY:   Past Surgical History:  Procedure Laterality Date  . KNEE SURGERY Bilateral   . PERIPHERAL VASCULAR CATHETERIZATION Left 11/11/2015   Procedure: Thrombectomy;  Surgeon: Katha Cabal, MD;  Location: [facility];  Service: Cardiovascular;  Laterality: Left;  . PORTACATH PLACEMENT N/A 10/29/2015   Procedure: INSERTION PORT-A-CATH;  Surgeon: Nestor Lewandowsky, MD;  Location: [facility];  Service: General;  Laterality: N/A;  .  TONSILLECTOMY      SOCIAL HISTORY:   Social History  Substance Use Topics  . Smoking status: Former Smoker    Packs/day: 2.00    Years: 50.00    Types: Cigarettes    Quit date: 08/07/2013  . Smokeless tobacco: Never Used  . Alcohol use 0.0 oz/week     Comment: rarely    FAMILY HISTORY:   Family History  Problem Relation Age of Onset  . Diabetes Father   . Hypertension Father     DRUG ALLERGIES:   Allergies  Allergen Reactions  . Aleve [Naproxen Sodium] Hives, Shortness Of Breath and Swelling    Naproxin    REVIEW OF SYSTEMS:   Review of Systems  Constitutional: Positive for malaise/fatigue. Negative for chills, fever and weight loss.  HENT: Negative for hearing loss and nosebleeds.   Eyes: Negative for blurred vision, double vision and pain.  Respiratory: Negative for cough, hemoptysis, sputum production, shortness of breath and wheezing.   Cardiovascular: Negative for chest pain, palpitations, orthopnea and leg swelling.  Gastrointestinal: Negative for abdominal pain, constipation, diarrhea, nausea and vomiting.  Genitourinary: Negative for dysuria and hematuria.  Musculoskeletal: Positive for joint pain. Negative for back pain, falls and myalgias.  Skin: Negative for rash.  Neurological: Positive for sensory change and focal weakness. Negative for dizziness, tremors, speech change, seizures and headaches.  Endo/Heme/Allergies: Does not bruise/bleed easily.  Psychiatric/Behavioral: Negative for depression and memory loss. The  patient is not nervous/anxious.     MEDICATIONS AT HOME:   Prior to Admission medications   Medication Sig Start Date End Date Taking? Authorizing Provider  albuterol (PROAIR HFA) 108 (90 Base) MCG/ACT inhaler Inhale 1-2 puffs into the lungs every 4 (four) hours as needed. 11/18/15   Lloyd Huger, MD  apixaban (ELIQUIS) 5 MG TABS tablet Take 1 tablet (5 mg total) by mouth 2 (two) times daily. 11/30/15   Lloyd Huger, MD   cefUROXime (CEFTIN) 500 MG tablet Take 1 tablet (500 mg total) by mouth every 12 (twelve) hours. 02/08/16   Fritzi Mandes, MD  lidocaine-prilocaine (EMLA) cream Apply to affected area once 10/28/15   Lloyd Huger, MD  Multiple Vitamin (MULTI-VITAMINS) TABS Take 1 tablet by mouth daily as needed.     Historical Provider, MD  ondansetron (ZOFRAN) 8 MG tablet Take 1 tablet (8 mg total) by mouth 2 (two) times daily as needed for refractory nausea / vomiting. Start on day 3 after chemo. 12/09/15   Lloyd Huger, MD  Oxycodone HCl 10 MG TABS Take 1 tablet (10 mg total) by mouth every 6 (six) hours as needed. Patient not taking: Reported on 02/06/2016 11/18/15   Lloyd Huger, MD  prochlorperazine (COMPAZINE) 10 MG tablet Take 1 tablet (10 mg total) by mouth every 6 (six) hours as needed (Nausea or vomiting). 10/20/15   Lloyd Huger, MD     VITAL SIGNS:  Blood pressure (!) 185/98, pulse 61, temperature 98 F (36.7 C), temperature source Oral, resp. rate (!) 23, height '6\' 3"'$  (1.905 m), weight 70.3 kg (155 lb), SpO2 97 %.  PHYSICAL EXAMINATION:  Physical Exam  GENERAL:  65 y.o.-year-old patient lying in the bed with no acute distress.  EYES: Pupils equal, round, reactive to light and accommodation. No scleral icterus. Extraocular muscles intact.  HEENT: Head atraumatic, normocephalic. Oropharynx and nasopharynx clear. No oropharyngeal erythema, moist oral mucosa  NECK:  Supple, no jugular venous distention. No thyroid enlargement, no tenderness.  LUNGS: Normal breath sounds bilaterally, no wheezing, rales, rhonchi. No use of accessory muscles of respiration.  CARDIOVASCULAR: S1, S2 normal. No murmurs, rubs, or gallops.  ABDOMEN: Soft, nontender, nondistended. Bowel sounds present. No organomegaly or mass.  EXTREMITIES: No pedal edema, cyanosis, or clubbing. + 2 pedal & radial pulses b/l.   NEUROLOGIC: Cranial nerves II through XII are intact.  Right upper extremity weakness and numbness   PSYCHIATRIC: The patient is alert and oriented x 3. Good affect.  SKIN: No obvious rash, lesion, or ulcer.   LABORATORY PANEL:   CBC  Recent Labs Lab 02/12/16 1304  WBC 17.8*  HGB 12.3*  HCT 35.8*  PLT 234   ------------------------------------------------------------------------------------------------------------------  Chemistries   Recent Labs Lab 02/06/16 1339  02/12/16 1304  NA 131*  < > 133*  K 3.0*  < > 3.7  CL 95*  < > 97*  CO2 25  < > 26  GLUCOSE 205*  < > 146*  BUN 12  < > 15  CREATININE 1.07  < > 1.01  CALCIUM 9.0  < > 9.3  MG 1.6*  --   --   AST 19  --  18  ALT 16*  --  9*  ALKPHOS 137*  --  131*  BILITOT 0.4  --  0.3  < > = values in this interval not displayed. ------------------------------------------------------------------------------------------------------------------  Cardiac Enzymes No results for input(s): TROPONINI in the last 168 hours. ------------------------------------------------------------------------------------------------------------------  RADIOLOGY:  Ct  Head Wo Contrast  Result Date: 02/12/2016 CLINICAL DATA:  Headache, right arm pain. EXAM: CT HEAD WITHOUT CONTRAST TECHNIQUE: Contiguous axial images were obtained from the base of the skull through the vertex without intravenous contrast. COMPARISON:  MRI of October 19, 2015. FINDINGS: Brain: Large area of white matter edema is seen in the left parietal cortex concerning for underlying metastatic disease. Similar abnormality is seen in both frontal lobes, as well as left cerebellar hemisphere. No hemorrhage is noted. Ventricular size is within normal limits. No significant midline shift is noted at this time. Vascular: Atherosclerosis of internal carotid arteries is noted. Skull: Bony calvarium appears intact. Sinuses/Orbits: Visualized paranasal sinuses appear normal. Other: None. IMPRESSION: Multiple areas of white matter edema are noted, including the left parietal cortex, both  frontal lobes and left cerebellar hemisphere. This is concerning for underlying metastatic disease, and further evaluation with MRI with and without gadolinium is recommended. Electronically Signed   By: Marijo Conception, M.D.   On: 02/12/2016 15:41   Mr Cervical Spine W Wo Contrast  Result Date: 02/11/2016 CLINICAL DATA:  RIGHT hand weakness and numbness. Symptoms for 2 weeks. No injury. Lung cancer. EXAM: MRI CERVICAL SPINE WITHOUT AND WITH CONTRAST TECHNIQUE: Multiplanar and multiecho pulse sequences of the cervical spine, to include the craniocervical junction and cervicothoracic junction, were obtained according to standard protocol without and with intravenous contrast. CONTRAST:  44m MULTIHANCE GADOBENATE DIMEGLUMINE 529 MG/ML IV SOLN COMPARISON:  PET scan 10/15/2015.  Chest radiograph 02/06/2016. FINDINGS: Alignment: Anatomic Vertebrae: Fatty replaced marrow at T1, T2, related to XRT. No definite tumor involvement. Cord: No cord compression or abnormal cord signal. Posterior Fossa: No visible tonsillar herniation. Vertebral Arteries: Are patent, LEFT dominant. Paraspinal tissues: Incompletely evaluated on this cervical spine MR is a suspected large RIGHT paravertebral mass, which would correspond to regional spread of tumor at the RIGHT lung apex. See sagittal image 1, arrows, multiple series. This area is not covered with the provided axial images. Concern is raised for brachial plexus involvement given the symptoms of RIGHT hand weakness and numbness. Disc levels: C2-3:  Normal. C3-4: Mild facet arthropathy. Slight LEFT-sided uncinate spurring. No C4 foraminal narrowing. C4-5: Shallow central protrusion. Mild facet arthropathy. LEFT-sided uncinate spurring. LEFT C5 foraminal narrowing. C5-6:  Annular bulge.  No impingement. C6-7: Central protrusion extends more to the LEFT than RIGHT. Superimposed uncinate spurring and asymmetric LEFT-sided facet arthropathy. LEFT C7 foraminal narrowing. C7-T1:   Unremarkable. IMPRESSION: Mild cervical spondylosis, without significant RIGHT-sided neural impingement. Suspected RIGHT paravertebral mass, incompletely evaluated, consistent with regional involvement by the patient's known RIGHT lung cancer. Recommend MRI of the brachial plexus without and with contrast for further evaluation, which could be performed anytime after 10 a.m. on 02/13/2016 to allow sufficient gadolinium elimination. Electronically Signed   By: JStaci RighterM.D.   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 Possible involvement of right brachial plexus. Admit and start on IV Decadron 10 mg every 6 hours. Check MRI of the brain and of the brachial plexus with without contrast. Consult oncology.  * Hypertension Continue medications  * History of DVT on Eliquis  All the records are reviewed and case discussed with ED provider. Management plans discussed with the patient, family and they are in agreement.  CODE STATUS: DNR  TOTAL TIME TAKING CARE OF THIS PATIENT: 40 minutes.   SHillary BowR M.D on 02/12/2016  at 6:04 PM  Between 7am to 6pm - Pager - [phone]  After 6pm go to [email or web address] - password [email or web address] [facility]  Office  [phone]  CC: Primary care physician; Lloyd Huger, MD  Note: This dictation was prepared with Dragon dictation along with smaller phrase technology. Any transcriptional errors that result from this process are unintentional.

## 2016-02-12 NOTE — ED Notes (Signed)
Called lab to ask about lactic acid, told there was a problem with the machine and is running now.

## 2016-02-12 NOTE — ED Triage Notes (Signed)
Pt arrives today from home after being told by Dr Grayland Ormond to come here today to be seen - pt was discharged from the hospital on Sunday and he had an MRI on his right arm yesterday due to pain in the arm and not being able to move it like normal    Pt reports right sided arm pain and headache pain  Spouse reports confusion for greater than one week   8/10 arm pain at present

## 2016-02-12 NOTE — ED Provider Notes (Signed)
[facility] Emergency Department Provider Note  ____________________________________________  Time seen: Approximately 4:18 PM  I have reviewed the triage vital signs and the nursing notes.   HISTORY  Chief Complaint Headache; Altered Mental Status; and Arm Pain    HPI Sean Hall is a 66 y.o. male who complains of headache right arm pain and weakness for the past several days, possibly up to a week. Gradual onset, worsening. Additional history obtained from his wife at bedside who also reports that he has been having a difficulty time with word selection and expressing himself, sometimes stumbling over words or unable to think of the correct one Other family members have noticed this as well. No recent trauma.Has had some occasional vomiting and diarrhea and decreased oral intake which she relates to his double dose chemotherapy that he did 2 weeks ago. He has known lung cancer. He had an MRI of his C-spine yesterday which showed that he has a paravertebral mass concerning for regional metastasis.  Denies changes in balance but his wife reports that she thinks she has been a little bit unsteady on his feet. Denies vision changes. No lower extremity weakness. No fevers chills.     Past Medical History:  Diagnosis Date  . Arthritis   . Atrial fibrillation ([place])   . Cancer ([place]) 2017   Lung  . CHF (congestive heart failure) ([place])   . Chronic kidney disease   . COPD (chronic obstructive pulmonary disease) ([place])   . Disease of lung   . DVT (deep venous thrombosis) ([facility])    left leg  . Dysrhythmia   . Headache   . History of chemotherapy   . History of radiation therapy   . Hypertension   . Pneumonia      Patient Active Problem List   Diagnosis Date Noted  . Pneumonia 02/06/2016  . DVT (deep venous thrombosis), left 11/11/2015  . DVT (deep venous thrombosis) ([place]) 11/07/2015  . Malignant neoplasm of right upper lobe of lung ([place]) 10/17/2015  .  Disease of lung 11/25/2013  . BP (high blood pressure) 11/25/2013  . Atrial fibrillation ([place]) 11/22/2013  . Congestive heart failure ([place]) 11/22/2013  . Chronic kidney disease 11/22/2013  . Chronic obstructive pulmonary disease ([place]) 11/22/2013  . PNA (pneumonia) 11/22/2013     Past Surgical History:  Procedure Laterality Date  . KNEE SURGERY Bilateral   . PERIPHERAL VASCULAR CATHETERIZATION Left 11/11/2015   Procedure: Thrombectomy;  Surgeon: Katha Cabal, MD;  Location: [place] [facility] [facility];  Service: Cardiovascular;  Laterality: Left;  . PORTACATH PLACEMENT N/A 10/29/2015   Procedure: INSERTION PORT-A-CATH;  Surgeon: Nestor Lewandowsky, MD;  Location: [facility];  Service: General;  Laterality: N/A;  . TONSILLECTOMY       Prior to Admission medications   Medication Sig Start Date End Date Taking? Authorizing Provider  albuterol (PROAIR HFA) 108 (90 Base) MCG/ACT inhaler Inhale 1-2 puffs into the lungs every 4 (four) hours as needed. 11/18/15   Lloyd Huger, MD  apixaban (ELIQUIS) 5 MG TABS tablet Take 1 tablet (5 mg total) by mouth 2 (two) times daily. 11/30/15   Lloyd Huger, MD  cefUROXime (CEFTIN) 500 MG tablet Take 1 tablet (500 mg total) by mouth every 12 (twelve) hours. 02/08/16   Fritzi Mandes, MD  lidocaine-prilocaine (EMLA) cream Apply to affected area once 10/28/15   Lloyd Huger, MD  Multiple Vitamin (MULTI-VITAMINS) TABS Take 1 tablet by mouth daily as needed.     Historical  Provider, MD  ondansetron (ZOFRAN) 8 MG tablet Take 1 tablet (8 mg total) by mouth 2 (two) times daily as needed for refractory nausea / vomiting. Start on day 3 after chemo. 12/09/15   Lloyd Huger, MD  Oxycodone HCl 10 MG TABS Take 1 tablet (10 mg total) by mouth every 6 (six) hours as needed. Patient not taking: Reported on 02/06/2016 11/18/15   Lloyd Huger, MD  prochlorperazine (COMPAZINE) 10 MG tablet Take 1 tablet (10 mg total) by mouth every 6 (six) hours as needed (Nausea or  vomiting). 10/20/15   Lloyd Huger, MD     Allergies Aleve [naproxen sodium]   Family History  Problem Relation Age of Onset  . Diabetes Father   . Hypertension Father     Social History Social History  Substance Use Topics  . Smoking status: Former Smoker    Packs/day: 2.00    Years: 50.00    Types: Cigarettes    Quit date: 08/07/2013  . Smokeless tobacco: Never Used  . Alcohol use 0.0 oz/week     Comment: rarely    Review of Systems  Constitutional:   No fever or chills.  ENT:   No sore throat. No rhinorrhea.o recent illness Cardiovascular:   No chest pain. Respiratory:   No dyspnea or cough. Gastrointestinal:   Negative for abdominal pain, ccasional vomiting and diarrhea.  Genitourinary:   Negative for dysuria or difficulty urinating. Musculoskeletal:  subacute right calf pain, known superficial phlebitis. Neurological:   positive left-sided headachees 10-point ROS otherwise negative.  ____________________________________________   PHYSICAL EXAM:  VITAL SIGNS: ED Triage Vitals  Enc Vitals Group     BP 02/12/16 1255 (!) 152/100     Pulse Rate 02/12/16 1255 61     Resp 02/12/16 1255 18     Temp 02/12/16 1255 98 F (36.7 C)     Temp Source 02/12/16 1255 Oral     SpO2 02/12/16 1255 98 %     Weight 02/12/16 1256 155 lb (70.3 kg)     Height 02/12/16 1256 '6\' 3"'$  (1.905 m)     Head Circumference --      Peak Flow --      Pain Score 02/12/16 1256 8     Pain Loc --      Pain Edu? --      Excl. in [place]? --     Vital signs reviewed, nursing assessments reviewed.   Constitutional:   Alert and oriented. Well appearing and in no distress. Eyes:   No scleral icterus. No conjunctival pallor. PERRL. EOMI.  No nystagmus. ENT   Head:   Normocephalic and atraumatic.   Nose:   No congestion/rhinnorhea. No septal hematoma   Mouth/Throat:   MMM, no pharyngeal erythema. No peritonsillar mass.    Neck:   No stridor. No SubQ emphysema. No meningismus.No  neck mass or tenderness. Hematological/Lymphatic/Immunilogical:   No cervical lymphadenopathy. Cardiovascular:   Tachycardia heart rate 110. Symmetric bilateral radial and DP pulses.  No murmurs.  Respiratory:   Normal respiratory effort without tachypnea nor retractions. Breath sounds are clear and equal bilaterally. No wheezes/rales/rhonchi. Gastrointestinal:   Soft and nontender. Non distended. There is no CVA tenderness.  No rebound, rigidity, or guarding. Genitourinary:   deferred Musculoskeletal:   Right medial calf tenderness in discrete areas of erythema and palpable superficial phlebitis. Patient using topical analgesic.Marland Kitchen Neurologic:   Normal speech and language.  CN 2-10 normal. Diffuse weakness of all muscle groups in the right  upper extremity. Right lower extremity is full strength and symmetric. No gross focal neurologic deficits are appreciated.  Skin:    Skin is warm, dry and intact. No rash noted.  No petechiae, purpura, or bullae.  ____________________________________________    LABS (pertinent positives/negatives) (all labs ordered are listed, but only abnormal results are displayed) Labs Reviewed  COMPREHENSIVE METABOLIC PANEL - Abnormal; Notable for the following:       Result Value   Sodium 133 (*)    Chloride 97 (*)    Glucose, Bld 146 (*)    Albumin 3.3 (*)    ALT 9 (*)    Alkaline Phosphatase 131 (*)    All other components within normal limits  CBC - Abnormal; Notable for the following:    WBC 17.8 (*)    RBC 4.39 (*)    Hemoglobin 12.3 (*)    HCT 35.8 (*)    RDW 20.7 (*)    All other components within normal limits  URINALYSIS COMPLETEWITH MICROSCOPIC ([facility] ONLY) - Abnormal; Notable for the following:    Color, Urine COLORLESS (*)    APPearance CLEAR (*)    Specific Gravity, Urine 1.004 (*)    Hgb urine dipstick 1+ (*)    All other components within normal limits  URINE CULTURE  CULTURE, BLOOD (ROUTINE X 2)  CULTURE, BLOOD (ROUTINE X 2)  LACTIC  ACID, PLASMA  LACTIC ACID, PLASMA   ____________________________________________   EKG  Interpreted by me Sinus tachycardia rate 123, normal axis and intervals. Poor R-wave progression in anterior precordial leads. Normal ST segments and T waves.  ____________________________________________    RADIOLOGY  CT head shows multiple areas of white matter edema including left parietal cortex and frontal lobes which corresponds to the patient's neurologic deficit. No midline shift  ____________________________________________   PROCEDURES Procedures  ____________________________________________   INITIAL IMPRESSION / ASSESSMENT AND PLAN / ED COURSE  Pertinent labs & imaging results that were available during my care of the patient were reviewed by me and considered in my medical decision making (see chart for details).  Patient presents with right arm weakness and numbness and some mild aphasia and confusion. CT head from today and MR cervical spine that were performed yesterday are consistent with metastatic disease involving the CNS. Low suspicion for meningitis or encephalitis given the patient is afebrile and these findings which do correspond to his symptoms. Give the patient IV Decadron and continue IV fluids for hydration. Initial heart rate in triage was 123. On my exam is 110, and he does appear to be improving and is not septic. We'll defer antimicrobials unless he has a change in clinical condition.     Clinical Course  Comment By Time  HR 110 on exam.  Carrie Mew, MD 10/06 1558   _____________  ----------------------------------------- 5:46 PM on 02/12/2016 -----------------------------------------  Heart rate 95. Patient tolerating oral intake. Persistent right upper extremity deficit. Workup reveals cerebral edema, concerning for metastases to the brain causing these neurologic deficits. Case discussed with Dr. Mike Gip who agrees with scheduled Decadron,  will consult on the patient tomorrow. Discussed with hospitalist for further management  . _______________________________   FINAL CLINICAL IMPRESSION(S) / ED DIAGNOSES  Final diagnoses:  Cerebral edema (HCC)  Weakness of right upper extremity  Dehydration       Portions of this note were generated with dragon dictation software. Dictation errors may occur despite best attempts at proofreading.    Carrie Mew, MD 02/12/16 [id number]

## 2016-02-12 NOTE — Telephone Encounter (Signed)
Per Dr. Grayland Ormond, pt needs to go to the ED for further evaluation

## 2016-02-13 ENCOUNTER — Inpatient Hospital Stay: Payer: Medicaid Other

## 2016-02-13 DIAGNOSIS — M479 Spondylosis, unspecified: Secondary | ICD-10-CM

## 2016-02-13 DIAGNOSIS — Z9221 Personal history of antineoplastic chemotherapy: Secondary | ICD-10-CM

## 2016-02-13 DIAGNOSIS — C3411 Malignant neoplasm of upper lobe, right bronchus or lung: Secondary | ICD-10-CM

## 2016-02-13 DIAGNOSIS — I129 Hypertensive chronic kidney disease with stage 1 through stage 4 chronic kidney disease, or unspecified chronic kidney disease: Secondary | ICD-10-CM

## 2016-02-13 DIAGNOSIS — J449 Chronic obstructive pulmonary disease, unspecified: Secondary | ICD-10-CM

## 2016-02-13 DIAGNOSIS — C7931 Secondary malignant neoplasm of brain: Principal | ICD-10-CM

## 2016-02-13 DIAGNOSIS — Z87891 Personal history of nicotine dependence: Secondary | ICD-10-CM

## 2016-02-13 DIAGNOSIS — Z66 Do not resuscitate: Secondary | ICD-10-CM

## 2016-02-13 DIAGNOSIS — R4182 Altered mental status, unspecified: Secondary | ICD-10-CM

## 2016-02-13 DIAGNOSIS — N189 Chronic kidney disease, unspecified: Secondary | ICD-10-CM

## 2016-02-13 DIAGNOSIS — Z8701 Personal history of pneumonia (recurrent): Secondary | ICD-10-CM

## 2016-02-13 DIAGNOSIS — Z923 Personal history of irradiation: Secondary | ICD-10-CM

## 2016-02-13 DIAGNOSIS — Z79899 Other long term (current) drug therapy: Secondary | ICD-10-CM

## 2016-02-13 DIAGNOSIS — I4891 Unspecified atrial fibrillation: Secondary | ICD-10-CM

## 2016-02-13 DIAGNOSIS — M129 Arthropathy, unspecified: Secondary | ICD-10-CM

## 2016-02-13 DIAGNOSIS — Z86718 Personal history of other venous thrombosis and embolism: Secondary | ICD-10-CM

## 2016-02-13 LAB — CBC
HCT: 32.9 % — ABNORMAL LOW (ref 40.0–52.0)
HEMOGLOBIN: 11.1 g/dL — AB (ref 13.0–18.0)
MCH: 27.7 pg (ref 26.0–34.0)
MCHC: 33.9 g/dL (ref 32.0–36.0)
MCV: 81.8 fL (ref 80.0–100.0)
PLATELETS: 216 10*3/uL (ref 150–440)
RBC: 4.02 MIL/uL — AB (ref 4.40–5.90)
RDW: 20.6 % — ABNORMAL HIGH (ref 11.5–14.5)
WBC: 22.5 10*3/uL — AB (ref 3.8–10.6)

## 2016-02-13 LAB — BASIC METABOLIC PANEL
ANION GAP: 9 (ref 5–15)
BUN: 17 mg/dL (ref 6–20)
CALCIUM: 9 mg/dL (ref 8.9–10.3)
CO2: 25 mmol/L (ref 22–32)
CREATININE: 1.04 mg/dL (ref 0.61–1.24)
Chloride: 102 mmol/L (ref 101–111)
GFR calc non Af Amer: >60 mL/min (ref >60)
Glucose, Bld: 168 mg/dL — ABNORMAL HIGH (ref 65–99)
Potassium: 4.2 mmol/L (ref 3.5–5.1)
Sodium: 136 mmol/L (ref 135–145)

## 2016-02-13 LAB — LACTIC ACID, PLASMA: LACTIC ACID, VENOUS: 2.3 mmol/L — AB (ref 0.5–1.9)

## 2016-02-13 LAB — URINE CULTURE: CULTURE: NO GROWTH

## 2016-02-13 MED ORDER — DEXAMETHASONE SODIUM PHOSPHATE 10 MG/ML IJ SOLN
4.0000 mg | Freq: Four times a day (QID) | INTRAMUSCULAR | Status: DC
  Administered 2016-02-13 – 2016-02-15 (×8): 4 mg via INTRAVENOUS
  Filled 2016-02-13 (×8): qty 1

## 2016-02-13 MED ORDER — FENTANYL 12 MCG/HR TD PT72
12.5000 ug | MEDICATED_PATCH | TRANSDERMAL | Status: DC
  Administered 2016-02-13 (×2): 12.5 ug via TRANSDERMAL
  Filled 2016-02-13 (×2): qty 1

## 2016-02-13 MED ORDER — HYDRALAZINE HCL 20 MG/ML IJ SOLN
10.0000 mg | Freq: Four times a day (QID) | INTRAMUSCULAR | Status: DC | PRN
  Administered 2016-02-13: 10 mg via INTRAVENOUS
  Filled 2016-02-13: qty 1

## 2016-02-13 MED ORDER — GADOBENATE DIMEGLUMINE 529 MG/ML IV SOLN
15.0000 mL | Freq: Once | INTRAVENOUS | Status: AC | PRN
  Administered 2016-02-13: 12:00:00 15 mL via INTRAVENOUS

## 2016-02-13 NOTE — Consult Note (Addendum)
[facility]  Date of admission:  02/12/2016  Inpatient day:  02/13/2016  Consulting physician: Dr. Abel Presto  Reason for Consultation:  Brain mets  Chief Complaint: Sean Hall is a 65 y.o. 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  HPI:  He was diagnosed on 10/02/2015 with lung cancer.  PET scan on 10/15/2015 revealed a large right upper lobe lung mass consistent with known neoplasm.  There was mediastinal and hilar lymphadenopathy.  There were no findings for metastatic disease involving the lungs, neck, abdomen/pelvis or osseous structures.  Head MRI on 10/19/2015 revealed no evidence of metastatic disease.  He received concurrent chemotherapy (weekly carboplatin and Taxol) with radiation.  Radiation completed on 01/04/2016.  He received cycle #1 of 2 consolidative full dose carboplatin and Taxol on 01/27/2016 with Neulasta support.  He noted right upper arm weakness.  A cervical spine MRI was ordered.  The patient was admitted to [facility] from 02/06/2016 - 02/08/2016 with sepsis due to pneumonia.  He was treated with Cefepime and discharged on cefuroxime.  Right lower extremity duplex revealed superficial occlusive thrombus in the right greater saphenous vein.  There was partially occlusive DVT in the left common femoral vein (slightly improved since 11/07/2015).  He was Eliquis for an extensive DVT of the left lower extremity diagnosed in 11/2015.  He underwent thrombectomy on 11/11/2015.  Cervical spine MRI on 02/11/2016 revealed mild cervical spondylosis, without significant RIGHT-sided neural impingement.  There was suspected RIGHT paravertebral mass, incompletely evaluated, consistent with regional involvement by the patient's known RIGHT lung cancer. Recommendation was for MRI of the brachial plexus    He presented to the ER with headache, right arm pain and weakness, and word finding difficulty.  Head CT without contrast revealed multiple areas of white matter edema, including the left parietal cortex, both frontal lobes and left cerebellar hemisphere. Imaging wais concerning for underlying metastatic disease, and further evaluation with MRI with and without gadolinium was recommended.  He was started to Decadron 10 mg q 6 hours.   Past Medical History:  Diagnosis Date  . Arthritis   . Atrial fibrillation ([place])   . Cancer ([place]) 2017   Lung  . CHF (congestive heart failure) ([place])   . Chronic kidney disease   . COPD (chronic obstructive pulmonary disease) ([place])   . Disease of lung   . DVT (deep venous thrombosis) ([facility])    left leg  . Dysrhythmia   . Headache   . History of chemotherapy   . History of radiation therapy   . Hypertension   . Pneumonia     Past Surgical History:  Procedure Laterality Date  . KNEE SURGERY Bilateral   . PERIPHERAL VASCULAR CATHETERIZATION Left 11/11/2015   Procedure: Thrombectomy;  Surgeon: Katha Cabal, MD;  Location: [place] [facility];  Service: Cardiovascular;  Laterality: Left;  . PORTACATH PLACEMENT N/A 10/29/2015   Procedure: INSERTION PORT-A-CATH;  Surgeon: Nestor Lewandowsky, MD;  Location: [facility] ORS;  Service: General;  Laterality: N/A;  . TONSILLECTOMY      Family History  Problem Relation Age of Onset  . Diabetes Father   . Hypertension Father     Social History:  reports that he quit smoking about 2 years ago. His smoking use included Cigarettes. He has a 100.00 pack-year smoking history. He has never used smokeless tobacco. He reports  that he does not drink alcohol or use drugs.  The patient is accompanied by his girlfriend and his girlfriend's sister.  Allergies:  Allergies  Allergen Reactions  . Aleve [Naproxen Sodium] Hives, Shortness Of Breath and Swelling    Naproxin    Medications Prior to Admission  Medication Sig  Dispense Refill  . albuterol (PROAIR HFA) 108 (90 Base) MCG/ACT inhaler Inhale 1-2 puffs into the lungs every 4 (four) hours as needed. 1 Inhaler 3  . apixaban (ELIQUIS) 5 MG TABS tablet Take 1 tablet (5 mg total) by mouth 2 (two) times daily. 180 tablet 3  . cefUROXime (CEFTIN) 500 MG tablet Take 1 tablet (500 mg total) by mouth every 12 (twelve) hours. 16 tablet 0  . lidocaine-prilocaine (EMLA) cream Apply to affected area once 30 g 3  . Multiple Vitamin (MULTI-VITAMINS) TABS Take 1 tablet by mouth daily as needed.     . ondansetron (ZOFRAN) 8 MG tablet Take 1 tablet (8 mg total) by mouth 2 (two) times daily as needed for refractory nausea / vomiting. Start on day 3 after chemo. 30 tablet 1  . prochlorperazine (COMPAZINE) 10 MG tablet Take 1 tablet (10 mg total) by mouth every 6 (six) hours as needed (Nausea or vomiting). 30 tablet 1    Review of Systems: GENERAL:  Fatigue.  No fevers or sweats.  Feels cold since weight loss.  Significant weight loss prior to diagnosis. PERFORMANCE STATUS (ECOG):  2 HEENT:  No visual changes, runny nose, sore throat, mouth sores or tenderness. Lungs: No shortness of breath or cough.  No hemoptysis. Cardiac:  No chest pain, palpitations, orthopnea, or PND. GI:  Nausea, vomiting, and a little diarrhea.  No constipation, melena or hematochezia. GU:  No urgency, frequency, dysuria, or hematuria. Musculoskeletal:  No back pain.  No joint pain.  No muscle tenderness. Extremities:  No pain or swelling. Skin:  No rashes or skin changes. Neuro:  Headache.  Weakness right arm/hand.  No numbness, balance or coordination issues. Endocrine:  No diabetes, thyroid issues, hot flashes or night sweats. Psych:  No mood changes, depression or anxiety. Pain:  Pain, burt difficulty localizing. Review of systems:  All other systems reviewed and found to be negative.  Physical Exam:  Blood pressure (!) 152/80, pulse (!) 106, temperature 98.2 F (36.8 C), resp. rate 20,  height 6' 3"  (1.905 m), weight 164 lb 3.2 oz (74.5 kg), SpO2 91 %.  GENERAL:  Tall thin gentleman sitting comfortably on the medical unit in no acute distress. MENTAL STATUS:  Alert and oriented to person, place and time. HEAD:  Graying hair with beard.  Male pattern baldness.  Normocephalic, atraumatic, face symmetric, no Cushingoid features. EYES:  Pupils equal round and reactive to light and accomodation.  No conjunctivitis or scleral icterus. ENT:  Oropharynx clear without lesion.  Tongue normal. Mucous membranes moist.  RESPIRATORY:  Clear to auscultation without rales, wheezes or rhonchi. CARDIOVASCULAR:  Regular rate and rhythm without murmur, rub or gallop. ABDOMEN:  Soft, non-tender, with active bowel sounds, and no hepatosplenomegaly.  No masses. SKIN:  No rashes, ulcers or lesions. EXTREMITIES: No edema, no skin discoloration or tenderness.  No palpable cords. LYMPH NODES: No palpable cervical, supraclavicular, axillary or inguinal adenopathy  NEUROLOGICAL: Alert & oriented, word finding difficulty, difficulty understanding, cranial nerves II-XII intact except for numbness left forehead; motor strength 5/5 throughout except right upper extremity 4/5 with minimal grip strength; sensation intact; finger to nose difficult as can not understand multi-step commands;  able to walk heel to toe; negative Rhomberg; upper and lower extremity reflexes 2+; no clonus or Babinski.  PSYCH:  Appropriate.   Results for orders placed or performed during the hospital encounter of 02/12/16 (from the past 48 hour(s))  Comprehensive metabolic panel     Status: Abnormal   Collection Time: 02/12/16  1:04 PM  Result Value Ref Range   Sodium 133 (L) 135 - 145 mmol/L   Potassium 3.7 3.5 - 5.1 mmol/L   Chloride 97 (L) 101 - 111 mmol/L   CO2 26 22 - 32 mmol/L   Glucose, Bld 146 (H) 65 - 99 mg/dL   BUN 15 6 - 20 mg/dL   Creatinine, Ser 1.01 0.61 - 1.24 mg/dL   Calcium 9.3 8.9 - 10.3 mg/dL   Total Protein 7.9  6.5 - 8.1 g/dL   Albumin 3.3 (L) 3.5 - 5.0 g/dL   AST 18 15 - 41 U/L   ALT 9 (L) 17 - 63 U/L   Alkaline Phosphatase 131 (H) 38 - 126 U/L   Total Bilirubin 0.3 0.3 - 1.2 mg/dL   GFR calc non Af Amer >60 >60 mL/min   GFR calc Af Amer >60 >60 mL/min    Comment: (NOTE) The eGFR has been calculated using the CKD EPI equation. This calculation has not been validated in all clinical situations. eGFR's persistently <60 mL/min signify possible Chronic Kidney Disease.    Anion gap 10 5 - 15  CBC     Status: Abnormal   Collection Time: 02/12/16  1:04 PM  Result Value Ref Range   WBC 17.8 (H) 3.8 - 10.6 K/uL   RBC 4.39 (L) 4.40 - 5.90 MIL/uL   Hemoglobin 12.3 (L) 13.0 - 18.0 g/dL   HCT 35.8 (L) 40.0 - 52.0 %   MCV 81.6 80.0 - 100.0 fL   MCH 27.9 26.0 - 34.0 pg   MCHC 34.2 32.0 - 36.0 g/dL   RDW 20.7 (H) 11.5 - 14.5 %   Platelets 234 150 - 440 K/uL  Urinalysis complete, with microscopic     Status: Abnormal   Collection Time: 02/12/16  5:14 PM  Result Value Ref Range   Color, Urine COLORLESS (A) YELLOW   APPearance CLEAR (A) CLEAR   Glucose, UA NEGATIVE NEGATIVE mg/dL   Bilirubin Urine NEGATIVE NEGATIVE   Ketones, ur NEGATIVE NEGATIVE mg/dL   Specific Gravity, Urine 1.004 (L) 1.005 - 1.030   Hgb urine dipstick 1+ (A) NEGATIVE   pH 7.0 5.0 - 8.0   Protein, ur NEGATIVE NEGATIVE mg/dL   Nitrite NEGATIVE NEGATIVE   Leukocytes, UA NEGATIVE NEGATIVE   RBC / HPF 0-5 0 - 5 RBC/hpf   WBC, UA 0-5 0 - 5 WBC/hpf   Bacteria, UA NONE SEEN NONE SEEN   Squamous Epithelial / LPF NONE SEEN NONE SEEN  Lactic acid, plasma     Status: None   Collection Time: 02/12/16  5:14 PM  Result Value Ref Range   Lactic Acid, Venous 1.7 0.5 - 1.9 mmol/L  Lactic acid, plasma     Status: Abnormal   Collection Time: 02/12/16  8:33 PM  Result Value Ref Range   Lactic Acid, Venous 2.3 (HH) 0.5 - 1.9 mmol/L    Comment: CRITICAL RESULT CALLED TO, READ BACK BY AND VERIFIED WITH CYNTHIA BURGESS AT 2109 02/12/2016 BY  TFK.   Basic metabolic panel     Status: Abnormal   Collection Time: 02/13/16  2:25 AM  Result Value Ref Range  Sodium 136 135 - 145 mmol/L   Potassium 4.2 3.5 - 5.1 mmol/L   Chloride 102 101 - 111 mmol/L   CO2 25 22 - 32 mmol/L   Glucose, Bld 168 (H) 65 - 99 mg/dL   BUN 17 6 - 20 mg/dL   Creatinine, Ser 1.04 0.61 - 1.24 mg/dL   Calcium 9.0 8.9 - 10.3 mg/dL   GFR calc non Af Amer >60 >60 mL/min   GFR calc Af Amer >60 >60 mL/min    Comment: (NOTE) The eGFR has been calculated using the CKD EPI equation. This calculation has not been validated in all clinical situations. eGFR's persistently <60 mL/min signify possible Chronic Kidney Disease.    Anion gap 9 5 - 15  CBC     Status: Abnormal   Collection Time: 02/13/16  2:25 AM  Result Value Ref Range   WBC 22.5 (H) 3.8 - 10.6 K/uL   RBC 4.02 (L) 4.40 - 5.90 MIL/uL   Hemoglobin 11.1 (L) 13.0 - 18.0 g/dL   HCT 32.9 (L) 40.0 - 52.0 %   MCV 81.8 80.0 - 100.0 fL   MCH 27.7 26.0 - 34.0 pg   MCHC 33.9 32.0 - 36.0 g/dL   RDW 20.6 (H) 11.5 - 14.5 %   Platelets 216 150 - 440 K/uL  Lactic acid, plasma     Status: Abnormal   Collection Time: 02/13/16  2:25 AM  Result Value Ref Range   Lactic Acid, Venous 2.3 (HH) 0.5 - 1.9 mmol/L    Comment: CRITICAL RESULT CALLED TO, READ BACK BY AND VERIFIED WITH CYNTHIA BURGESS AT 0306 02/13/16.PMH   Ct Head Wo Contrast  Result Date: 02/12/2016 CLINICAL DATA:  Headache, right arm pain. EXAM: CT HEAD WITHOUT CONTRAST TECHNIQUE: Contiguous axial images were obtained from the base of the skull through the vertex without intravenous contrast. COMPARISON:  MRI of October 19, 2015. FINDINGS: Brain: Large area of white matter edema is seen in the left parietal cortex concerning for underlying metastatic disease. Similar abnormality is seen in both frontal lobes, as well as left cerebellar hemisphere. No hemorrhage is noted. Ventricular size is within normal limits. No significant midline shift is noted at this  time. Vascular: Atherosclerosis of internal carotid arteries is noted. Skull: Bony calvarium appears intact. Sinuses/Orbits: Visualized paranasal sinuses appear normal. Other: None. IMPRESSION: Multiple areas of white matter edema are noted, including the left parietal cortex, both frontal lobes and left cerebellar hemisphere. This is concerning for underlying metastatic disease, and further evaluation with MRI with and without gadolinium is recommended. Electronically Signed   By: Marijo Conception, M.D.   On: 02/12/2016 15:41    Assessment:  The patient is a 65 y.o.  gentleman with stage IIIB poorly differentiated lung cancer who is admitted through the emergency room with altered mental status, right upper extremity weakness, and head CT suggestive of multiple brain metastasis.  He received concurrent weekly carboplatin and Taxol with radiation.  He is currently day 18 s/p cycle #1 full dose carboplatin and Taxol with Neulasta support.  Plan:   1.  Oncology:  Discussed with patient and family likely multi-focal brain metastasis.  Await head MRI today.  Discuss stage IV disease.  Treatment is palliative.  Discuss continuation of Decadron with plan for likely whole brain radiation if multiple lesions noted on imaging.  Discuss no chemotherapy next week (planned for 02/17/2016).  Anticipate repeat PET scan in outpatient depratment to restage systemic disease.  If systemic progression then anticipate  targeted therapy based on mutational analysis.  Decrease Decadron to 4 mg IV q 6 hours.  Consult Dr. Baruch Gouty on Monday, 02/15/2016.  2.  Hematology:  Patient on Eliquis for history of extensive DVT in left lower extremity in 11/2015.  WBC elevated likely secondary to Neulasta.    3.  Infectious disease:  Patient afebrile.  He was discharged on cefuroxime on 02/08/2016 for pneumonia.  Current imaging does not suggest pneumonia.  If diarrhea significant, considering checking C diff.  4.  Pain/Toxicology:   Patient unable to verbalize where he has pain.  He is now on a Fentanyl patch (12 mcg/hr) and hydrocodone/Tylenol every 4 hrs prn pain.  He currently does not appear to be in pain.  5.  Disposition:  Anticipate discharge on 02/15/2016 after evaluation by radiation oncology.  Code status is DNR.   Thank you for allowing me to participate in Sean Hall 's care.  I will follow him closely with you while hospitalized until Dr. Gary Fleet return on 02/15/2016.   Lequita Asal, MD  02/13/2016, 11:36 AM

## 2016-02-13 NOTE — Progress Notes (Signed)
[facility] [place] NAME: Sean Hall    MR#:  [id number]  DATE OF BIRTH:  Aug 10, 1950  SUBJECTIVE:   Pt. Here due to right arm/shoulder pain and weakness in the right arm.  CT head showing brain metastasis. Pt denies headache, N/V presently.  Still complaining of right arm pain.    REVIEW OF SYSTEMS:    Review of Systems  Constitutional: Negative for chills and fever.  HENT: Negative for congestion and tinnitus.   Eyes: Negative for blurred vision and double vision.  Respiratory: Negative for cough, shortness of breath and wheezing.   Cardiovascular: Negative for chest pain, orthopnea and PND.  Gastrointestinal: Negative for abdominal pain, diarrhea, nausea and vomiting.  Genitourinary: Negative for dysuria and hematuria.  Neurological: Positive for focal weakness (right arm). Negative for dizziness and sensory change.  All other systems reviewed and are negative.   Nutrition: Regular Tolerating Diet: Yes Tolerating PT: Await Eval.    DRUG ALLERGIES:   Allergies  Allergen Reactions  . Aleve [Naproxen Sodium] Hives, Shortness Of Breath and Swelling    Naproxin    VITALS:  Blood pressure (!) 152/80, pulse (!) 106, temperature 98.2 F (36.8 C), resp. rate 20, height '6\' 3"'$  (1.905 m), weight 74.5 kg (164 lb 3.2 oz), SpO2 91 %.  PHYSICAL EXAMINATION:   Physical Exam  GENERAL:  65 y.o.-year-old patient lying in the bed with in no acute distress.  EYES: Pupils equal, round, reactive to light and accommodation. No scleral icterus. Extraocular muscles intact.  HEENT: Head atraumatic, normocephalic. Oropharynx and nasopharynx clear.  NECK:  Supple, no jugular venous distention. No thyroid enlargement, no tenderness.  LUNGS: Normal breath sounds bilaterally, no wheezing, rales, rhonchi. No use of accessory muscles of respiration.  CARDIOVASCULAR: S1, S2 normal. No murmurs, rubs, or gallops.  ABDOMEN: Soft, nontender, nondistended.  Bowel sounds present. No organomegaly or mass.  EXTREMITIES: No cyanosis, clubbing or edema b/l.    NEUROLOGIC: Cranial nerves II through XII are intact.  Right upper Ext. Weakness.    PSYCHIATRIC: The patient is alert and oriented x 3.  SKIN: No obvious rash, lesion, or ulcer.    LABORATORY PANEL:   CBC  Recent Labs Lab 02/13/16 0225  WBC 22.5*  HGB 11.1*  HCT 32.9*  PLT 216   ------------------------------------------------------------------------------------------------------------------  Chemistries   Recent Labs Lab 02/06/16 1339  02/12/16 1304 02/13/16 0225  NA 131*  < > 133* 136  K 3.0*  < > 3.7 4.2  CL 95*  < > 97* 102  CO2 25  < > 26 25  GLUCOSE 205*  < > 146* 168*  BUN 12  < > 15 17  CREATININE 1.07  < > 1.01 1.04  CALCIUM 9.0  < > 9.3 9.0  MG 1.6*  --   --   --   AST 19  --  18  --   ALT 16*  --  9*  --   ALKPHOS 137*  --  131*  --   BILITOT 0.4  --  0.3  --   < > = values in this interval not displayed. ------------------------------------------------------------------------------------------------------------------  Cardiac Enzymes No results for input(s): TROPONINI in the last 168 hours. ------------------------------------------------------------------------------------------------------------------  RADIOLOGY:  Ct Head Wo Contrast  Result Date: 02/12/2016 CLINICAL DATA:  Headache, right arm pain. EXAM: CT HEAD WITHOUT CONTRAST TECHNIQUE: Contiguous axial images were obtained from the base of the skull through the vertex without intravenous contrast. COMPARISON:  MRI  of October 19, 2015. FINDINGS: Brain: Large area of white matter edema is seen in the left parietal cortex concerning for underlying metastatic disease. Similar abnormality is seen in both frontal lobes, as well as left cerebellar hemisphere. No hemorrhage is noted. Ventricular size is within normal limits. No significant midline shift is noted at this time. Vascular: Atherosclerosis of  internal carotid arteries is noted. Skull: Bony calvarium appears intact. Sinuses/Orbits: Visualized paranasal sinuses appear normal. Other: None. IMPRESSION: Multiple areas of white matter edema are noted, including the left parietal cortex, both frontal lobes and left cerebellar hemisphere. This is concerning for underlying metastatic disease, and further evaluation with MRI with and without gadolinium is recommended. Electronically Signed   By: Marijo Conception, M.D.   On: 02/12/2016 15:41     ASSESSMENT AND PLAN:   65 year old male with past medical history of lung cancer, hypertension, history of previous DVT, COPD, chronic kidney disease, history of CHF, chronic atrial fibrillation who presented to the hospital due to right arm pain and weakness.  1. Right arm pain/weakness-secondary to brain metastases. Patient has CT scan which was suggestive of metastatic brain disease. Patient does have a history of lung cancer. -Continue IV Decadron, supportive care. -Await MRI of the brain, also MRI of the brachial plexus. Await oncology evaluation. - started on Fentanyl patch and cont. PRN Vicodin.   2. History of previous DVT-continue Eliquis  3. History of atrial fibrillation-currently rate controlled. Continue Eliquis.  4. COPD - no acute exacerbation.  - cont. Albuterol Nebs PRN.    All the records are reviewed and case discussed with Care Management/Social Worker. Management plans discussed with the patient, family and they are in agreement.  CODE STATUS: DNR  DVT Prophylaxis: Eliquis  TOTAL TIME TAKING CARE OF THIS PATIENT: 30 minutes.   POSSIBLE D/C IN 1-2 DAYS, DEPENDING ON CLINICAL CONDITION.   Henreitta Leber M.D on 02/13/2016 at 1:15 PM  Between 7am to 6pm - Pager - [phone]  After 6pm go to [email or web address] - Proofreader  [employer] [facility]  Office  [phone]  CC: Primary care physician; Lloyd Huger, MD

## 2016-02-14 LAB — CBC
HEMATOCRIT: 31.3 % — AB (ref 40.0–52.0)
Hemoglobin: 10.8 g/dL — ABNORMAL LOW (ref 13.0–18.0)
MCH: 28.3 pg (ref 26.0–34.0)
MCHC: 34.5 g/dL (ref 32.0–36.0)
MCV: 82.2 fL (ref 80.0–100.0)
PLATELETS: 218 10*3/uL (ref 150–440)
RBC: 3.81 MIL/uL — AB (ref 4.40–5.90)
RDW: 20.5 % — AB (ref 11.5–14.5)
WBC: 33.3 10*3/uL — AB (ref 3.8–10.6)

## 2016-02-14 MED ORDER — ENSURE ENLIVE PO LIQD
237.0000 mL | Freq: Two times a day (BID) | ORAL | Status: DC
  Administered 2016-02-15 (×2): 237 mL via ORAL

## 2016-02-14 NOTE — Progress Notes (Signed)
Initial Nutrition Assessment  DOCUMENTATION CODES:   Not applicable  INTERVENTION:  -Ensure Enlive po BID, each supplement provides 350 kcal and 20 grams of protein  NUTRITION DIAGNOSIS:   Increased nutrient needs related to cancer and cancer related treatments, chronic illness as evidenced by estimated needs.  GOAL:   Patient will meet greater than or equal to 90% of their needs  MONITOR:   PO intake, Supplement acceptance, Labs, Weight trends, I & O's  REASON FOR ASSESSMENT:   Malnutrition Screening Tool    ASSESSMENT:   Sean Hall  is a 65 y.o. male with a known history of Lung cancer, hypertension presents to the emergency room due to worsening weakness of his right upper extremity. Patient had an MRI of his cervical spine yesterday which showed extension of his lung cancer paravertebrally but no impingement of nerves. Today in the emergency room patient had a CT scan of the head which shows new metastases bilaterally with surrounding edema.  Spoke with Sean Hall, family at bedside. He endorses good PO intake during hospital stay, but poor PO at home for approximately 2-3 weeks. During this time, patient did lose ~5#/2.9% an insignificant amount for the time span. However, he exhibits an overall 10# wt gain since 11/2015 which is awesome for any cancer patient! Encouraged him to continue his current home regimen that has lead to this weight gain. PO intake during stay 100% thus far. No complaints/concerns with chewing/swallowing or nausea/vomiting. Nutrition-Focused physical exam completed. Findings are no fat depletion, mild muscle depletion, and no edema.  Labs and medications reviewed.  Diet Order:  Diet regular Room service appropriate? Yes; Fluid consistency: Thin  Skin:  Reviewed, no issues  Last BM:  02/12/2016  Height:   Ht Readings from Last 1 Encounters:  02/12/16 '6\' 3"'$  (1.905 m)    Weight:   Wt Readings from Last 1 Encounters:  02/14/16 165 lb  (74.8 kg)    Ideal Body Weight:  89.09 kg  BMI:  Body mass index is 20.62 kg/m.  Estimated Nutritional Needs:   Kcal:  2200-2450 calories  Protein:  89-112 gm  Fluid:  >/= 2.2L  EDUCATION NEEDS:   Education needs addressed  Sean Hall. Sean Brahm, MS, RD Sean Hall Inpatient Clinical Dietitian Pager [phone]

## 2016-02-14 NOTE — Progress Notes (Signed)
[facility] Hematology/Oncology Progress Note  Date of admission: 02/12/2016  Hospital day:  02/14/2016  Chief Complaint: Sean Hall is a 65 y.o. 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  Subjective: Right hand grip a little stronger.  Denies headache.  Social History: The patient is accompanied by 2 family members (nieces) today.  Allergies:  Allergies  Allergen Reactions  . Aleve [Naproxen Sodium] Hives, Shortness Of Breath and Swelling    Naproxin    Scheduled Medications: . apixaban  5 mg Oral BID  . dexamethasone  4 mg Intravenous Q6H  . docusate sodium  100 mg Oral BID  . fentaNYL  12.5 mcg Transdermal Q72H  . sodium chloride flush  3 mL Intravenous Q12H    Review of Systems: GENERAL:  Fatigue.  No fevers or sweats.  Feels cold since weight loss.  Significant weight loss prior to diagnosis. PERFORMANCE STATUS (ECOG):  2 HEENT:  No visual changes, runny nose, sore throat, mouth sores or tenderness. Lungs: No shortness of breath or cough.  No hemoptysis. Cardiac:  No chest pain, palpitations, orthopnea, or PND. GI:  No nausea, vomiting, diarrhea, constipation, melena or hematochezia. GU:  No urgency, frequency, dysuria, or hematuria. Musculoskeletal:  No back pain.  No joint pain.  No muscle tenderness. Extremities:  No pain or swelling. Skin:  No rashes or skin changes. Neuro:  Headache, resolved.  Weakness right arm/hand, improved.  No numbness, balance or coordination issues. Endocrine:  No diabetes, thyroid issues, hot flashes or night sweats. Psych:  No mood changes, depression or anxiety. Pain:  Pain in right arm, improved. Review of systems:  All other systems reviewed and found to be negative.  Physical Exam: Blood pressure (!) 179/101, pulse (!) 118, temperature 97.8  F (36.6 C), temperature source Oral, resp. rate 20, height 6' 3" (1.905 m), weight 165 lb (74.8 kg), SpO2 94 %.  GENERAL:  Tall thin gentleman sitting comfortably on the medical unit in no acute distress. MENTAL STATUS:  Alert and oriented to person, place and time. HEAD:  Graying hair with beard.  Male pattern baldness.  Normocephalic, atraumatic, face symmetric, no Cushingoid features. EYES:  Pupils equal round and reactive to light and accomodation.  No conjunctivitis or scleral icterus. ENT:  Oropharynx clear without lesion.  Tongue normal. Mucous membranes moist.  RESPIRATORY:  Clear to auscultation without rales, wheezes or rhonchi. CARDIOVASCULAR:  Regular rate and rhythm without murmur, rub or gallop. ABDOMEN:  Soft, non-tender, with active bowel sounds, and no hepatosplenomegaly.  No masses. SKIN:  No rashes, ulcers or lesions. EXTREMITIES: No edema, no skin discoloration or tenderness.  No palpable cords. LYMPH NODES: No palpable cervical, supraclavicular, axillary or inguinal adenopathy  NEUROLOGICAL: Alert & oriented, word finding difficulty (persistent), right grip strength improved. PSYCH:  Appropriate.   Results for orders placed or performed during the hospital encounter of 02/12/16 (from the past 48 hour(s))  Urinalysis complete, with microscopic     Status: Abnormal   Collection Time: 02/12/16  5:14 PM  Result Value Ref Range   Color, Urine COLORLESS (A) YELLOW   APPearance CLEAR (A) CLEAR   Glucose, UA NEGATIVE NEGATIVE mg/dL   Bilirubin Urine NEGATIVE NEGATIVE   Ketones, ur NEGATIVE NEGATIVE mg/dL   Specific Gravity, Urine 1.004 (L) 1.005 - 1.030   Hgb urine dipstick 1+ (A) NEGATIVE   pH  7.0 5.0 - 8.0   Protein, ur NEGATIVE NEGATIVE mg/dL   Nitrite NEGATIVE NEGATIVE   Leukocytes, UA NEGATIVE NEGATIVE   RBC / HPF 0-5 0 - 5 RBC/hpf   WBC, UA 0-5 0 - 5 WBC/hpf   Bacteria, UA NONE SEEN NONE SEEN   Squamous Epithelial / LPF NONE SEEN NONE SEEN  Urine culture      Status: None   Collection Time: 02/12/16  5:14 PM  Result Value Ref Range   Specimen Description URINE, RANDOM    Special Requests NONE    Culture NO GROWTH Performed at [facility]     Report Status 02/13/2016 FINAL   Lactic acid, plasma     Status: None   Collection Time: 02/12/16  5:14 PM  Result Value Ref Range   Lactic Acid, Venous 1.7 0.5 - 1.9 mmol/L  Culture, blood (routine x 2)     Status: None (Preliminary result)   Collection Time: 02/12/16  5:15 PM  Result Value Ref Range   Specimen Description BLOOD LEFT ARM    Special Requests BOTTLES DRAWN AEROBIC AND ANAEROBIC  10CC    Culture NO GROWTH 2 DAYS    Report Status PENDING   Culture, blood (routine x 2)     Status: None (Preliminary result)   Collection Time: 02/12/16  5:15 PM  Result Value Ref Range   Specimen Description BLOOD LEFT FOREARM    Special Requests BOTTLES DRAWN AEROBIC AND ANAEROBIC  10CC    Culture NO GROWTH 2 DAYS    Report Status PENDING   Lactic acid, plasma     Status: Abnormal   Collection Time: 02/12/16  8:33 PM  Result Value Ref Range   Lactic Acid, Venous 2.3 (HH) 0.5 - 1.9 mmol/L    Comment: CRITICAL RESULT CALLED TO, READ BACK BY AND VERIFIED WITH CYNTHIA BURGESS AT 2109 02/12/2016 BY TFK.   Basic metabolic panel     Status: Abnormal   Collection Time: 02/13/16  2:25 AM  Result Value Ref Range   Sodium 136 135 - 145 mmol/L   Potassium 4.2 3.5 - 5.1 mmol/L   Chloride 102 101 - 111 mmol/L   CO2 25 22 - 32 mmol/L   Glucose, Bld 168 (H) 65 - 99 mg/dL   BUN 17 6 - 20 mg/dL   Creatinine, Ser 1.04 0.61 - 1.24 mg/dL   Calcium 9.0 8.9 - 10.3 mg/dL   GFR calc non Af Amer >60 >60 mL/min   GFR calc Af Amer >60 >60 mL/min    Comment: (NOTE) The eGFR has been calculated using the CKD EPI equation. This calculation has not been validated in all clinical situations. eGFR's persistently <60 mL/min signify possible Chronic Kidney Disease.    Anion gap 9 5 - 15  CBC     Status: Abnormal    Collection Time: 02/13/16  2:25 AM  Result Value Ref Range   WBC 22.5 (H) 3.8 - 10.6 K/uL   RBC 4.02 (L) 4.40 - 5.90 MIL/uL   Hemoglobin 11.1 (L) 13.0 - 18.0 g/dL   HCT 32.9 (L) 40.0 - 52.0 %   MCV 81.8 80.0 - 100.0 fL   MCH 27.7 26.0 - 34.0 pg   MCHC 33.9 32.0 - 36.0 g/dL   RDW 20.6 (H) 11.5 - 14.5 %   Platelets 216 150 - 440 K/uL  Lactic acid, plasma     Status: Abnormal   Collection Time: 02/13/16  2:25 AM  Result Value Ref Range  Lactic Acid, Venous 2.3 (HH) 0.5 - 1.9 mmol/L    Comment: CRITICAL RESULT CALLED TO, READ BACK BY AND VERIFIED WITH CYNTHIA BURGESS AT 0306 02/13/16.PMH  CBC     Status: Abnormal   Collection Time: 02/14/16  4:55 AM  Result Value Ref Range   WBC 33.3 (H) 3.8 - 10.6 K/uL   RBC 3.81 (L) 4.40 - 5.90 MIL/uL   Hemoglobin 10.8 (L) 13.0 - 18.0 g/dL   HCT 31.3 (L) 40.0 - 52.0 %   MCV 82.2 80.0 - 100.0 fL   MCH 28.3 26.0 - 34.0 pg   MCHC 34.5 32.0 - 36.0 g/dL   RDW 20.5 (H) 11.5 - 14.5 %   Platelets 218 150 - 440 K/uL   Ct Head Wo Contrast  Result Date: 02/12/2016 CLINICAL DATA:  Headache, right arm pain. EXAM: CT HEAD WITHOUT CONTRAST TECHNIQUE: Contiguous axial images were obtained from the base of the skull through the vertex without intravenous contrast. COMPARISON:  MRI of October 19, 2015. FINDINGS: Brain: Large area of white matter edema is seen in the left parietal cortex concerning for underlying metastatic disease. Similar abnormality is seen in both frontal lobes, as well as left cerebellar hemisphere. No hemorrhage is noted. Ventricular size is within normal limits. No significant midline shift is noted at this time. Vascular: Atherosclerosis of internal carotid arteries is noted. Skull: Bony calvarium appears intact. Sinuses/Orbits: Visualized paranasal sinuses appear normal. Other: None. IMPRESSION: Multiple areas of white matter edema are noted, including the left parietal cortex, both frontal lobes and left cerebellar hemisphere. This is concerning  for underlying metastatic disease, and further evaluation with MRI with and without gadolinium is recommended. Electronically Signed   By: Marijo Conception, M.D.   On: 02/12/2016 15:41   Mr Jeri Cos CV Contrast  Result Date: 02/13/2016 CLINICAL DATA:  Headaches. RIGHT arm/ hand weakness and numbness. Lung cancer. EXAM: MRI HEAD WITHOUT AND WITH CONTRAST TECHNIQUE: Multiplanar, multiecho pulse sequences of the brain and surrounding structures were obtained without and with intravenous contrast. CONTRAST:  33m MULTIHANCE GADOBENATE DIMEGLUMINE 529 MG/ML IV SOLN COMPARISON:  CT head 02/12/2016 showing multiple abnormalities. MRI cervical spine and MR brachial plexus also, reported separately. FINDINGS: Brain: No restricted diffusion attributed to infarction. No acute or chronic hemorrhage, hydrocephalus, or extra-axial fluid. Premature for age atrophy. Minor white matter disease. Extensive vasogenic edema associated with multiple intracranial metastases demonstrating postcontrast enhancement, described below: - 3 mm, RIGHT cerebellum, image 14. - 8 mm, LEFT cerebellum, image 16. - 3 mm, LEFT occipital, image 23. Also more laterally, sulcal enhancement, uncertain significance. - 5 mm, LEFT frontal parasagittal, image 26. - 4 mm, LEFT inferior frontal, image 26. - 9 mm, LEFT posterior temporal, image 27. - 8 mm, LEFT frontal, image 34. - 4 mm, RIGHT frontal, image 36. - 9 mm, LEFT anterior frontal, image 38. - 12 mm, index lesion, LEFT posterior frontal, image 42. Marked edema without significant midline shift. - 5 mm, LEFT superior frontal, image 44. - 4 mm, LEFT frontoparietal parasagittal, image 50. Vascular: Flow voids are maintained.  LEFT vertebral dominant. Skull and upper cervical spine: No osseous lesions. Cervical spine reported separately. Sinuses/Orbits: No layering sinus fluid.  No orbital mass. Other: None. IMPRESSION: Greater than 10 intracranial metastatic deposits. The dominant lesion is in the LEFT  posterior frontal lobe, 12 mm diameter. The patient's RIGHT arm symptoms likely derive from this mass and/or surrounding edema. Electronically Signed   By: JStaci RighterM.D.   On:  02/13/2016 14:29   Mr Chest W Wo Contrast  Result Date: 02/13/2016 CLINICAL DATA:  Right arm pain.  History of lung cancer. EXAM: MRI CHEST WITHOUT AND WITH CONTRAST TECHNIQUE: Standard multiplanar MR imaging was performed of the right brachial plexus pre and post administration of gadolinium. CONTRAST:  36m MULTIHANCE GADOBENATE DIMEGLUMINE 529 MG/ML IV SOLN COMPARISON:  PET-CT 10/15/2015 and chest CT 09/22/2015 FINDINGS: Large apical lung mass is again demonstrated. MR findings are highly suspicious for chest wall invasion and rib involvement. I do not however see any definite involvement of the brachial plexus structures which are surrounded by fat and do not demonstrate any mass effect, inflammation or enhancement. Small supraclavicular and right subclavicular lymph nodes are noted. The largest node measures approximately 12 mm and is located adjacent to the trachea (best seen on series 3, image 25). Mild edema like signal abnormality and enhancement in the right chest wall musculature. No significant findings in the cervical spine. IMPRESSION: 1. Large right apical lung mass with findings highly suspicious for direct chest wall invasion and rib involvement. 2. No definite involvement of the brachial plexus structures. Electronically Signed   By: PMarijo SanesM.D.   On: 02/13/2016 13:32    Assessment:  SKeagan Brislinis a 65y.o. 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  He received concurrent weekly carboplatin and Taxol with radiation.  He is currently day 19 s/p cycle #1 full dose carboplatin and Taxol with Neulasta support.  Chest MRI on 02/13/2016  revealed a large apical mass with findings suspicious for direct chest wall invasion and rib invasion.  There was no involvement of the brachial plexus.  Head MRI on 02/13/2016 revealed greater than 10 intracranial metastatic deposits. The dominant lesion is in the left posterior frontal lobe is 12 mm.  Other lesions range from 4-9 mm.  Symptomatically, he is feeling better.  He has improved grip strength is his right hand.  Plan:   1.  Oncology:  Discussed with patient and family multi-focal brain metastasis.  Reviewed patient has stage IV disease.  Treatment is palliative.  Discuss continuation of Decadron with plan for whole brain radiation.  Dr. CBaruch Goutyto see patient tomorrow.   Reviewed plan for no chemotherapy next week (planned for 02/17/2016).  Anticipate repeat PET scan in outpatient depratment to restage systemic disease.  If systemic progression then anticipate targeted therapy based on mutational analysis.  Decadron 4 mg IV q 6 hours.  Plan to switch to oral Decadron tomorrow.  Will discuss with Dr. CBaruch Goutytomorrow.  2.  Hematology:  Patient on Eliquis for history of extensive DVT in left lower extremity in 11/2015.  WBC elevated likely secondary to Neulasta and Decadron.    3.  Infectious disease:  Patient afebrile.  He was discharged on cefuroxime on 02/08/2016 for pneumonia.  Current imaging does not suggest pneumonia.  If diarrhea significant, considering checking C diff.  4.  Pain/Toxicology:  He denies any pain on a Fentanyl patch (12 mcg/hr) and hydrocodone/Tylenol every 4 hrs prn pain.    5.  Disposition:  Anticipate discharge on tomorrow after evaluation by radiation oncology.  Code status is DNR.   MLequita Asal MD  02/14/2016, 3:35 PM

## 2016-02-14 NOTE — Progress Notes (Addendum)
[name] [place] NAME: Sean Hall    MR#:  [id number]  DATE OF BIRTH:  Jan 09, 1951  SUBJECTIVE:   Pt. Here due to right arm/shoulder pain and weakness in the right arm. MRI Brain yesterday showing multiple intracranial mets.  Pain in shoulder has improved.  No headache and no other complaints.    REVIEW OF SYSTEMS:    Review of Systems  Constitutional: Negative for chills and fever.  HENT: Negative for congestion and tinnitus.   Eyes: Negative for blurred vision and double vision.  Respiratory: Negative for cough, shortness of breath and wheezing.   Cardiovascular: Negative for chest pain, orthopnea and PND.  Gastrointestinal: Negative for abdominal pain, diarrhea, nausea and vomiting.  Genitourinary: Negative for dysuria and hematuria.  Neurological: Positive for focal weakness (right arm). Negative for dizziness and sensory change.  All other systems reviewed and are negative.   Nutrition: Regular Tolerating Diet: Yes Tolerating [id number]   DRUG ALLERGIES:   Allergies  Allergen Reactions  . Aleve [Naproxen Sodium] Hives, Shortness Of Breath and Swelling    Naproxin    VITALS:  Blood pressure 130/76, pulse (!) 101, temperature 97.6 F (36.4 C), temperature source Oral, resp. rate 20, height '6\' 3"'$  (1.905 m), weight 74.8 kg (165 lb), SpO2 93 %.  PHYSICAL EXAMINATION:   Physical Exam  GENERAL:  65 y.o.-year-old patient lying in the bed with in no acute distress.  EYES: Pupils equal, round, reactive to light and accommodation. No scleral icterus. Extraocular muscles intact.  HEENT: Head atraumatic, normocephalic. Oropharynx and nasopharynx clear.  NECK:  Supple, no jugular venous distention. No thyroid enlargement, no tenderness.  LUNGS: Normal breath sounds bilaterally, no wheezing, rales, rhonchi. No use of accessory muscles of respiration.  CARDIOVASCULAR: S1, S2 normal. No murmurs, rubs, or gallops.  ABDOMEN:  Soft, nontender, nondistended. Bowel sounds present. No organomegaly or mass.  EXTREMITIES: No cyanosis, clubbing or edema b/l.    NEUROLOGIC: Cranial nerves II through XII are intact.  Right upper Ext. Weakness.    PSYCHIATRIC: The patient is alert and oriented x 3.  SKIN: No obvious rash, lesion, or ulcer.    LABORATORY PANEL:   CBC  Recent Labs Lab 02/14/16 0455  WBC 33.3*  HGB 10.8*  HCT 31.3*  PLT 218   ------------------------------------------------------------------------------------------------------------------  Chemistries   Recent Labs Lab 02/12/16 1304 02/13/16 0225  NA 133* 136  K 3.7 4.2  CL 97* 102  CO2 26 25  GLUCOSE 146* 168*  BUN 15 17  CREATININE 1.01 1.04  CALCIUM 9.3 9.0  AST 18  --   ALT 9*  --   ALKPHOS 131*  --   BILITOT 0.3  --    ------------------------------------------------------------------------------------------------------------------  Cardiac Enzymes No results for input(s): TROPONINI in the last 168 hours. ------------------------------------------------------------------------------------------------------------------  RADIOLOGY:  Ct Head Wo Contrast  Result Date: 02/12/2016 CLINICAL DATA:  Headache, right arm pain. EXAM: CT HEAD WITHOUT CONTRAST TECHNIQUE: Contiguous axial images were obtained from the base of the skull through the vertex without intravenous contrast. COMPARISON:  MRI of October 19, 2015. FINDINGS: Brain: Large area of white matter edema is seen in the left parietal cortex concerning for underlying metastatic disease. Similar abnormality is seen in both frontal lobes, as well as left cerebellar hemisphere. No hemorrhage is noted. Ventricular size is within normal limits. No significant midline shift is noted at this time. Vascular: Atherosclerosis of internal carotid arteries is noted. Skull: Bony calvarium appears intact. Sinuses/Orbits: Visualized  paranasal sinuses appear normal. Other: None. IMPRESSION: Multiple  areas of white matter edema are noted, including the left parietal cortex, both frontal lobes and left cerebellar hemisphere. This is concerning for underlying metastatic disease, and further evaluation with MRI with and without gadolinium is recommended. Electronically Signed   By: Marijo Conception, M.D.   On: 02/12/2016 15:41   Mr Jeri Cos ZO Contrast  Result Date: 02/13/2016 CLINICAL DATA:  Headaches. RIGHT arm/ hand weakness and numbness. Lung cancer. EXAM: MRI HEAD WITHOUT AND WITH CONTRAST TECHNIQUE: Multiplanar, multiecho pulse sequences of the brain and surrounding structures were obtained without and with intravenous contrast. CONTRAST:  56m MULTIHANCE GADOBENATE DIMEGLUMINE 529 MG/ML IV SOLN COMPARISON:  CT head 02/12/2016 showing multiple abnormalities. MRI cervical spine and MR brachial plexus also, reported separately. FINDINGS: Brain: No restricted diffusion attributed to infarction. No acute or chronic hemorrhage, hydrocephalus, or extra-axial fluid. Premature for age atrophy. Minor white matter disease. Extensive vasogenic edema associated with multiple intracranial metastases demonstrating postcontrast enhancement, described below: - 3 mm, RIGHT cerebellum, image 14. - 8 mm, LEFT cerebellum, image 16. - 3 mm, LEFT occipital, image 23. Also more laterally, sulcal enhancement, uncertain significance. - 5 mm, LEFT frontal parasagittal, image 26. - 4 mm, LEFT inferior frontal, image 26. - 9 mm, LEFT posterior temporal, image 27. - 8 mm, LEFT frontal, image 34. - 4 mm, RIGHT frontal, image 36. - 9 mm, LEFT anterior frontal, image 38. - 12 mm, index lesion, LEFT posterior frontal, image 42. Marked edema without significant midline shift. - 5 mm, LEFT superior frontal, image 44. - 4 mm, LEFT frontoparietal parasagittal, image 50. Vascular: Flow voids are maintained.  LEFT vertebral dominant. Skull and upper cervical spine: No osseous lesions. Cervical spine reported separately. Sinuses/Orbits: No  layering sinus fluid.  No orbital mass. Other: None. IMPRESSION: Greater than 10 intracranial metastatic deposits. The dominant lesion is in the LEFT posterior frontal lobe, 12 mm diameter. The patient's RIGHT arm symptoms likely derive from this mass and/or surrounding edema. Electronically Signed   By: JStaci RighterM.D.   On: 02/13/2016 14:29   Mr Chest W Wo Contrast  Result Date: 02/13/2016 CLINICAL DATA:  Right arm pain.  History of lung cancer. EXAM: MRI CHEST WITHOUT AND WITH CONTRAST TECHNIQUE: Standard multiplanar MR imaging was performed of the right brachial plexus pre and post administration of gadolinium. CONTRAST:  165mMULTIHANCE GADOBENATE DIMEGLUMINE 529 MG/ML IV SOLN COMPARISON:  PET-CT 10/15/2015 and chest CT 09/22/2015 FINDINGS: Large apical lung mass is again demonstrated. MR findings are highly suspicious for chest wall invasion and rib involvement. I do not however see any definite involvement of the brachial plexus structures which are surrounded by fat and do not demonstrate any mass effect, inflammation or enhancement. Small supraclavicular and right subclavicular lymph nodes are noted. The largest node measures approximately 12 mm and is located adjacent to the trachea (best seen on series 3, image 25). Mild edema like signal abnormality and enhancement in the right chest wall musculature. No significant findings in the cervical spine. IMPRESSION: 1. Large right apical lung mass with findings highly suspicious for direct chest wall invasion and rib involvement. 2. No definite involvement of the brachial plexus structures. Electronically Signed   By: P.Marijo Sanes.D.   On: 02/13/2016 13:32     ASSESSMENT AND PLAN:   [age]ear old male with past medical history of lung cancer, hypertension, history of previous DVT, COPD, chronic kidney disease, history of CHF, chronic atrial fibrillation who  presented to the hospital due to right arm pain and weakness.  1. Right arm  pain/weakness-secondary to brain metastases. Patient has CT scan which was suggestive of metastatic brain disease. Patient does have a history of lung cancer. -Continue IV Decadron but dose tapered by Oncology.  - MRI brain confirming multiple brain metastasis.  Await Radiation Oncology eval tomorrow for need for palliative whole brain radiation.  - weakness in right arm has improved.   - MRI of Chest showing right apical lung mass w/ invasion into ribs.  No involvement of brachial plexus noted.  - pain improved w/ Fentanyl patch and vicodin and will cont.   2. History of previous DVT-continue Eliquis  3. History of atrial fibrillation-currently rate controlled. Continue Eliquis.  4. COPD - no acute exacerbation.  - cont. Albuterol Nebs PRN.   5. Leukocytosis - due to Neulasta.  No acute infectious source presently and will monitor.    All the records are reviewed and case discussed with Care Management/Social Worker. Management plans discussed with the patient, family and they are in agreement.  CODE STATUS: DNR  DVT Prophylaxis: Eliquis  TOTAL TIME TAKING CARE OF THIS PATIENT: 30 minutes.   POSSIBLE D/C IN 1-2 DAYS, DEPENDING ON CLINICAL CONDITION.   Henreitta Leber M.D on 02/14/2016 at 11:51 AM  Between 7am to 6pm - Pager - [phone]  After 6pm go to [email or web address] - Proofreader  [facility]  Office  [phone]  CC: Primary care physician; Lloyd Huger, MD

## 2016-02-15 ENCOUNTER — Ambulatory Visit: Payer: Medicaid Other | Attending: Radiation Oncology | Admitting: Radiation Oncology

## 2016-02-15 ENCOUNTER — Ambulatory Visit
Admission: RE | Admit: 2016-02-15 | Discharge: 2016-02-15 | Disposition: A | Discharge status: Home / Self Care | Payer: Medicaid Other | Source: Ambulatory Visit | Attending: Radiation Oncology | Admitting: Radiation Oncology

## 2016-02-15 DIAGNOSIS — R531 Weakness: Secondary | ICD-10-CM

## 2016-02-15 DIAGNOSIS — I509 Heart failure, unspecified: Secondary | ICD-10-CM

## 2016-02-15 DIAGNOSIS — R05 Cough: Secondary | ICD-10-CM

## 2016-02-15 DIAGNOSIS — C3490 Malignant neoplasm of unspecified part of unspecified bronchus or lung: Secondary | ICD-10-CM

## 2016-02-15 DIAGNOSIS — R5383 Other fatigue: Secondary | ICD-10-CM

## 2016-02-15 DIAGNOSIS — Z7901 Long term (current) use of anticoagulants: Secondary | ICD-10-CM

## 2016-02-15 DIAGNOSIS — Z5111 Encounter for antineoplastic chemotherapy: Secondary | ICD-10-CM | POA: Diagnosis not present

## 2016-02-15 DIAGNOSIS — D72829 Elevated white blood cell count, unspecified: Secondary | ICD-10-CM

## 2016-02-15 DIAGNOSIS — G893 Neoplasm related pain (acute) (chronic): Secondary | ICD-10-CM

## 2016-02-15 MED ORDER — DEXAMETHASONE 4 MG PO TABS: 4.0000 mg | ORAL_TABLET | Freq: Three times a day (TID) | ORAL | 1 refills | Status: AC

## 2016-02-15 MED ORDER — FENTANYL 12 MCG/HR TD PT72: 12.5000 ug | MEDICATED_PATCH | TRANSDERMAL | 0 refills | Status: AC

## 2016-02-15 NOTE — Progress Notes (Signed)
Discharge instructions given and went over with patient, wife, and daughter-in-law. Prescriptions given. Follow-up appointments reviewed; patient to clarify with Dr. Grayland Ormond regarding rescheduling appointment according to radiation. Patient discharged.   Patient transported to [facility] for radiation consultation. Patient to discharge with wife from there. Madlyn Frankel, RN 02/15/16 2:45 PM

## 2016-02-15 NOTE — Progress Notes (Signed)
While rounding, [place] made initial visit with Pt who was in preparation for discharge. Pt was in good spirits and family was bedside. Son is a Occupational psychologist of visit. Emery provided a prayer of celebration for discharge and for strength as he faced new treatments in the [place].    02/15/16 1800  Clinical Encounter Type  Visited With Patient;Patient and family together  Visit Type Initial;Spiritual support  Referral From Nurse  Spiritual Encounters  Spiritual Needs Prayer

## 2016-02-15 NOTE — Progress Notes (Signed)
Radiation Oncology Follow up Note  Name: Sean Hall   Date:   02/12/2016 MRN:  [id number] DOB: [birth year]    This 65 y.o. 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  REFERRING PROVIDER: No ref. provider found  HPI: Patient is a 65 year old male well known to our department having recently completed chemoradiation to his right chest for poorly differentiated carcinoma the right upper lobe stage IIIB. He had been receiving consolidative chemotherapy with carboplatinum and Taxol was admitted over the weekend with decreased strength in his right upper extremity slurred speech. He was found on MRI scan to have significant greater than 10 lesion in his brain compatible with metastatic disease.Marland Kitchen MRI of his chest also showed large right apical lung mass with findings suspicious for direct chest wall invasion and rib involvement. Patient's been started on steroids. I been asked to evaluate him for palliative radiation therapy to his whole brain.  COMPLICATIONS OF TREATMENT: none  FOLLOW UP COMPLIANCE: keeps appointments   PHYSICAL EXAM:  BP (!) 152/84 (BP Location: Right Arm)   Pulse 78   Temp 97.8 F (36.6 C) (Oral)   Resp (!) 22   Ht '6\' 3"'$  (1.905 m)   Wt 162 lb 3.2 oz (73.6 kg)   SpO2 91%   BMI 20.27 kg/m  Patient has subtle weakness in his right upper extremity decreased proprioception is right lower extremity. Well-developed well-nourished patient in NAD. HEENT reveals PERLA, EOMI, discs not visualized.  Oral cavity is clear. No oral mucosal lesions are identified. Neck is clear without evidence of cervical or supraclavicular adenopathy. Lungs are clear to A&P. Cardiac examination is essentially unremarkable with regular rate and rhythm without murmur rub or thrill. Abdomen is benign with no organomegaly or masses noted. Motor sensory and DTR levels are equal and symmetric in  the upper and lower extremities. Cranial nerves II through XII are grossly intact. Proprioception is intact. No peripheral adenopathy or edema is identified. No motor or sensory levels are noted. Crude visual fields are within normal range.  RADIOLOGY RESULTS: MRI of the brain and chest are both reviewed compatible with the above-stated findings  PLAN: At this time I like to start whole brain radiation therapy. Would plan on delivering 3000 cGy in 10 fractions. Risks and benefits of treatment including possible cognitive decline skin reaction fatigue loss of hair or were discussed in detail with the patient. I have set him up for CT simulation today. Will start his treatments later this week. He will remain on steroid therapy at this time.  I would like to take this opportunity to thank you for allowing me to participate in the care of your patient.Armstead Peaks., MD

## 2016-02-15 NOTE — Progress Notes (Signed)
[facility]  [phone] [phone]) [phone]  ID: Sean Hall OB: 1951-03-02  MR#: [id number]  JSE#:[id number]  Patient Care Team: Lloyd Huger, MD as PCP - General (Oncology) Nestor Lewandowsky, MD as Consulting Physician (Cardiothoracic Surgery)  CHIEF COMPLAINT: Stage IIIb poorly differentiated lung cancer, now with brain metastasis.  INTERVAL HISTORY: Patient's mental status and right upper extremity weakness have improved. He is mildly symptomatic, but is nearly back to his baseline. He is initiating XRT to his brain later today and is being discharged after treatment.  REVIEW OF SYSTEMS:   Review of Systems  Constitutional: Positive for malaise/fatigue. Negative for fever and weight loss.  Respiratory: Positive for cough. Negative for shortness of breath.   Cardiovascular: Negative.  Negative for leg swelling.  Gastrointestinal: Negative.  Negative for abdominal pain.  Genitourinary: Negative.   Musculoskeletal: Negative.   Neurological: Positive for focal weakness and weakness.  Psychiatric/Behavioral: Negative.     As per HPI. Otherwise, a complete review of systems is negative.  PAST MEDICAL HISTORY: Past Medical History:  Diagnosis Date  . Arthritis   . Atrial fibrillation ([place])   . Cancer ([place]) 2017   Lung  . CHF (congestive heart failure) ([place])   . Chronic kidney disease   . COPD (chronic obstructive pulmonary disease) ([place])   . Disease of lung   . DVT (deep venous thrombosis) ([facility])    left leg  . Dysrhythmia   . Headache   . History of chemotherapy   . History of radiation therapy   . Hypertension   . Pneumonia     PAST SURGICAL HISTORY: Past Surgical History:  Procedure Laterality Date  . KNEE SURGERY Bilateral   . PERIPHERAL VASCULAR CATHETERIZATION Left 11/11/2015   Procedure: Thrombectomy;  Surgeon: Katha Cabal, MD;  Location: [facility];  Service: Cardiovascular;  Laterality: Left;  . PORTACATH  PLACEMENT N/A 10/29/2015   Procedure: INSERTION PORT-A-CATH;  Surgeon: Nestor Lewandowsky, MD;  Location: [facility];  Service: General;  Laterality: N/A;  . TONSILLECTOMY      FAMILY HISTORY: Family History  Problem Relation Age of Onset  . Diabetes Father   . Hypertension Father     ADVANCED DIRECTIVES (Y/N):  '@ADVDIR'$ @  HEALTH MAINTENANCE: Social History  Substance Use Topics  . Smoking status: Former Smoker    Packs/day: 2.00    Years: 50.00    Types: Cigarettes    Quit date: 08/07/2013  . Smokeless tobacco: Never Used  . Alcohol use No     Comment: rarely     Colonoscopy:  PAP:  Bone density:  Lipid panel:  Allergies  Allergen Reactions  . Aleve [Naproxen Sodium] Hives, Shortness Of Breath and Swelling    Naproxin    No current facility-administered medications for this encounter.    Current Outpatient Prescriptions  Medication Sig Dispense Refill  . albuterol (PROAIR HFA) 108 (90 Base) MCG/ACT inhaler Inhale 1-2 puffs into the lungs every 4 (four) hours as needed. 1 Inhaler 3  . apixaban (ELIQUIS) 5 MG TABS tablet Take 1 tablet (5 mg total) by mouth 2 (two) times daily. 180 tablet 3  . lidocaine-prilocaine (EMLA) cream Apply to affected area once 30 g 3  . Multiple Vitamin (MULTI-VITAMINS) TABS Take 1 tablet by mouth daily as needed.     . ondansetron (ZOFRAN) 8 MG tablet Take 1 tablet (8 mg total) by mouth 2 (two) times daily as needed for refractory nausea / vomiting. Start on day 3 after chemo.  30 tablet 1  . prochlorperazine (COMPAZINE) 10 MG tablet Take 1 tablet (10 mg total) by mouth every 6 (six) hours as needed (Nausea or vomiting). 30 tablet 1  . dexamethasone (DECADRON) 4 MG tablet Take 1 tablet (4 mg total) by mouth 3 (three) times daily. 90 tablet 1  . [START ON 02/16/2016] fentaNYL (DURAGESIC - DOSED MCG/HR) 12 MCG/HR Place 1 patch (12.5 mcg total) onto the skin every 3 (three) days. 5 patch 0   Facility-Administered Medications Ordered in Other Encounters    Medication Dose Route Frequency Provider Last Rate Last Dose  . heparin lock flush 100 unit/mL  500 Units Intravenous Once Lloyd Huger, MD      . sodium chloride flush (NS) 0.9 % injection 10 mL  10 mL Intravenous PRN Lloyd Huger, MD        OBJECTIVE: Vitals:   02/15/16 0536 02/15/16 1353  BP: (!) 152/84 (!) 175/89  Pulse: 78 (!) 103  Resp: (!) 22 18  Temp: 97.8 F (36.6 C) 97.5 F (36.4 C)     Body mass index is 20.27 kg/m.    ECOG FS:1 - Symptomatic but completely ambulatory  General: Well-developed, well-nourished, no acute distress. Eyes: Pink conjunctiva, anicteric sclera. HEENT: Normocephalic, moist mucous membranes, clear oropharnyx. Lungs: Clear to auscultation bilaterally. Heart: Regular rate and rhythm. No rubs, murmurs, or gallops. Abdomen: Soft, nontender, nondistended. No organomegaly noted, normoactive bowel sounds. Musculoskeletal: No edema, cyanosis, or clubbing. Neuro: Alert, answering all questions appropriately. Cranial nerves grossly intact. Skin: No rashes or petechiae noted. Psych: Normal affect. Lymphatics: No cervical, calvicular, axillary or inguinal LAD.   LAB RESULTS:  Lab Results  Component Value Date   NA 136 02/13/2016   K 4.2 02/13/2016   CL 102 02/13/2016   CO2 25 02/13/2016   GLUCOSE 168 (H) 02/13/2016   BUN 17 02/13/2016   CREATININE 1.04 02/13/2016   CALCIUM 9.0 02/13/2016   PROT 7.9 02/12/2016   ALBUMIN 3.3 (L) 02/12/2016   AST 18 02/12/2016   ALT 9 (L) 02/12/2016   ALKPHOS 131 (H) 02/12/2016   BILITOT 0.3 02/12/2016   GFRNONAA >60 02/13/2016   GFRAA >60 02/13/2016    Lab Results  Component Value Date   WBC 33.3 (H) 02/14/2016   NEUTROABS 22.6 (H) 02/06/2016   HGB 10.8 (L) 02/14/2016   HCT 31.3 (L) 02/14/2016   MCV 82.2 02/14/2016   PLT 218 02/14/2016     STUDIES: Dg Chest 2 View  Result Date: 02/06/2016 CLINICAL DATA:  Redness and swelling in the right lower extremity. Known lung cancer in the right  upper lobe. EXAM: CHEST  2 VIEW COMPARISON:  November 04, 2015 FINDINGS: There was dense consolidation in the right upper lobe on the previous study which has improved. There is now opacity throughout the right central lung, not seen previously. Scarring in the left apex. The left Port-A-Cath is stable. No pneumothorax. No change in the cardiomediastinal silhouette. No other interval changes. IMPRESSION: The dense consolidation in the right upper lobe is improved consistent with interval treatment. There is patchy opacity throughout the right central lung. Whether this is pneumonia or post treatment change is unclear on this single study. Recommend clinical correlation and follow-up to resolution. Electronically Signed   By: Dorise Bullion III M.D   On: 02/06/2016 15:48   Ct Head Wo Contrast  Result Date: 02/12/2016 CLINICAL DATA:  Headache, right arm pain. EXAM: CT HEAD WITHOUT CONTRAST TECHNIQUE: Contiguous axial images were obtained from the  base of the skull through the vertex without intravenous contrast. COMPARISON:  MRI of October 19, 2015. FINDINGS: Brain: Large area of white matter edema is seen in the left parietal cortex concerning for underlying metastatic disease. Similar abnormality is seen in both frontal lobes, as well as left cerebellar hemisphere. No hemorrhage is noted. Ventricular size is within normal limits. No significant midline shift is noted at this time. Vascular: Atherosclerosis of internal carotid arteries is noted. Skull: Bony calvarium appears intact. Sinuses/Orbits: Visualized paranasal sinuses appear normal. Other: None. IMPRESSION: Multiple areas of white matter edema are noted, including the left parietal cortex, both frontal lobes and left cerebellar hemisphere. This is concerning for underlying metastatic disease, and further evaluation with MRI with and without gadolinium is recommended. Electronically Signed   By: Marijo Conception, M.D.   On: 02/12/2016 15:41   Mr Jeri Cos YC  Contrast  Result Date: 02/13/2016 CLINICAL DATA:  Headaches. RIGHT arm/ hand weakness and numbness. Lung cancer. EXAM: MRI HEAD WITHOUT AND WITH CONTRAST TECHNIQUE: Multiplanar, multiecho pulse sequences of the brain and surrounding structures were obtained without and with intravenous contrast. CONTRAST:  53m MULTIHANCE GADOBENATE DIMEGLUMINE 529 MG/ML IV SOLN COMPARISON:  CT head 02/12/2016 showing multiple abnormalities. MRI cervical spine and MR brachial plexus also, reported separately. FINDINGS: Brain: No restricted diffusion attributed to infarction. No acute or chronic hemorrhage, hydrocephalus, or extra-axial fluid. Premature for age atrophy. Minor white matter disease. Extensive vasogenic edema associated with multiple intracranial metastases demonstrating postcontrast enhancement, described below: - 3 mm, RIGHT cerebellum, image 14. - 8 mm, LEFT cerebellum, image 16. - 3 mm, LEFT occipital, image 23. Also more laterally, sulcal enhancement, uncertain significance. - 5 mm, LEFT frontal parasagittal, image 26. - 4 mm, LEFT inferior frontal, image 26. - 9 mm, LEFT posterior temporal, image 27. - 8 mm, LEFT frontal, image 34. - 4 mm, RIGHT frontal, image 36. - 9 mm, LEFT anterior frontal, image 38. - 12 mm, index lesion, LEFT posterior frontal, image 42. Marked edema without significant midline shift. - 5 mm, LEFT superior frontal, image 44. - 4 mm, LEFT frontoparietal parasagittal, image 50. Vascular: Flow voids are maintained.  LEFT vertebral dominant. Skull and upper cervical spine: No osseous lesions. Cervical spine reported separately. Sinuses/Orbits: No layering sinus fluid.  No orbital mass. Other: None. IMPRESSION: Greater than 10 intracranial metastatic deposits. The dominant lesion is in the LEFT posterior frontal lobe, 12 mm diameter. The patient's RIGHT arm symptoms likely derive from this mass and/or surrounding edema. Electronically Signed   By: JStaci RighterM.D.   On: 02/13/2016 14:29    Mr Chest W Wo Contrast  Result Date: 02/13/2016 CLINICAL DATA:  Right arm pain.  History of lung cancer. EXAM: MRI CHEST WITHOUT AND WITH CONTRAST TECHNIQUE: Standard multiplanar MR imaging was performed of the right brachial plexus pre and post administration of gadolinium. CONTRAST:  150mMULTIHANCE GADOBENATE DIMEGLUMINE 529 MG/ML IV SOLN COMPARISON:  PET-CT 10/15/2015 and chest CT 09/22/2015 FINDINGS: Large apical lung mass is again demonstrated. MR findings are highly suspicious for chest wall invasion and rib involvement. I do not however see any definite involvement of the brachial plexus structures which are surrounded by fat and do not demonstrate any mass effect, inflammation or enhancement. Small supraclavicular and right subclavicular lymph nodes are noted. The largest node measures approximately 12 mm and is located adjacent to the trachea (best seen on series 3, image 25). Mild edema like signal abnormality and enhancement in the right chest  wall musculature. No significant findings in the cervical spine. IMPRESSION: 1. Large right apical lung mass with findings highly suspicious for direct chest wall invasion and rib involvement. 2. No definite involvement of the brachial plexus structures. Electronically Signed   By: Marijo Sanes M.D.   On: 02/13/2016 13:32   Mr Cervical Spine W Wo Contrast  Result Date: 02/11/2016 CLINICAL DATA:  RIGHT hand weakness and numbness. Symptoms for 2 weeks. No injury. Lung cancer. EXAM: MRI CERVICAL SPINE WITHOUT AND WITH CONTRAST TECHNIQUE: Multiplanar and multiecho pulse sequences of the cervical spine, to include the craniocervical junction and cervicothoracic junction, were obtained according to standard protocol without and with intravenous contrast. CONTRAST:  51m MULTIHANCE GADOBENATE DIMEGLUMINE 529 MG/ML IV SOLN COMPARISON:  PET scan 10/15/2015.  Chest radiograph 02/06/2016. FINDINGS: Alignment: Anatomic Vertebrae: Fatty replaced marrow at T1, T2,  related to XRT. No definite tumor involvement. Cord: No cord compression or abnormal cord signal. Posterior Fossa: No visible tonsillar herniation. Vertebral Arteries: Are patent, LEFT dominant. Paraspinal tissues: Incompletely evaluated on this cervical spine MR is a suspected large RIGHT paravertebral mass, which would correspond to regional spread of tumor at the RIGHT lung apex. See sagittal image 1, arrows, multiple series. This area is not covered with the provided axial images. Concern is raised for brachial plexus involvement given the symptoms of RIGHT hand weakness and numbness. Disc levels: C2-3:  Normal. C3-4: Mild facet arthropathy. Slight LEFT-sided uncinate spurring. No C4 foraminal narrowing. C4-5: Shallow central protrusion. Mild facet arthropathy. LEFT-sided uncinate spurring. LEFT C5 foraminal narrowing. C5-6:  Annular bulge.  No impingement. C6-7: Central protrusion extends more to the LEFT than RIGHT. Superimposed uncinate spurring and asymmetric LEFT-sided facet arthropathy. LEFT C7 foraminal narrowing. C7-T1:  Unremarkable. IMPRESSION: Mild cervical spondylosis, without significant RIGHT-sided neural impingement. Suspected RIGHT paravertebral mass, incompletely evaluated, consistent with regional involvement by the patient's known RIGHT lung cancer. Recommend MRI of the brachial plexus without and with contrast for further evaluation, which could be performed anytime after 10 a.m. on 02/13/2016 to allow sufficient gadolinium elimination. Electronically Signed   By: JStaci RighterM.D.   On: 02/11/2016 12:01   UK[place]Venous Img Lower Unilateral Right  Result Date: 02/06/2016 CLINICAL DATA:  Air edema and pain for 1 day in the right lower extremity EXAM: RIGHT LOWER EXTREMITY VENOUS DUPLEX ULTRASOUND TECHNIQUE: Doppler venous assessment of the left lower extremity deep venous system was performed, including characterization of spectral flow, compressibility, and phasicity. COMPARISON:  None.  FINDINGS: There is complete compressibility of the right common femoral, femoral, and popliteal veins. Doppler analysis demonstrates respiratory phasicity and augmentation of flow with calf compression. No obvious calf vein thrombosis. There is occlusive thrombus in the right great saphenous vein beginning in the distal thigh to the distal calf. Incidental imaging of the left common femoral vein demonstrates nonocclusive thrombus within the left common femoral vein. IMPRESSION: There is no evidence of DVT in the right lower extremity There is superficial occlusive thrombus in the right greater saphenous vein as described There is partially occlusive DVT in the left common femoral vein. Compared to the prior study dated 11/07/2015, this has slightly improved. Electronically Signed   By: AMarybelle KillingsM.D.   On: 02/06/2016 14:53    ASSESSMENT: Stage IIIb poorly differentiated lung cancer, now with brain metastasis.  PLAN:    1.  Oncology: Patient with multifocal brain metastasis confirmed by MRI. He initiated palliative XRT today. Continue steroids as prescribed which can be tapered as an outpatient  by radiation oncology. Chemotherapy initially planned for February 17, 2016, will be delayed until patient's finishes his brain radiation. Return to clinic in approximately 2 weeks for further evaluation and additional treatment planning.   2.   DVT: Patient on Eliquis for history of extensive DVT in left lower extremity in 11/2015.  WBC elevated likely secondary to Neulasta.    3.   Leukocytosis: Likely secondary to recent Neulasta injection as well as steroids. No obvious infection noted.   4.  Pain: Continue current narcotic regimen.  5.  Disposition: Discharge home today after XRT.   Lloyd Huger, MD   02/15/2016 5:54 PM

## 2016-02-15 NOTE — Discharge Summary (Signed)
[facility] [place] NAME: Sean Hall    MR#:  [id number]  DATE OF BIRTH:  Sep 01, 1950  DATE OF ADMISSION:  02/12/2016 ADMITTING PHYSICIAN: Hillary Bow, MD  DATE OF DISCHARGE: 02/15/2016  PRIMARY CARE PHYSICIAN: Lloyd Huger, MD    ADMISSION DIAGNOSIS:  Cerebral edema ([place]) [G93.6] Dehydration [E86.0] Weakness of right upper extremity [R29.898] RUE weakness [R29.898]  DISCHARGE DIAGNOSIS:  Active Problems:   Brain metastases ([place])   SECONDARY DIAGNOSIS:   Past Medical History:  Diagnosis Date  . Arthritis   . Atrial fibrillation ([place])   . Cancer ([place]) 2017   Lung  . CHF (congestive heart failure) ([place])   . Chronic kidney disease   . COPD (chronic obstructive pulmonary disease) ([place])   . Disease of lung   . DVT (deep venous thrombosis) ([facility])    left leg  . Dysrhythmia   . Headache   . History of chemotherapy   . History of radiation therapy   . Hypertension   . Pneumonia     HOSPITAL COURSE:   65 year old male with past medical history of lung cancer, hypertension, history of previous DVT, COPD, chronic kidney disease, history of CHF, chronic atrial fibrillation who presented to the hospital due to right arm pain and weakness.  1. Right arm pain/weakness-secondary to brain metastases. Patient has CT scan/MRI Brain which was suggestive of metastatic brain disease. Patient does have a history of lung cancer. -Well in the hospital patient was given IV Decadron, and also an oncology and radiation oncology consult was obtained. -Patient's pain and weakness has improved with steroids. He is being discharged home on oral Decadron and we'll start whole brain palliative radiation for his metastatic disease as an outpatient. - pt's pain improved w/ Fentanyl patch and he is being discharged on that.   2. History of previous DVT-he will continue Eliquis  3. History of atrial fibrillation-he remained rate controlled  while in the hospital. He will Continue Eliquis.  4. COPD - no acute exacerbation while in the hospital  5. Leukocytosis - due to Neulasta.  No acute infectious source presently and can be further followed as outpatient    DISCHARGE CONDITIONS:   Stable  CONSULTS OBTAINED:  Treatment Team:  Lequita Asal, MD  DRUG ALLERGIES:   Allergies  Allergen Reactions  . Aleve [Naproxen Sodium] Hives, Shortness Of Breath and Swelling    Naproxin    DISCHARGE MEDICATIONS:     Medication List    STOP taking these medications   cefUROXime 500 MG tablet Commonly known as:  CEFTIN     TAKE these medications   albuterol 108 (90 Base) MCG/ACT inhaler Commonly known as:  PROAIR HFA Inhale 1-2 puffs into the lungs every 4 (four) hours as needed.   apixaban 5 MG Tabs tablet Commonly known as:  ELIQUIS Take 1 tablet (5 mg total) by mouth 2 (two) times daily.   dexamethasone 4 MG tablet Commonly known as:  DECADRON Take 1 tablet (4 mg total) by mouth 3 (three) times daily.   fentaNYL 12 MCG/HR Commonly known as:  DURAGESIC - dosed mcg/hr Place 1 patch (12.5 mcg total) onto the skin every 3 (three) days. Start taking on:  02/16/2016   lidocaine-prilocaine cream Commonly known as:  EMLA Apply to affected area once   MULTI-VITAMINS Tabs Take 1 tablet by mouth daily as needed.   ondansetron 8 MG tablet Commonly known as:  ZOFRAN Take 1 tablet (8 mg  total) by mouth 2 (two) times daily as needed for refractory nausea / vomiting. Start on day 3 after chemo.   prochlorperazine 10 MG tablet Commonly known as:  COMPAZINE Take 1 tablet (10 mg total) by mouth every 6 (six) hours as needed (Nausea or vomiting).         DISCHARGE INSTRUCTIONS:   DIET:  Regular diet  DISCHARGE CONDITION:  Stable  ACTIVITY:  Activity as tolerated  OXYGEN:  Home Oxygen: No.   Oxygen Delivery: room air  DISCHARGE LOCATION:  home   If you experience worsening of your admission  symptoms, develop shortness of breath, life threatening emergency, suicidal or homicidal thoughts you must seek medical attention immediately by calling 911 or calling your MD immediately  if symptoms less severe.  You Must read complete instructions/literature along with all the possible adverse reactions/side effects for all the Medicines you take and that have been prescribed to you. Take any new Medicines after you have completely understood and accpet all the possible adverse reactions/side effects.   Please note  You were cared for by a hospitalist during your hospital stay. If you have any questions about your discharge medications or the care you received while you were in the hospital after you are discharged, you can call the unit and asked to speak with the hospitalist on call if the hospitalist that took care of you is not available. Once you are discharged, your primary care physician will handle any further medical issues. Please note that NO REFILLS for any discharge medications will be authorized once you are discharged, as it is imperative that you return to your primary care physician (or establish a relationship with a primary care physician if you do not have one) for your aftercare needs so that they can reassess your need for medications and monitor your lab values.     Today   Right arm pain/weakness much improved.  No headache and no other complaints.   VITAL SIGNS:  Blood pressure (!) 175/89, pulse (!) 103, temperature 97.5 F (36.4 C), temperature source Oral, resp. rate 18, height '6\' 3"'$  (1.905 m), weight 73.6 kg (162 lb 3.2 oz), SpO2 94 %.  I/O:   Intake/Output Summary (Last 24 hours) at 02/15/16 1418 Last data filed at 02/15/16 1200  Gross per 24 hour  Intake              720 ml  Output                0 ml  Net              720 ml    PHYSICAL EXAMINATION:  GENERAL:  65 y.o.-year-old patient lying in the bed with no acute distress.  EYES: Pupils equal, round,  reactive to light and accommodation. No scleral icterus. Extraocular muscles intact.  HEENT: Head atraumatic, normocephalic. Oropharynx and nasopharynx clear.  NECK:  Supple, no jugular venous distention. No thyroid enlargement, no tenderness.  LUNGS: Normal breath sounds bilaterally, no wheezing, rales,rhonchi. No use of accessory muscles of respiration.  CARDIOVASCULAR: S1, S2 normal. No murmurs, rubs, or gallops.  ABDOMEN: Soft, non-tender, non-distended. Bowel sounds present. No organomegaly or mass.  EXTREMITIES: No pedal edema, cyanosis, or clubbing.  NEUROLOGIC: Cranial nerves II through XII are intact. No focal motor or sensory defecits b/l. Right upper ext. Weakness.  PSYCHIATRIC: The patient is alert and oriented x 3. Good affect.  SKIN: No obvious rash, lesion, or ulcer.   DATA REVIEW:  CBC  Recent Labs Lab 02/14/16 0455  WBC 33.3*  HGB 10.8*  HCT 31.3*  PLT 218    Chemistries   Recent Labs Lab 02/12/16 1304 02/13/16 0225  NA 133* 136  K 3.7 4.2  CL 97* 102  CO2 26 25  GLUCOSE 146* 168*  BUN 15 17  CREATININE 1.01 1.04  CALCIUM 9.3 9.0  AST 18  --   ALT 9*  --   ALKPHOS 131*  --   BILITOT 0.3  --     Cardiac Enzymes No results for input(s): TROPONINI in the last 168 hours.  Microbiology Results  Results for orders placed or performed during the hospital encounter of 02/12/16  Urine culture     Status: None   Collection Time: 02/12/16  5:14 PM  Result Value Ref Range Status   Specimen Description URINE, RANDOM  Final   Special Requests NONE  Final   Culture NO GROWTH Performed at [facility]   Final   Report Status 02/13/2016 FINAL  Final  Culture, blood (routine x 2)     Status: None (Preliminary result)   Collection Time: 02/12/16  5:15 PM  Result Value Ref Range Status   Specimen Description BLOOD LEFT ARM  Final   Special Requests BOTTLES DRAWN AEROBIC AND ANAEROBIC  10CC  Final   Culture NO GROWTH 3 DAYS  Final   Report Status  PENDING  Incomplete  Culture, blood (routine x 2)     Status: None (Preliminary result)   Collection Time: 02/12/16  5:15 PM  Result Value Ref Range Status   Specimen Description BLOOD LEFT FOREARM  Final   Special Requests BOTTLES DRAWN AEROBIC AND ANAEROBIC  10CC  Final   Culture NO GROWTH 3 DAYS  Final   Report Status PENDING  Incomplete    RADIOLOGY:  No results found.    Management plans discussed with the patient, family and they are in agreement.  CODE STATUS:     Code Status Orders        Start     Ordered   02/12/16 1806  Do not attempt resuscitation (DNR)  Continuous    Question Answer Comment  In the event of cardiac or respiratory ARREST Do not call a "code blue"   In the event of cardiac or respiratory ARREST Do not perform Intubation, CPR, defibrillation or ACLS   In the event of cardiac or respiratory ARREST Use medication by any route, position, wound care, and other measures to relive pain and suffering. May use oxygen, suction and manual treatment of airway obstruction as needed for comfort.      02/12/16 1806    Code Status History    Date Active Date Inactive Code Status Order ID Comments User Context   02/06/2016  6:22 PM 02/08/2016  4:20 PM Full Code [id number]  Bettey Costa, MD Inpatient   11/11/2015  4:29 PM 11/12/2015  5:56 PM Full Code [id number]  Katha Cabal, MD Inpatient   11/07/2015 11:31 PM 11/09/2015  6:55 PM Full Code [id number]  Lance Coon, MD Inpatient      TOTAL TIME TAKING CARE OF THIS PATIENT: 40 minutes.    Henreitta Leber M.D on 02/15/2016 at 2:18 PM  Between 7am to 6pm - Pager - [phone]  After 6pm go to [email or web address] - Proofreader  [employer] [facility]  Office  [phone]  CC: Primary care physician; Lloyd Huger, MD

## 2016-02-16 DIAGNOSIS — C3411 Malignant neoplasm of upper lobe, right bronchus or lung: Secondary | ICD-10-CM | POA: Diagnosis not present

## 2016-02-16 DIAGNOSIS — Z5111 Encounter for antineoplastic chemotherapy: Secondary | ICD-10-CM | POA: Diagnosis not present

## 2016-02-16 DIAGNOSIS — R5383 Other fatigue: Secondary | ICD-10-CM | POA: Diagnosis not present

## 2016-02-16 DIAGNOSIS — Z86718 Personal history of other venous thrombosis and embolism: Secondary | ICD-10-CM | POA: Diagnosis not present

## 2016-02-17 ENCOUNTER — Inpatient Hospital Stay: Payer: Medicaid Other | Admitting: Oncology

## 2016-02-17 ENCOUNTER — Inpatient Hospital Stay: Payer: Medicaid Other

## 2016-02-17 LAB — CULTURE, BLOOD (ROUTINE X 2)
Culture: NO GROWTH
Culture: NO GROWTH

## 2016-02-18 ENCOUNTER — Ambulatory Visit
Admission: RE | Admit: 2016-02-18 | Discharge: 2016-02-18 | Disposition: A | Discharge status: Home / Self Care | Payer: Medicaid Other | Source: Ambulatory Visit | Attending: Radiation Oncology | Admitting: Radiation Oncology

## 2016-02-18 DIAGNOSIS — Z5111 Encounter for antineoplastic chemotherapy: Secondary | ICD-10-CM | POA: Diagnosis not present

## 2016-02-19 ENCOUNTER — Ambulatory Visit
Admission: RE | Admit: 2016-02-19 | Discharge: 2016-02-19 | Disposition: A | Discharge status: Home / Self Care | Payer: Medicaid Other | Source: Ambulatory Visit | Attending: Radiation Oncology | Admitting: Radiation Oncology

## 2016-02-19 DIAGNOSIS — Z5111 Encounter for antineoplastic chemotherapy: Secondary | ICD-10-CM | POA: Diagnosis not present

## 2016-02-22 ENCOUNTER — Ambulatory Visit
Admission: RE | Admit: 2016-02-22 | Discharge: 2016-02-22 | Disposition: A | Discharge status: Home / Self Care | Payer: Medicaid Other | Source: Ambulatory Visit | Attending: Radiation Oncology | Admitting: Radiation Oncology

## 2016-02-22 DIAGNOSIS — Z5111 Encounter for antineoplastic chemotherapy: Secondary | ICD-10-CM | POA: Diagnosis not present

## 2016-02-23 ENCOUNTER — Ambulatory Visit
Admission: RE | Admit: 2016-02-23 | Discharge: 2016-02-23 | Disposition: A | Discharge status: Home / Self Care | Payer: Medicaid Other | Source: Ambulatory Visit | Attending: Radiation Oncology | Admitting: Radiation Oncology

## 2016-02-23 DIAGNOSIS — Z5111 Encounter for antineoplastic chemotherapy: Secondary | ICD-10-CM | POA: Diagnosis not present

## 2016-02-24 ENCOUNTER — Inpatient Hospital Stay
Admission: EM | Admit: 2016-02-24 | Discharge: 2016-03-09 | DRG: 871 | Disposition: E | Payer: Medicaid Other | Attending: Internal Medicine | Admitting: Internal Medicine

## 2016-02-24 ENCOUNTER — Emergency Department: Payer: Medicaid Other

## 2016-02-24 ENCOUNTER — Ambulatory Visit
Admission: RE | Admit: 2016-02-24 | Discharge: 2016-02-24 | Disposition: A | Discharge status: Home / Self Care | Payer: Medicaid Other | Source: Ambulatory Visit | Attending: Radiation Oncology | Admitting: Radiation Oncology

## 2016-02-24 DIAGNOSIS — I429 Cardiomyopathy, unspecified: Secondary | ICD-10-CM | POA: Diagnosis present

## 2016-02-24 DIAGNOSIS — Z515 Encounter for palliative care: Secondary | ICD-10-CM

## 2016-02-24 DIAGNOSIS — J440 Chronic obstructive pulmonary disease with acute lower respiratory infection: Secondary | ICD-10-CM | POA: Diagnosis present

## 2016-02-24 DIAGNOSIS — I214 Non-ST elevation (NSTEMI) myocardial infarction: Secondary | ICD-10-CM | POA: Diagnosis present

## 2016-02-24 DIAGNOSIS — C7931 Secondary malignant neoplasm of brain: Secondary | ICD-10-CM | POA: Diagnosis present

## 2016-02-24 DIAGNOSIS — I219 Acute myocardial infarction, unspecified: Secondary | ICD-10-CM | POA: Diagnosis not present

## 2016-02-24 DIAGNOSIS — Z79899 Other long term (current) drug therapy: Secondary | ICD-10-CM

## 2016-02-24 DIAGNOSIS — Z87891 Personal history of nicotine dependence: Secondary | ICD-10-CM

## 2016-02-24 DIAGNOSIS — J189 Pneumonia, unspecified organism: Secondary | ICD-10-CM | POA: Diagnosis present

## 2016-02-24 DIAGNOSIS — C3411 Malignant neoplasm of upper lobe, right bronchus or lung: Secondary | ICD-10-CM | POA: Diagnosis present

## 2016-02-24 DIAGNOSIS — H5462 Unqualified visual loss, left eye, normal vision right eye: Secondary | ICD-10-CM | POA: Diagnosis present

## 2016-02-24 DIAGNOSIS — R0602 Shortness of breath: Secondary | ICD-10-CM | POA: Diagnosis present

## 2016-02-24 DIAGNOSIS — J9621 Acute and chronic respiratory failure with hypoxia: Secondary | ICD-10-CM | POA: Diagnosis present

## 2016-02-24 DIAGNOSIS — A419 Sepsis, unspecified organism: Secondary | ICD-10-CM | POA: Diagnosis not present

## 2016-02-24 DIAGNOSIS — N179 Acute kidney failure, unspecified: Secondary | ICD-10-CM | POA: Diagnosis present

## 2016-02-24 DIAGNOSIS — D649 Anemia, unspecified: Secondary | ICD-10-CM | POA: Diagnosis present

## 2016-02-24 DIAGNOSIS — I5023 Acute on chronic systolic (congestive) heart failure: Secondary | ICD-10-CM | POA: Diagnosis present

## 2016-02-24 DIAGNOSIS — Z66 Do not resuscitate: Secondary | ICD-10-CM | POA: Diagnosis not present

## 2016-02-24 DIAGNOSIS — R739 Hyperglycemia, unspecified: Secondary | ICD-10-CM | POA: Diagnosis present

## 2016-02-24 DIAGNOSIS — Z923 Personal history of irradiation: Secondary | ICD-10-CM | POA: Diagnosis not present

## 2016-02-24 DIAGNOSIS — Z9221 Personal history of antineoplastic chemotherapy: Secondary | ICD-10-CM | POA: Diagnosis not present

## 2016-02-24 DIAGNOSIS — R079 Chest pain, unspecified: Secondary | ICD-10-CM | POA: Diagnosis not present

## 2016-02-24 DIAGNOSIS — Y95 Nosocomial condition: Secondary | ICD-10-CM | POA: Diagnosis present

## 2016-02-24 DIAGNOSIS — R0603 Acute respiratory distress: Secondary | ICD-10-CM | POA: Diagnosis not present

## 2016-02-24 DIAGNOSIS — I5031 Acute diastolic (congestive) heart failure: Secondary | ICD-10-CM | POA: Diagnosis not present

## 2016-02-24 DIAGNOSIS — R0902 Hypoxemia: Secondary | ICD-10-CM

## 2016-02-24 DIAGNOSIS — Z8249 Family history of ischemic heart disease and other diseases of the circulatory system: Secondary | ICD-10-CM

## 2016-02-24 DIAGNOSIS — J9601 Acute respiratory failure with hypoxia: Secondary | ICD-10-CM | POA: Diagnosis not present

## 2016-02-24 DIAGNOSIS — J810 Acute pulmonary edema: Secondary | ICD-10-CM

## 2016-02-24 DIAGNOSIS — J8410 Pulmonary fibrosis, unspecified: Secondary | ICD-10-CM | POA: Diagnosis present

## 2016-02-24 DIAGNOSIS — R0609 Other forms of dyspnea: Secondary | ICD-10-CM | POA: Diagnosis not present

## 2016-02-24 DIAGNOSIS — I4891 Unspecified atrial fibrillation: Secondary | ICD-10-CM | POA: Diagnosis present

## 2016-02-24 DIAGNOSIS — J441 Chronic obstructive pulmonary disease with (acute) exacerbation: Secondary | ICD-10-CM | POA: Diagnosis present

## 2016-02-24 DIAGNOSIS — I110 Hypertensive heart disease with heart failure: Secondary | ICD-10-CM | POA: Diagnosis present

## 2016-02-24 DIAGNOSIS — I2699 Other pulmonary embolism without acute cor pulmonale: Secondary | ICD-10-CM | POA: Diagnosis present

## 2016-02-24 DIAGNOSIS — J9600 Acute respiratory failure, unspecified whether with hypoxia or hypercapnia: Secondary | ICD-10-CM

## 2016-02-24 DIAGNOSIS — J449 Chronic obstructive pulmonary disease, unspecified: Secondary | ICD-10-CM | POA: Diagnosis present

## 2016-02-24 DIAGNOSIS — I509 Heart failure, unspecified: Secondary | ICD-10-CM

## 2016-02-24 DIAGNOSIS — Z86718 Personal history of other venous thrombosis and embolism: Secondary | ICD-10-CM

## 2016-02-24 DIAGNOSIS — C3490 Malignant neoplasm of unspecified part of unspecified bronchus or lung: Secondary | ICD-10-CM | POA: Diagnosis not present

## 2016-02-24 DIAGNOSIS — I10 Essential (primary) hypertension: Secondary | ICD-10-CM | POA: Diagnosis present

## 2016-02-24 DIAGNOSIS — Z79891 Long term (current) use of opiate analgesic: Secondary | ICD-10-CM | POA: Diagnosis not present

## 2016-02-24 DIAGNOSIS — R000 Tachycardia, unspecified: Secondary | ICD-10-CM | POA: Diagnosis not present

## 2016-02-24 DIAGNOSIS — Z7901 Long term (current) use of anticoagulants: Secondary | ICD-10-CM

## 2016-02-24 DIAGNOSIS — R0600 Dyspnea, unspecified: Secondary | ICD-10-CM

## 2016-02-24 DIAGNOSIS — Z888 Allergy status to other drugs, medicaments and biological substances status: Secondary | ICD-10-CM

## 2016-02-24 LAB — COMPREHENSIVE METABOLIC PANEL
ALK PHOS: 90 U/L (ref 38–126)
ALT: 51 U/L (ref 17–63)
AST: 51 U/L — AB (ref 15–41)
Albumin: 3.3 g/dL — ABNORMAL LOW (ref 3.5–5.0)
Anion gap: 12 (ref 5–15)
BUN: 34 mg/dL — AB (ref 6–20)
CALCIUM: 8.8 mg/dL — AB (ref 8.9–10.3)
CHLORIDE: 96 mmol/L — AB (ref 101–111)
CO2: 24 mmol/L (ref 22–32)
CREATININE: 1.07 mg/dL (ref 0.61–1.24)
GFR calc Af Amer: >60 mL/min (ref >60)
Glucose, Bld: 199 mg/dL — ABNORMAL HIGH (ref 65–99)
Potassium: 4 mmol/L (ref 3.5–5.1)
Sodium: 132 mmol/L — ABNORMAL LOW (ref 135–145)
Total Bilirubin: 0.8 mg/dL (ref 0.3–1.2)
Total Protein: 6.7 g/dL (ref 6.5–8.1)

## 2016-02-24 LAB — CBC
HEMATOCRIT: 39.2 % — AB (ref 40.0–52.0)
HEMOGLOBIN: 12.8 g/dL — AB (ref 13.0–18.0)
MCH: 28.7 pg (ref 26.0–34.0)
MCHC: 32.7 g/dL (ref 32.0–36.0)
MCV: 87.7 fL (ref 80.0–100.0)
PLATELETS: 103 10*3/uL — AB (ref 150–440)
RBC: 4.47 MIL/uL (ref 4.40–5.90)
RDW: 23 % — ABNORMAL HIGH (ref 11.5–14.5)
WBC: 44.3 10*3/uL — AB (ref 3.8–10.6)

## 2016-02-24 LAB — TROPONIN I: TROPONIN I: 0.52 ng/mL — AB (ref <0.03)

## 2016-02-24 MED ORDER — IPRATROPIUM-ALBUTEROL 0.5-2.5 (3) MG/3ML IN SOLN
3.0000 mL | Freq: Once | RESPIRATORY_TRACT | Status: AC
  Administered 2016-02-24: 3 mL via RESPIRATORY_TRACT
  Filled 2016-02-24: qty 3

## 2016-02-24 MED ORDER — SODIUM CHLORIDE 0.9 % IV BOLUS (SEPSIS)
1000.0000 mL | Freq: Once | INTRAVENOUS | Status: AC
  Administered 2016-02-24: 1000 mL via INTRAVENOUS

## 2016-02-24 MED ORDER — LEVOFLOXACIN IN D5W 750 MG/150ML IV SOLN
750.0000 mg | Freq: Once | INTRAVENOUS | Status: AC
  Administered 2016-02-24: 750 mg via INTRAVENOUS
  Filled 2016-02-24: qty 150

## 2016-02-24 MED ORDER — LABETALOL HCL 5 MG/ML IV SOLN
10.0000 mg | Freq: Once | INTRAVENOUS | Status: AC
  Administered 2016-02-25: 10 mg via INTRAVENOUS

## 2016-02-24 MED ORDER — FUROSEMIDE 10 MG/ML IJ SOLN
60.0000 mg | Freq: Once | INTRAMUSCULAR | Status: AC
Start: 2016-02-24 — End: 2016-02-24
  Administered 2016-02-24: 60 mg via INTRAVENOUS
  Filled 2016-02-24: qty 8

## 2016-02-24 MED ORDER — VANCOMYCIN HCL IN DEXTROSE 1-5 GM/200ML-% IV SOLN
1000.0000 mg | Freq: Once | INTRAVENOUS | Status: AC
  Administered 2016-02-24: 1000 mg via INTRAVENOUS
  Filled 2016-02-24: qty 200

## 2016-02-24 MED ORDER — IOPAMIDOL (ISOVUE-370) INJECTION 76%
75.0000 mL | Freq: Once | INTRAVENOUS | Status: AC | PRN
  Administered 2016-02-24: 75 mL via INTRAVENOUS

## 2016-02-24 MED ORDER — LABETALOL HCL 5 MG/ML IV SOLN
INTRAVENOUS | Status: AC
  Administered 2016-02-25: 10 mg via INTRAVENOUS
  Filled 2016-02-24: qty 4

## 2016-02-24 MED ORDER — ONDANSETRON HCL 4 MG/2ML IJ SOLN
4.0000 mg | Freq: Once | INTRAMUSCULAR | Status: AC
  Administered 2016-02-24: 4 mg via INTRAVENOUS
  Filled 2016-02-24: qty 2

## 2016-02-24 MED ORDER — MORPHINE SULFATE (PF) 2 MG/ML IV SOLN
4.0000 mg | Freq: Once | INTRAVENOUS | Status: AC
  Administered 2016-02-24: 4 mg via INTRAVENOUS
  Filled 2016-02-24: qty 2

## 2016-02-24 MED ORDER — METHYLPREDNISOLONE SODIUM SUCC 125 MG IJ SOLR
125.0000 mg | Freq: Once | INTRAMUSCULAR | Status: AC
  Administered 2016-02-24: 125 mg via INTRAVENOUS
  Filled 2016-02-24: qty 2

## 2016-02-24 MED ORDER — CEFEPIME-DEXTROSE 2 GM/50ML IV SOLR
2.0000 g | Freq: Once | INTRAVENOUS | Status: AC
  Administered 2016-02-25: 2 g via INTRAVENOUS

## 2016-02-24 NOTE — H&P (Signed)
[facility] NAME: Sean Hall    MR#:  [id number]  DATE OF BIRTH:  1950-07-07  DATE OF ADMISSION:  02/25/2016  PRIMARY CARE PHYSICIAN: Lloyd Huger, MD   REQUESTING/REFERRING PHYSICIAN: Kerman Passey, MD  CHIEF COMPLAINT:   Chief Complaint  Patient presents with  . Chest Pain  . Shortness of Breath    HISTORY OF PRESENT ILLNESS:  Sean Hall  is a 65 y.o. male who presents with Chest pain and shortness of breath today. This was acute in onset, not associated with activity or any other exacerbating or alleviating factors. This pain was all across his chest but did move down to his left arm as well. Here in the ED he was found to be tachycardic, with leukocytosis, though he is on steroids at home as part of his cancer regimen. He finished chemotherapy for metastatic lung cancer a few weeks ago, and states that he was diagnosed with pneumonia around that time and treated. CT scan here showed PE as well as groundglass opacities consistent with either pneumonia or edema. Given the patient's myriad of symptoms and findings hospitalists were called for admission and further evaluation and treatment.  PAST MEDICAL HISTORY:   Past Medical History:  Diagnosis Date  . Arthritis   . Atrial fibrillation ([place])   . Cancer ([place]) 2017   Lung  . CHF (congestive heart failure) ([place])   . Chronic kidney disease   . COPD (chronic obstructive pulmonary disease) ([place])   . Disease of lung   . DVT (deep venous thrombosis) ([facility])    left leg  . Dysrhythmia   . Headache   . History of chemotherapy   . History of radiation therapy   . Hypertension   . Pneumonia     PAST SURGICAL HISTORY:   Past Surgical History:  Procedure Laterality Date  . KNEE SURGERY Bilateral   . PERIPHERAL VASCULAR CATHETERIZATION Left 11/11/2015   Procedure: Thrombectomy;  Surgeon: Katha Cabal, MD;  Location: [facility];  Service:  Cardiovascular;  Laterality: Left;  . PORTACATH PLACEMENT N/A 10/29/2015   Procedure: INSERTION PORT-A-CATH;  Surgeon: Nestor Lewandowsky, MD;  Location: [facility];  Service: General;  Laterality: N/A;  . TONSILLECTOMY      SOCIAL HISTORY:   Social History  Substance Use Topics  . Smoking status: Former Smoker    Packs/day: 2.00    Years: 50.00    Types: Cigarettes    Quit date: 08/07/2013  . Smokeless tobacco: Never Used  . Alcohol use No     Comment: rarely    FAMILY HISTORY:   Family History  Problem Relation Age of Onset  . Diabetes Father   . Hypertension Father     DRUG ALLERGIES:   Allergies  Allergen Reactions  . Aleve [Naproxen Sodium] Hives, Shortness Of Breath and Swelling    Naproxin    MEDICATIONS AT HOME:   Prior to Admission medications   Medication Sig Start Date End Date Taking? Authorizing Provider  albuterol (PROAIR HFA) 108 (90 Base) MCG/ACT inhaler Inhale 1-2 puffs into the lungs every 4 (four) hours as needed. 11/18/15   Lloyd Huger, MD  apixaban (ELIQUIS) 5 MG TABS tablet Take 1 tablet (5 mg total) by mouth 2 (two) times daily. 11/30/15   Lloyd Huger, MD  dexamethasone (DECADRON) 4 MG tablet Take 1 tablet (4 mg total) by mouth 3 (three) times daily. 02/15/16   Henreitta Leber,  MD  fentaNYL (DURAGESIC - DOSED MCG/HR) 12 MCG/HR Place 1 patch (12.5 mcg total) onto the skin every 3 (three) days. 02/16/16   Henreitta Leber, MD  lidocaine-prilocaine (EMLA) cream Apply to affected area once 10/28/15   Lloyd Huger, MD  Multiple Vitamin (MULTI-VITAMINS) TABS Take 1 tablet by mouth daily as needed.     Historical Provider, MD  ondansetron (ZOFRAN) 8 MG tablet Take 1 tablet (8 mg total) by mouth 2 (two) times daily as needed for refractory nausea / vomiting. Start on day 3 after chemo. 12/09/15   Lloyd Huger, MD  prochlorperazine (COMPAZINE) 10 MG tablet Take 1 tablet (10 mg total) by mouth every 6 (six) hours as needed (Nausea or vomiting).  10/20/15   Lloyd Huger, MD    REVIEW OF SYSTEMS:  Review of Systems  Constitutional: Negative for chills, fever, malaise/fatigue and weight loss.  HENT: Negative for ear pain, hearing loss and tinnitus.   Eyes: Negative for blurred vision, double vision, pain and redness.  Respiratory: Positive for cough and shortness of breath. Negative for hemoptysis.   Cardiovascular: Positive for chest pain. Negative for palpitations, orthopnea and leg swelling.  Gastrointestinal: Negative for abdominal pain, constipation, diarrhea, nausea and vomiting.  Genitourinary: Negative for dysuria, frequency and hematuria.  Musculoskeletal: Negative for back pain, joint pain and neck pain.  Skin:       No acne, rash, or lesions  Neurological: Negative for dizziness, tremors, focal weakness and weakness.  Endo/Heme/Allergies: Negative for polydipsia. Does not bruise/bleed easily.  Psychiatric/Behavioral: Negative for depression. The patient is not nervous/anxious and does not have insomnia.      VITAL SIGNS:   Vitals:   02/09/2016 2213 02/10/2016 2230 02/27/2016 2300 02/14/2016 2330  BP: (!) 190/107 (!) 162/106 (!) 150/99 (!) 158/104  Pulse: (!) 136 (!) 129 (!) 125 (!) 133  Resp: (!) 44 (!) 27 (!) 23 (!) 27  Temp:      TempSrc:      SpO2: 90% (!) 89% 93% 91%  Weight:      Height:       Wt Readings from Last 3 Encounters:  02/16/2016 73.5 kg (162 lb)  02/15/16 73.6 kg (162 lb 3.2 oz)  02/06/16 74.5 kg (164 lb 3.2 oz)    PHYSICAL EXAMINATION:  Physical Exam  Vitals reviewed. Constitutional: He is oriented to person, place, and time. He appears well-developed and well-nourished. No distress.  HENT:  Head: Normocephalic and atraumatic.  Mouth/Throat: Oropharynx is clear and moist.  Eyes: Conjunctivae and EOM are normal. Pupils are equal, round, and reactive to light. No scleral icterus.  Neck: Normal range of motion. Neck supple. No JVD present. No thyromegaly present.  Cardiovascular: Regular  rhythm and intact distal pulses.  Exam reveals no gallop and no friction rub.   No murmur heard. tachycardic  Respiratory: He is in respiratory distress (mild). He has wheezes. He has rales.  GI: Soft. Bowel sounds are normal. He exhibits no distension. There is no tenderness.  Musculoskeletal: Normal range of motion. He exhibits no edema.  No arthritis, no gout  Lymphadenopathy:    He has no cervical adenopathy.  Neurological: He is alert and oriented to person, place, and time. No cranial nerve deficit.  No dysarthria, no aphasia  Skin: Skin is warm and dry. No rash noted. No erythema.  Psychiatric: He has a normal mood and affect. His behavior is normal. Judgment and thought content normal.    LABORATORY PANEL:   CBC  Recent Labs Lab 02/13/2016 2054  WBC 44.3*  HGB 12.8*  HCT 39.2*  PLT 103*   ------------------------------------------------------------------------------------------------------------------  Chemistries   Recent Labs Lab 03/03/2016 2054  NA 132*  K 4.0  CL 96*  CO2 24  GLUCOSE 199*  BUN 34*  CREATININE 1.07  CALCIUM 8.8*  AST 51*  ALT 51  ALKPHOS 90  BILITOT 0.8   ------------------------------------------------------------------------------------------------------------------  Cardiac Enzymes  Recent Labs Lab 02/27/2016 2054  TROPONINI 0.52*   ------------------------------------------------------------------------------------------------------------------  RADIOLOGY:  Ct Angio Chest Pe W And/or Wo Contrast  Result Date: 03/03/2016 CLINICAL DATA:  Initial evaluation for acute shortness of breath. History of lung carcinoma. EXAM: CT ANGIOGRAPHY CHEST WITH CONTRAST TECHNIQUE: Multidetector CT imaging of the chest was performed using the standard protocol during bolus administration of intravenous contrast. Multiplanar CT image reconstructions and MIPs were obtained to evaluate the vascular anatomy. CONTRAST:  75 cc of Isovue 370. COMPARISON:   Prior MRI from 02/13/2016 as well as prior CT 09/22/2015. Comparison also made with prior radiograph from earlier the same day. FINDINGS: Cardiovascular: Intrathoracic aorta of normal caliber without acute abnormality. Scattered atheromatous plaque within the intrathoracic aorta. Visualized great vessels demonstrate no acute abnormality. Mild cardiomegaly with left atrial enlargement. Three vessel coronary artery calcifications. The no pericardial effusion. Pulmonary arteries adequately opacified for evaluation. Main pulmonary artery measures within normal limits for size at 2.8 cm in diameter. Few small linear filling defects are present within the left lower lobe segmental arteries (series 5, image 175, 200, 223), consistent with small acute pulmonary emboli. No other definite focal filling defects are identified elsewhere within the lungs. No findings to suggest right heart strain. RV LV ratio within normal limits. Mediastinum/Nodes: Thyroid within normal limits. Right peritracheal lymph nodes measure up to 14 mm (series 4, images 34). Sub carinal nodal conglomerate measures 2.2 cm. Right hilar soft tissue fullness/nodal mass measures 3.9 x 2.2 cm. No left hilar adenopathy. Shotty sub cm prevascular and left paratracheal nodes noted. Mildly prominent right cardiophrenic nodes measure up to 9 mm (series 4, image 81). Esophagus within normal limits. No axillary adenopathy. Lungs/Pleura: Irregular mass like opacity at the right lung apex again seen, measuring approximately 5.4 x 7.2 x 6.5 cm on today's study. Multiple right upper lobe segmental artery seen coursing through the mass, which are somewhat attenuated and narrowed. Mass approximates the right upper margin of the mediastinum, and may directly invade the mediastinum. Previously noted direct wall invasion better evaluated on recent MRI. Sub cm right supraclavicular lymph node noted. Irregular right pleural effusion is present. Extensive multifocal areas of  ground-glass with interlobular septal thickening present throughout the lungs bilaterally (crazy paving), suspected to reflect pulmonary edema, superimposed on underlying emphysema. More confluent irregular areas of opacity within the right upper and mid lung may reflect atelectasis and/or areas of infection/infiltrate. Superimposed lymphangitic spread of tumor may be present as well. 19 mm pleural-based nodule noted at the mid right lung (series 6, image 64). Upper Abdomen: 11 mm hypodensity within the medial right hepatic lobe noted, stable, indeterminate. Remainder the visualized upper abdomen unremarkable. Musculoskeletal: No acute osseous abnormality. No worrisome lytic or blastic osseous lesions. Review of the MIP images confirms the above findings. IMPRESSION: 1. Few scattered small filling defects with faint left lower lobe segmental and subsegmental pulmonary arteries as above, consistent with acute pulmonary emboli. No evidence for right heart strain. 2. Persistent right apical lung mass, compatible with known primary bronchogenic carcinoma. Lesion is decreased in size from 09/22/2015, measuring  approximately 5.4 x 7.3 x 6.5 cm on today's study. Possible direct mediastinal invasion again noted. Mediastinal and right hilar as above. 3. Increased patchy parenchymal opacity within the right upper and mid lung. While this finding may in part reflect atelectasis, possible postobstructive pneumonia could also be considered. 4. Patchy multifocal ground-glass opacity with interlobular septal thickening throughout the lungs, suspected to reflect pulmonary edema superimposed on underlying emphysema. 5. Left pleural effusion. Critical Value/emergent results were called by telephone at the time of interpretation on 02/19/2016 at 11:05 pm to Dr. Harvest Dark , who verbally acknowledged these results. Electronically Signed   By: Jeannine Boga M.D.   On: 03/02/2016 23:10   Dg Chest Portable 1 View  Result  Date: 03/06/2016 CLINICAL DATA:  Shortness of breath and chest pain.  Lung carcinoma. EXAM: PORTABLE CHEST 1 VIEW COMPARISON:  February 06, 2016 FINDINGS: There is new airspace opacity in the right mid lung, concerning for acute pneumonia. There is widespread fibrosis bilaterally, much more severe on the right than on the left. There is asymmetric right apical pleural thickening,, likely due to radiation therapy change. There may be residual mass in this area. Fullness in the right hilum raises concern for adenopathy. Heart size is normal. Pulmonary vascularity is stable with some distortion due to the extensive fibrosis. Port-A-Cath tip is in the left innominate vein. No pneumothorax. No blastic or lytic bone lesions are evident. IMPRESSION: Suspect pneumonia right mid lung. Extensive fibrosis is noted bilaterally, more on the right than on the left. Suspect radiation therapy change in the right apex. Residual mass in this area may well be present. There is concern for right hilar adenopathy. Stable cardiac silhouette. Port-A-Cath tip in left innominate vein. No pneumothorax. Electronically Signed   By: Lowella Grip III M.D.   On: 02/07/2016 21:54    EKG:   Orders placed or performed during the hospital encounter of 03/04/2016  . ED EKG  . ED EKG  . EKG 12-Lead  . EKG 12-Lead    IMPRESSION AND PLAN:  Principal Problem:   Sepsis ([place]) - Broad spectrum IV antibiotics with double Pseudomonas coverage started in the ED and continued on admission. Cultures ordered. Active Problems:   HCAP (healthcare-associated pneumonia) - IV antibiotics and cultures as above.   Pulmonary embolism ([place]) - patient is already on full anticoagulation, there is no right heart strain on CT scan, continue anticoagulation   Congestive heart failure ([facility]) - 60 mg IV Lasix given in the ED, we'll get an echocardiogram tomorrow morning and trend cardiac enzymes this was first one was elevated   Chronic obstructive  pulmonary disease ([facility]) - duo nebs and IV steroids given as well as antibiotics as above   BP (high blood pressure) - continue home meds as well as when necessary antihypertensive for blood pressure goal less than 160/100   Malignant neoplasm of right upper lobe of lung [facility]) - oncology consult for any further recommendations  All the records are reviewed and case discussed with ED provider. Management plans discussed with the patient and/or family.  DVT PROPHYLAXIS: Systemic anticoagulation  GI PROPHYLAXIS: None  ADMISSION STATUS: Inpatient  CODE STATUS: DNR Code Status History    Date Active Date Inactive Code Status Order ID Comments User Context   02/12/2016  6:07 PM 02/15/2016  5:48 PM DNR [id number]  Hillary Bow, MD ED   02/06/2016  6:22 PM 02/08/2016  4:20 PM Full Code [id number]  Bettey Costa, MD Inpatient   11/11/2015  4:29  PM 11/12/2015  5:56 PM Full Code [id number]  Katha Cabal, MD Inpatient   11/07/2015 11:31 PM 11/09/2015  6:55 PM Full Code [id number]  Lance Coon, MD Inpatient    Questions for Most Recent Historical Code Status (Order [id number])    Question Answer Comment   In the event of cardiac or respiratory ARREST Do not call a "code blue"    In the event of cardiac or respiratory ARREST Do not perform Intubation, CPR, defibrillation or ACLS    In the event of cardiac or respiratory ARREST Use medication by any route, position, wound care, and other measures to relive pain and suffering. May use oxygen, suction and manual treatment of airway obstruction as needed for comfort.       TOTAL TIME TAKING CARE OF THIS PATIENT: 45 minutes.    Tola Meas FIELDING 02/16/2016, 11:38 PM  [facility]  Office  [phone]  CC: Primary care physician; Lloyd Huger, MD

## 2016-02-24 NOTE — ED Provider Notes (Signed)
[facility] Emergency Department Provider Note  Time seen: 9:01 PM  I have reviewed the triage vital signs and the nursing notes.   HISTORY  Chief Complaint Chest Pain and Shortness of Breath    HPI Sean Hall is a 65 y.o. male with a past medical history of a defibrillation, lung cancer, brain cancer, DVT on Eliquis, COPD who presents the emergency department with acute onset of shortness of breath. According to the patient around 6 PM tonight he developed acute onset of difficulty breathing. EMS states initial saturation of 84% on room air. Brought to the emergency department with a nonrebreather mask. Patient states chest pain around 6/7 PM it has completely resolved at this time. Patient has a history of a DVT in the left lower extremity, currently on Eliquis, had a thrombectomy procedure as well as an IVC filter placed. A shouldn't receive chemotherapy, currently on radiation therapy. Describes shortness breath as moderate, currently on 5 L is a cannula satting 90%.  Past Medical History:  Diagnosis Date  . Arthritis   . Atrial fibrillation ([place])   . Cancer ([place]) 2017   Lung  . CHF (congestive heart failure) ([place])   . Chronic kidney disease   . COPD (chronic obstructive pulmonary disease) ([place])   . Disease of lung   . DVT (deep venous thrombosis) ([facility])    left leg  . Dysrhythmia   . Headache   . History of chemotherapy   . History of radiation therapy   . Hypertension   . Pneumonia     Patient Active Problem List   Diagnosis Date Noted  . Brain metastases ([place]) 02/12/2016  . Pneumonia 02/06/2016  . DVT (deep venous thrombosis), left 11/11/2015  . DVT (deep venous thrombosis) ([place]) 11/07/2015  . Malignant neoplasm of right upper lobe of lung ([place]) 10/17/2015  . Disease of lung 11/25/2013  . BP (high blood pressure) 11/25/2013  . Atrial fibrillation ([place]) 11/22/2013  . Congestive heart failure ([place]) 11/22/2013  . Chronic kidney disease  11/22/2013  . Chronic obstructive pulmonary disease ([place]) 11/22/2013  . PNA (pneumonia) 11/22/2013    Past Surgical History:  Procedure Laterality Date  . KNEE SURGERY Bilateral   . PERIPHERAL VASCULAR CATHETERIZATION Left 11/11/2015   Procedure: Thrombectomy;  Surgeon: Katha Cabal, MD;  Location: [facility];  Service: Cardiovascular;  Laterality: Left;  . PORTACATH PLACEMENT N/A 10/29/2015   Procedure: INSERTION PORT-A-CATH;  Surgeon: Nestor Lewandowsky, MD;  Location: [facility];  Service: General;  Laterality: N/A;  . TONSILLECTOMY      Prior to Admission medications   Medication Sig Start Date End Date Taking? Authorizing Provider  albuterol (PROAIR HFA) 108 (90 Base) MCG/ACT inhaler Inhale 1-2 puffs into the lungs every 4 (four) hours as needed. 11/18/15   Lloyd Huger, MD  apixaban (ELIQUIS) 5 MG TABS tablet Take 1 tablet (5 mg total) by mouth 2 (two) times daily. 11/30/15   Lloyd Huger, MD  dexamethasone (DECADRON) 4 MG tablet Take 1 tablet (4 mg total) by mouth 3 (three) times daily. 02/15/16   Henreitta Leber, MD  fentaNYL (DURAGESIC - DOSED MCG/HR) 12 MCG/HR Place 1 patch (12.5 mcg total) onto the skin every 3 (three) days. 02/16/16   Henreitta Leber, MD  lidocaine-prilocaine (EMLA) cream Apply to affected area once 10/28/15   Lloyd Huger, MD  Multiple Vitamin (MULTI-VITAMINS) TABS Take 1 tablet by mouth daily as needed.     Historical Provider, MD  ondansetron (ZOFRAN) 8 MG tablet Take 1 tablet (8 mg total) by mouth 2 (two) times daily as needed for refractory nausea / vomiting. Start on day 3 after chemo. 12/09/15   Lloyd Huger, MD  prochlorperazine (COMPAZINE) 10 MG tablet Take 1 tablet (10 mg total) by mouth every 6 (six) hours as needed (Nausea or vomiting). 10/20/15   Lloyd Huger, MD    Allergies  Allergen Reactions  . Aleve [Naproxen Sodium] Hives, Shortness Of Breath and Swelling    Naproxin    Family History  Problem Relation Age of  Onset  . Diabetes Father   . Hypertension Father     Social History Social History  Substance Use Topics  . Smoking status: Former Smoker    Packs/day: 2.00    Years: 50.00    Types: Cigarettes    Quit date: 08/07/2013  . Smokeless tobacco: Never Used  . Alcohol use No     Comment: rarely    Review of Systems Constitutional: Negative for fever. Cardiovascular: Positive for chest pain now resolved. Respiratory: Positive shortness of breath. Gastrointestinal: Negative for abdominal pain Neurological: Negative for headache 10-point ROS otherwise negative.  ____________________________________________   PHYSICAL EXAM:  VITAL SIGNS: ED Triage Vitals  Enc Vitals Group     BP 02/23/2016 2049 (!) 171/109     Pulse Rate 02/27/2016 2049 (!) 132     Resp 02/14/2016 2049 (!) 29     Temp 03/08/2016 2049 97.5 F (36.4 C)     Temp Source 02/20/2016 2049 Oral     SpO2 02/11/2016 2049 (!) 88 %     Weight 02/09/2016 2050 162 lb (73.5 kg)     Height 03/02/2016 2050 '6\' 3"'$  (1.905 m)     Head Circumference --      Peak Flow --      Pain Score 03/08/2016 2050 1     Pain Loc --      Pain Edu? --      Excl. in [place]? --     Constitutional: Alert and oriented. Mild distress due to difficulty breathing. Eyes: Normal exam ENT   Head: Normocephalic and atraumatic.   Mouth/Throat: Mucous membranes are moist. Cardiovascular: Normal rate, regular rhythm. No murmur Respiratory: Moderate tachypnea. Mild expiratory wheeze bilaterally. Moderate respiratory distress sitting upright in bed. Gastrointestinal: Soft and nontender. No distention. Musculoskeletal: Nontender with normal range of motion in all extremities. No lower external tenderness or edema. Neurologic:  Normal speech and language. No gross focal neurologic deficits Skin:  Skin is warm, dry and intact.  Psychiatric: Mood and affect are normal.   ____________________________________________    EKG  EKG reviewed and interpreted by myself  shows sinus tachycardia 131 bpm, narrow QRS, normal axis, largely normal intervals. Nonspecific ST changes but no ST elevation.  ____________________________________________    RADIOLOGY  Or concerning for right-sided opacity. Radiology called me about the CT scan showing several very small pulmonary emboli, considerable amount of groundglass opacities, infectious versus pulmonary edema.  ____________________________________________   INITIAL IMPRESSION / ASSESSMENT AND PLAN / ED COURSE  Pertinent labs & imaging results that were available during my care of the patient were reviewed by me and considered in my medical decision making (see chart for details).  The patient presents the emergency department with significant trouble breathing, history of lung cancer on radiation, history of COPD. Patient states very sudden onset around 6 PM. He has a history of left lower cavity DVT on Eliquis with an IVC filter  in place. Given the acute onset we'll proceed with a CT angiography of the chest, treat with DuoNeb times, Solu-Medrol, close monitoring the emergency department while awaiting lab and imaging results. CT shows small pulmonary emboli, likely pulmonary edema but cannot rule out pneumonia. Given the patient's acute onset we will cover with Lasix for possible pulmonary edema, we will cover with antibiotics to cover for Hcap. Patient currently on anticoagulation. We will admit to the hospitalist service for further treatment. Patient remains hypoxic, satting in the 90s however on 5 L via nasal cannula.  CRITICAL CARE Performed by: Harvest Dark   Total critical care time: 30 minutes  Critical care time was exclusive of separately billable procedures and treating other patients.  Critical care was necessary to treat or prevent imminent or life-threatening deterioration.  Critical care was time spent personally by me on the following activities: development of treatment plan with  patient and/or surrogate as well as nursing, discussions with consultants, evaluation of patient's response to treatment, examination of patient, obtaining history from patient or surrogate, ordering and performing treatments and interventions, ordering and review of laboratory studies, ordering and review of radiographic studies, pulse oximetry and re-evaluation of patient's condition.  ____________________________________________   FINAL CLINICAL IMPRESSION(S) / ED DIAGNOSES  Pulmonary emboli Health care associated pneumonia Pulmonary edema Dyspnea    Harvest Dark, MD 03/05/2016 2311

## 2016-02-24 NOTE — ED Notes (Signed)
CT informed of 20 gauge IV in R AC.

## 2016-02-24 NOTE — ED Triage Notes (Signed)
Pt presents to ED via [facility] for CP and SOB that began around 7p. Pt has brain CA and lung CA, taking break from chemo but taking radiation and a steroid for brain CA. Pt went to [facility] first and was 84% RA. Placed on non-rebreather and was 95%. EMS gave '10mg'$  albuterol, atrovent and 324 ASA. Pt has fentanyl patch 35mg on L upper back/shoulder. EMS heard wheezing and rhonci all over. Pt also told EMS that pain went into L arm, but is not there anymore. Pain went from 10 to 1 with EMS. NO nitro given. BP with EMS 180/1274mR 130's.

## 2016-02-24 NOTE — ED Notes (Signed)
Pt oxygen not responding using nasal cannula even with increasing liters. Put mask back on pt at 8L.

## 2016-02-25 ENCOUNTER — Ambulatory Visit: Payer: Medicaid Other

## 2016-02-25 ENCOUNTER — Inpatient Hospital Stay: Payer: Medicaid Other

## 2016-02-25 ENCOUNTER — Inpatient Hospital Stay
Admit: 2016-02-25 | Discharge: 2016-02-25 | Disposition: A | Discharge status: Home / Self Care | Payer: Medicaid Other | Attending: Cardiovascular Disease | Admitting: Cardiovascular Disease

## 2016-02-25 DIAGNOSIS — I499 Cardiac arrhythmia, unspecified: Secondary | ICD-10-CM

## 2016-02-25 DIAGNOSIS — Z79899 Other long term (current) drug therapy: Secondary | ICD-10-CM

## 2016-02-25 DIAGNOSIS — R0902 Hypoxemia: Secondary | ICD-10-CM

## 2016-02-25 DIAGNOSIS — J9601 Acute respiratory failure with hypoxia: Secondary | ICD-10-CM

## 2016-02-25 DIAGNOSIS — I4891 Unspecified atrial fibrillation: Secondary | ICD-10-CM

## 2016-02-25 DIAGNOSIS — I509 Heart failure, unspecified: Secondary | ICD-10-CM

## 2016-02-25 DIAGNOSIS — Z515 Encounter for palliative care: Secondary | ICD-10-CM

## 2016-02-25 DIAGNOSIS — Z86718 Personal history of other venous thrombosis and embolism: Secondary | ICD-10-CM

## 2016-02-25 DIAGNOSIS — I5031 Acute diastolic (congestive) heart failure: Secondary | ICD-10-CM

## 2016-02-25 DIAGNOSIS — C3490 Malignant neoplasm of unspecified part of unspecified bronchus or lung: Secondary | ICD-10-CM

## 2016-02-25 DIAGNOSIS — R0609 Other forms of dyspnea: Secondary | ICD-10-CM

## 2016-02-25 DIAGNOSIS — C3411 Malignant neoplasm of upper lobe, right bronchus or lung: Secondary | ICD-10-CM

## 2016-02-25 DIAGNOSIS — Z9221 Personal history of antineoplastic chemotherapy: Secondary | ICD-10-CM

## 2016-02-25 DIAGNOSIS — I129 Hypertensive chronic kidney disease with stage 1 through stage 4 chronic kidney disease, or unspecified chronic kidney disease: Secondary | ICD-10-CM

## 2016-02-25 DIAGNOSIS — R5383 Other fatigue: Secondary | ICD-10-CM

## 2016-02-25 DIAGNOSIS — N189 Chronic kidney disease, unspecified: Secondary | ICD-10-CM

## 2016-02-25 DIAGNOSIS — Z87891 Personal history of nicotine dependence: Secondary | ICD-10-CM

## 2016-02-25 DIAGNOSIS — R0602 Shortness of breath: Secondary | ICD-10-CM

## 2016-02-25 DIAGNOSIS — Z794 Long term (current) use of insulin: Secondary | ICD-10-CM

## 2016-02-25 DIAGNOSIS — R079 Chest pain, unspecified: Secondary | ICD-10-CM

## 2016-02-25 DIAGNOSIS — D72829 Elevated white blood cell count, unspecified: Secondary | ICD-10-CM

## 2016-02-25 DIAGNOSIS — J810 Acute pulmonary edema: Secondary | ICD-10-CM

## 2016-02-25 DIAGNOSIS — I219 Acute myocardial infarction, unspecified: Secondary | ICD-10-CM

## 2016-02-25 DIAGNOSIS — I2699 Other pulmonary embolism without acute cor pulmonale: Secondary | ICD-10-CM

## 2016-02-25 DIAGNOSIS — J9600 Acute respiratory failure, unspecified whether with hypoxia or hypercapnia: Secondary | ICD-10-CM

## 2016-02-25 DIAGNOSIS — Z923 Personal history of irradiation: Secondary | ICD-10-CM

## 2016-02-25 DIAGNOSIS — J189 Pneumonia, unspecified organism: Secondary | ICD-10-CM

## 2016-02-25 DIAGNOSIS — Z66 Do not resuscitate: Secondary | ICD-10-CM

## 2016-02-25 DIAGNOSIS — J449 Chronic obstructive pulmonary disease, unspecified: Secondary | ICD-10-CM

## 2016-02-25 DIAGNOSIS — M129 Arthropathy, unspecified: Secondary | ICD-10-CM

## 2016-02-25 DIAGNOSIS — Z8701 Personal history of pneumonia (recurrent): Secondary | ICD-10-CM

## 2016-02-25 DIAGNOSIS — C7931 Secondary malignant neoplasm of brain: Secondary | ICD-10-CM

## 2016-02-25 LAB — APTT
APTT: 62 s — AB (ref 24–36)
aPTT: 24 seconds (ref 24–36)
aPTT: 66 seconds — ABNORMAL HIGH (ref 24–36)

## 2016-02-25 LAB — BASIC METABOLIC PANEL
ANION GAP: 13 (ref 5–15)
BUN: 40 mg/dL — AB (ref 6–20)
CALCIUM: 8.6 mg/dL — AB (ref 8.9–10.3)
CO2: 24 mmol/L (ref 22–32)
Chloride: 97 mmol/L — ABNORMAL LOW (ref 101–111)
Creatinine, Ser: 1.09 mg/dL (ref 0.61–1.24)
GFR calc Af Amer: >60 mL/min (ref >60)
GFR calc non Af Amer: >60 mL/min (ref >60)
GLUCOSE: 175 mg/dL — AB (ref 65–99)
Potassium: 4.2 mmol/L (ref 3.5–5.1)
Sodium: 134 mmol/L — ABNORMAL LOW (ref 135–145)

## 2016-02-25 LAB — TROPONIN I
TROPONIN I: 4.15 ng/mL — AB (ref <0.03)
TROPONIN I: 12.34 ng/mL — AB (ref <0.03)
Troponin I: 19.65 ng/mL (ref <0.03)

## 2016-02-25 LAB — PROTIME-INR
INR: 1.36
Prothrombin Time: 16.9 seconds — ABNORMAL HIGH (ref 11.4–15.2)

## 2016-02-25 LAB — GLUCOSE, CAPILLARY
GLUCOSE-CAPILLARY: 174 mg/dL — AB (ref 65–99)
Glucose-Capillary: 206 mg/dL — ABNORMAL HIGH (ref 65–99)
Glucose-Capillary: 171 mg/dL — ABNORMAL HIGH (ref 65–99)
Glucose-Capillary: 174 mg/dL — ABNORMAL HIGH (ref 65–99)

## 2016-02-25 LAB — HEPARIN LEVEL (UNFRACTIONATED)
HEPARIN UNFRACTIONATED: 0.82 [IU]/mL — AB (ref 0.30–0.70)
HEPARIN UNFRACTIONATED: 1.02 [IU]/mL — AB (ref 0.30–0.70)

## 2016-02-25 LAB — ECHOCARDIOGRAM COMPLETE
HEIGHTINCHES: 74 in
WEIGHTICAEL: 2659.63 [oz_av]

## 2016-02-25 LAB — INFLUENZA PANEL BY PCR (TYPE A & B)
H1N1 flu by pcr: NOT DETECTED
Influenza A By PCR: NEGATIVE
Influenza B By PCR: NEGATIVE

## 2016-02-25 LAB — MRSA PCR SCREENING: MRSA BY PCR: NEGATIVE

## 2016-02-25 MED ORDER — HEPARIN (PORCINE) IN NACL 100-0.45 UNIT/ML-% IJ SOLN
1400.0000 [IU]/h | INTRAMUSCULAR | Status: DC
  Administered 2016-02-25: 900 [IU]/h via INTRAVENOUS
  Administered 2016-02-25: 1000 [IU]/h via INTRAVENOUS
  Administered 2016-02-27: 1300 [IU]/h via INTRAVENOUS
  Administered 2016-02-28 (×2): 1400 [IU]/h via INTRAVENOUS
  Filled 2016-02-25 (×8): qty 250

## 2016-02-25 MED ORDER — VANCOMYCIN HCL IN DEXTROSE 1-5 GM/200ML-% IV SOLN
1000.0000 mg | Freq: Two times a day (BID) | INTRAVENOUS | Status: DC
  Administered 2016-02-25: 1000 mg via INTRAVENOUS
  Filled 2016-02-25 (×2): qty 200

## 2016-02-25 MED ORDER — ONDANSETRON HCL 4 MG PO TABS: 8.0000 mg | ORAL_TABLET | Freq: Three times a day (TID) | ORAL | Status: DC | PRN

## 2016-02-25 MED ORDER — METHYLPREDNISOLONE SODIUM SUCC 125 MG IJ SOLR
60.0000 mg | Freq: Three times a day (TID) | INTRAMUSCULAR | Status: DC
  Administered 2016-02-25 – 2016-02-26 (×2): 60 mg via INTRAVENOUS
  Filled 2016-02-25 (×2): qty 2

## 2016-02-25 MED ORDER — METHYLPREDNISOLONE SODIUM SUCC 40 MG IJ SOLR: 40.0000 mg | Freq: Two times a day (BID) | INTRAMUSCULAR | Status: DC

## 2016-02-25 MED ORDER — ONDANSETRON HCL 40 MG/20ML IJ SOLN: 8.0000 mg | Freq: Three times a day (TID) | INTRAMUSCULAR | Status: DC | PRN

## 2016-02-25 MED ORDER — HEPARIN BOLUS VIA INFUSION
4000.0000 [IU] | Freq: Once | INTRAVENOUS | Status: AC
  Administered 2016-02-25: 4000 [IU] via INTRAVENOUS
  Filled 2016-02-25: qty 4000

## 2016-02-25 MED ORDER — PROCHLORPERAZINE MALEATE 10 MG PO TABS: 10.0000 mg | ORAL_TABLET | Freq: Four times a day (QID) | ORAL | Status: DC | PRN

## 2016-02-25 MED ORDER — ACETAMINOPHEN 650 MG RE SUPP
650.0000 mg | Freq: Four times a day (QID) | RECTAL | Status: DC | PRN
Start: 2016-02-25 — End: 2016-02-29

## 2016-02-25 MED ORDER — BUDESONIDE 0.5 MG/2ML IN SUSP
0.5000 mg | Freq: Two times a day (BID) | RESPIRATORY_TRACT | Status: DC
  Administered 2016-02-25 – 2016-02-29 (×9): 0.5 mg via RESPIRATORY_TRACT
  Filled 2016-02-25 (×9): qty 2

## 2016-02-25 MED ORDER — SODIUM CHLORIDE 0.9% FLUSH
3.0000 mL | Freq: Two times a day (BID) | INTRAVENOUS | Status: DC
  Administered 2016-02-25 – 2016-02-28 (×9): 3 mL via INTRAVENOUS

## 2016-02-25 MED ORDER — IPRATROPIUM-ALBUTEROL 0.5-2.5 (3) MG/3ML IN SOLN
3.0000 mL | RESPIRATORY_TRACT | Status: DC | PRN
  Filled 2016-02-25: qty 3

## 2016-02-25 MED ORDER — LEVOFLOXACIN IN D5W 750 MG/150ML IV SOLN
750.0000 mg | INTRAVENOUS | Status: DC
  Filled 2016-02-25: qty 150

## 2016-02-25 MED ORDER — FENTANYL 12 MCG/HR TD PT72
12.5000 ug | MEDICATED_PATCH | TRANSDERMAL | Status: DC
  Administered 2016-02-25 – 2016-02-28 (×2): 12.5 ug via TRANSDERMAL
  Filled 2016-02-25 (×2): qty 1

## 2016-02-25 MED ORDER — NITROGLYCERIN 2 % TD OINT
0.5000 [in_us] | TOPICAL_OINTMENT | Freq: Four times a day (QID) | TRANSDERMAL | Status: DC
  Administered 2016-02-25 – 2016-02-29 (×18): 0.5 [in_us] via TOPICAL
  Filled 2016-02-25 (×19): qty 1

## 2016-02-25 MED ORDER — INSULIN ASPART 100 UNIT/ML ~~LOC~~ SOLN
0.0000 [IU] | Freq: Four times a day (QID) | SUBCUTANEOUS | Status: DC
  Administered 2016-02-25: 3 [IU] via SUBCUTANEOUS
  Administered 2016-02-25 – 2016-02-26 (×4): 2 [IU] via SUBCUTANEOUS
  Filled 2016-02-25: qty 2
  Filled 2016-02-25 (×2): qty 3
  Filled 2016-02-25: qty 2

## 2016-02-25 MED ORDER — ACETAMINOPHEN 325 MG PO TABS
650.0000 mg | ORAL_TABLET | Freq: Four times a day (QID) | ORAL | Status: DC | PRN
  Administered 2016-02-25 – 2016-02-28 (×6): 650 mg via ORAL
  Filled 2016-02-25 (×6): qty 2

## 2016-02-25 MED ORDER — CEFEPIME-DEXTROSE 2 GM/50ML IV SOLR
INTRAVENOUS | Status: AC
  Administered 2016-02-25: 2 g via INTRAVENOUS
  Filled 2016-02-25: qty 50

## 2016-02-25 MED ORDER — INSULIN ASPART 100 UNIT/ML ~~LOC~~ SOLN
0.0000 [IU] | Freq: Every day | SUBCUTANEOUS | Status: DC
  Filled 2016-02-25: qty 2

## 2016-02-25 MED ORDER — METHYLPREDNISOLONE SODIUM SUCC 125 MG IJ SOLR
60.0000 mg | Freq: Four times a day (QID) | INTRAMUSCULAR | Status: DC
  Administered 2016-02-25 (×2): 60 mg via INTRAVENOUS
  Filled 2016-02-25 (×2): qty 2

## 2016-02-25 MED ORDER — IPRATROPIUM-ALBUTEROL 0.5-2.5 (3) MG/3ML IN SOLN
3.0000 mL | RESPIRATORY_TRACT | Status: DC
Start: 2016-02-25 — End: 2016-02-29
  Administered 2016-02-25 – 2016-02-29 (×23): 3 mL via RESPIRATORY_TRACT
  Filled 2016-02-25 (×24): qty 3

## 2016-02-25 MED ORDER — APIXABAN 5 MG PO TABS: 5.0000 mg | ORAL_TABLET | Freq: Two times a day (BID) | ORAL | Status: DC

## 2016-02-25 MED ORDER — DEXTROSE 5 % IV SOLN
2.0000 g | Freq: Three times a day (TID) | INTRAVENOUS | Status: DC
  Administered 2016-02-25 – 2016-02-26 (×4): 2 g via INTRAVENOUS
  Filled 2016-02-25 (×6): qty 2

## 2016-02-25 MED ORDER — OXYCODONE HCL 5 MG PO TABS
5.0000 mg | ORAL_TABLET | ORAL | Status: DC | PRN
  Administered 2016-02-25 (×2): 5 mg via ORAL
  Filled 2016-02-25 (×2): qty 1

## 2016-02-25 MED ORDER — METHYLPREDNISOLONE SODIUM SUCC 125 MG IJ SOLR: 60.0000 mg | Freq: Four times a day (QID) | INTRAMUSCULAR | Status: DC

## 2016-02-25 MED ORDER — FUROSEMIDE 10 MG/ML IJ SOLN
40.0000 mg | Freq: Two times a day (BID) | INTRAMUSCULAR | Status: AC
  Administered 2016-02-25 – 2016-02-26 (×4): 40 mg via INTRAVENOUS
  Filled 2016-02-25 (×4): qty 4

## 2016-02-25 NOTE — Consult Note (Signed)
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  INTERVAL HISTORY: Patient is 65 year old male who recently was found to have metastatic lung cancer in his brain and is currently undergoing XRT. He presented to the emergency room with acute onset shortness of breath, chest pain, and left arm numbness. Upon evaluation in the emergency room he was found to have an acute MI as well as possible subsegmental pulmonary embolism. Patient continues on high flow oxygen. He is arousable and offers no other complaints. His chest pain has resolved. His right arm weakness from his brain metastasis has significantly improved. He has no new neurologic complaints. He has a fair appetite, but denies weight loss. He denies any nausea, vomiting, constipation, or diarrhea. He has no urinary complaints. Patient otherwise feels well and offers no further specific complaints.   REVIEW OF SYSTEMS:   Review of Systems  Constitutional: Positive for malaise/fatigue. Negative for fever and weight loss.  Respiratory: Positive for shortness of breath. Negative for cough and hemoptysis.   Cardiovascular: Positive for chest pain. Negative for leg swelling.  Gastrointestinal: Negative.  Negative for abdominal pain.  Genitourinary: Negative.   Musculoskeletal: Negative.   Skin: Negative.   Neurological: Positive for weakness.  Psychiatric/Behavioral: Negative.     As per HPI. Otherwise, a complete review of systems is negative.  PAST MEDICAL HISTORY: Past Medical History:  Diagnosis Date  . Arthritis   . Atrial fibrillation ([place])   . Cancer  ([place]) 2017   Lung  . CHF (congestive heart failure) ([place])   . Chronic kidney disease   . COPD (chronic obstructive pulmonary disease) ([place])   . Disease of lung   . DVT (deep venous thrombosis) ([facility])    left leg  . Dysrhythmia   . Headache   . History of chemotherapy   . History of radiation therapy   . Hypertension   . Pneumonia     PAST SURGICAL HISTORY: Past Surgical History:  Procedure Laterality Date  . KNEE SURGERY Bilateral   . PERIPHERAL VASCULAR CATHETERIZATION Left 11/11/2015   Procedure: Thrombectomy;  Surgeon: Katha Cabal, MD;  Location: [facility];  Service: Cardiovascular;  Laterality: Left;  . PORTACATH PLACEMENT N/A 10/29/2015   Procedure: INSERTION PORT-A-CATH;  Surgeon: Nestor Lewandowsky, MD;  Location: [facility];  Service: General;  Laterality: N/A;  . TONSILLECTOMY      FAMILY HISTORY: Family History  Problem Relation Age of Onset  . Diabetes Father   . Hypertension Father     ADVANCED DIRECTIVES (Y/N):  '@ADVDIR'$ @  HEALTH MAINTENANCE: Social History  Substance Use Topics  . Smoking status: Former Smoker    Packs/day: 2.00    Years: 50.00    Types: Cigarettes    Quit date: 08/07/2013  . Smokeless tobacco: Never Used  . Alcohol use No     Comment: rarely     Colonoscopy:  PAP:  Bone density:  Lipid panel:  Allergies  Allergen Reactions  . Aleve [Naproxen Sodium] Hives, Shortness Of Breath and Swelling    Naproxin    Current Facility-Administered Medications  Medication Dose Route Frequency Provider Last Rate Last Dose  . acetaminophen (TYLENOL) tablet 650 mg  650 mg Oral Q6H PRN Lance Coon, MD   650 mg at 02/25/16 0244   Or  . acetaminophen (TYLENOL) suppository 650 mg  650 mg Rectal Q6H PRN Lance Coon, MD      . budesonide (PULMICORT) nebulizer solution 0.5 mg  0.5 mg Nebulization BID Vishal Mungal, MD   0.5 mg at 02/25/16 1950  . ceFEPIme (MAXIPIME) 2 g in dextrose 5 % 50 mL IVPB  2 g Intravenous Q8H Harvest Dark, MD    2 g at 02/25/16 1747  . fentaNYL (DURAGESIC - dosed mcg/hr) 12.5 mcg  12.5 mcg Transdermal Q72H Lance Coon, MD   12.5 mcg at 02/25/16 0918  . furosemide (LASIX) injection 40 mg  40 mg Intravenous BID Hillary Bow, MD   40 mg at 02/25/16 1747  . heparin ADULT infusion 100 units/mL (25000 units/282m sodium chloride 0.45%)  900 Units/hr Intravenous Continuous MHarrie Foreman MD 9 mL/hr at 02/25/16 0818 900 Units/hr at 02/25/16 0818  . insulin aspart (novoLOG) injection 0-5 Units  0-5 Units Subcutaneous QHS MHarrie Foreman MD      . insulin aspart (novoLOG) injection 0-9 Units  0-9 Units Subcutaneous Q6H MHarrie Foreman MD   2 Units at 02/25/16 1546  . ipratropium-albuterol (DUONEB) 0.5-2.5 (3) MG/3ML nebulizer solution 3 mL  3 mL Nebulization Q4H PRN DLance Coon MD      . ipratropium-albuterol (DUONEB) 0.5-2.5 (3) MG/3ML nebulizer solution 3 mL  3 mL Nebulization Q4H DAwilda Bill NP   3 mL at 02/25/16 1950  . methylPREDNISolone sodium succinate (SOLU-MEDROL) 125 mg/2 mL injection 60 mg  60 mg Intravenous Q8H Vishal Mungal, MD      . nitroGLYCERIN (NITROGLYN) 2 % ointment 0.5 inch  0.5 inch Topical Q6H MHarrie Foreman MD   0.5 inch at 02/25/16 1753  . ondansetron (ZOFRAN) tablet 8 mg  8 mg Oral Q8H PRN DLance Coon MD       Or  . ondansetron ([facility] 8 mg in sodium chloride 0.9 % 50 mL IVPB  8 mg Intravenous Q8H PRN DLance Coon MD      . oxyCODONE (Oxy IR/ROXICODONE) immediate release tablet 5 mg  5 mg Oral Q4H PRN DLance Coon MD   5 mg at 02/25/16 1920  . prochlorperazine (COMPAZINE) tablet 10 mg  10 mg Oral Q6H PRN DLance Coon MD      . sodium chloride flush (NS) 0.9 % injection 3 mL  3 mL Intravenous Q12H DLance Coon MD   3 mL at 02/25/16 00932  Facility-Administered Medications Ordered in Other Encounters  Medication Dose Route Frequency Provider Last Rate Last Dose  . heparin lock flush 100 unit/mL  500 Units Intravenous Once TLloyd Huger MD      . sodium  chloride flush (NS) 0.9 % injection 10 mL  10 mL Intravenous PRN TLloyd Huger MD        OBJECTIVE: Vitals:   02/25/16 1900 02/25/16 2000  BP: (!) 130/93 (!) 131/92  Pulse: (!) 116 (!) 112  Resp: (!) 22 19  Temp: 97.7 F (36.5 C)      Body mass index is 21.34 kg/m.    ECOG FS:3 - Symptomatic, >50% confined to bed  General: Well-developed, well-nourished, no acute distress. Eyes: Pink conjunctiva, anicteric sclera. HEENT: Normocephalic, moist mucous membranes, clear oropharnyx. Lungs: Clear to auscultation bilaterally. Heart: Regular rate and rhythm. No rubs, murmurs, or gallops. Abdomen: Soft, nontender, nondistended. No organomegaly noted, normoactive bowel sounds. Musculoskeletal: No edema,  cyanosis, or clubbing. Neuro: Alert, answering all questions appropriately. Cranial nerves grossly intact. Skin: No rashes or petechiae noted. Psych: Normal affect. Lymphatics: No cervical, calvicular, axillary or inguinal LAD.   LAB RESULTS:  Lab Results  Component Value Date   NA 134 (L) 02/25/2016   K 4.2 02/25/2016   CL 97 (L) 02/25/2016   CO2 24 02/25/2016   GLUCOSE 175 (H) 02/25/2016   BUN 40 (H) 02/25/2016   CREATININE 1.09 02/25/2016   CALCIUM 8.6 (L) 02/25/2016   PROT 6.7 03/02/2016   ALBUMIN 3.3 (L) 02/28/2016   AST 51 (H) 02/09/2016   ALT 51 03/04/2016   ALKPHOS 90 03/08/2016   BILITOT 0.8 02/08/2016   GFRNONAA >60 02/25/2016   GFRAA >60 02/25/2016    Lab Results  Component Value Date   WBC 51.7 (HH) 02/25/2016   NEUTROABS 22.6 (H) 02/06/2016   HGB 13.0 02/25/2016   HCT 38.8 (L) 02/25/2016   MCV 86.3 02/25/2016   PLT 89 (L) 02/25/2016     STUDIES: Dg Chest 2 View  Result Date: 02/06/2016 CLINICAL DATA:  Redness and swelling in the right lower extremity. Known lung cancer in the right upper lobe. EXAM: CHEST  2 VIEW COMPARISON:  November 04, 2015 FINDINGS: There was dense consolidation in the right upper lobe on the previous study which has improved.  There is now opacity throughout the right central lung, not seen previously. Scarring in the left apex. The left Port-A-Cath is stable. No pneumothorax. No change in the cardiomediastinal silhouette. No other interval changes. IMPRESSION: The dense consolidation in the right upper lobe is improved consistent with interval treatment. There is patchy opacity throughout the right central lung. Whether this is pneumonia or post treatment change is unclear on this single study. Recommend clinical correlation and follow-up to resolution. Electronically Signed   By: Dorise Bullion III M.D   On: 02/06/2016 15:48   Ct Head Wo Contrast  Result Date: 02/12/2016 CLINICAL DATA:  Headache, right arm pain. EXAM: CT HEAD WITHOUT CONTRAST TECHNIQUE: Contiguous axial images were obtained from the base of the skull through the vertex without intravenous contrast. COMPARISON:  MRI of October 19, 2015. FINDINGS: Brain: Large area of white matter edema is seen in the left parietal cortex concerning for underlying metastatic disease. Similar abnormality is seen in both frontal lobes, as well as left cerebellar hemisphere. No hemorrhage is noted. Ventricular size is within normal limits. No significant midline shift is noted at this time. Vascular: Atherosclerosis of internal carotid arteries is noted. Skull: Bony calvarium appears intact. Sinuses/Orbits: Visualized paranasal sinuses appear normal. Other: None. IMPRESSION: Multiple areas of white matter edema are noted, including the left parietal cortex, both frontal lobes and left cerebellar hemisphere. This is concerning for underlying metastatic disease, and further evaluation with MRI with and without gadolinium is recommended. Electronically Signed   By: Marijo Conception, M.D.   On: 02/12/2016 15:41   Ct Angio Chest Pe W And/or Wo Contrast  Result Date: 03/07/2016 CLINICAL DATA:  Initial evaluation for acute shortness of breath. History of lung carcinoma. EXAM: CT ANGIOGRAPHY  CHEST WITH CONTRAST TECHNIQUE: Multidetector CT imaging of the chest was performed using the standard protocol during bolus administration of intravenous contrast. Multiplanar CT image reconstructions and MIPs were obtained to evaluate the vascular anatomy. CONTRAST:  75 cc of Isovue 370. COMPARISON:  Prior MRI from 02/13/2016 as well as prior CT 09/22/2015. Comparison also made with prior radiograph from earlier the same day. FINDINGS: Cardiovascular: Intrathoracic  aorta of normal caliber without acute abnormality. Scattered atheromatous plaque within the intrathoracic aorta. Visualized great vessels demonstrate no acute abnormality. Mild cardiomegaly with left atrial enlargement. Three vessel coronary artery calcifications. The no pericardial effusion. Pulmonary arteries adequately opacified for evaluation. Main pulmonary artery measures within normal limits for size at 2.8 cm in diameter. Few small linear filling defects are present within the left lower lobe segmental arteries (series 5, image 175, 200, 223), consistent with small acute pulmonary emboli. No other definite focal filling defects are identified elsewhere within the lungs. No findings to suggest right heart strain. RV LV ratio within normal limits. Mediastinum/Nodes: Thyroid within normal limits. Right peritracheal lymph nodes measure up to 14 mm (series 4, images 34). Sub carinal nodal conglomerate measures 2.2 cm. Right hilar soft tissue fullness/nodal mass measures 3.9 x 2.2 cm. No left hilar adenopathy. Shotty sub cm prevascular and left paratracheal nodes noted. Mildly prominent right cardiophrenic nodes measure up to 9 mm (series 4, image 81). Esophagus within normal limits. No axillary adenopathy. Lungs/Pleura: Irregular mass like opacity at the right lung apex again seen, measuring approximately 5.4 x 7.2 x 6.5 cm on today's study. Multiple right upper lobe segmental artery seen coursing through the mass, which are somewhat attenuated and  narrowed. Mass approximates the right upper margin of the mediastinum, and may directly invade the mediastinum. Previously noted direct wall invasion better evaluated on recent MRI. Sub cm right supraclavicular lymph node noted. Irregular right pleural effusion is present. Extensive multifocal areas of ground-glass with interlobular septal thickening present throughout the lungs bilaterally (crazy paving), suspected to reflect pulmonary edema, superimposed on underlying emphysema. More confluent irregular areas of opacity within the right upper and mid lung may reflect atelectasis and/or areas of infection/infiltrate. Superimposed lymphangitic spread of tumor may be present as well. 19 mm pleural-based nodule noted at the mid right lung (series 6, image 64). Upper Abdomen: 11 mm hypodensity within the medial right hepatic lobe noted, stable, indeterminate. Remainder the visualized upper abdomen unremarkable. Musculoskeletal: No acute osseous abnormality. No worrisome lytic or blastic osseous lesions. Review of the MIP images confirms the above findings. IMPRESSION: 1. Few scattered small filling defects with faint left lower lobe segmental and subsegmental pulmonary arteries as above, consistent with acute pulmonary emboli. No evidence for right heart strain. 2. Persistent right apical lung mass, compatible with known primary bronchogenic carcinoma. Lesion is decreased in size from 09/22/2015, measuring approximately 5.4 x 7.3 x 6.5 cm on today's study. Possible direct mediastinal invasion again noted. Mediastinal and right hilar as above. 3. Increased patchy parenchymal opacity within the right upper and mid lung. While this finding may in part reflect atelectasis, possible postobstructive pneumonia could also be considered. 4. Patchy multifocal ground-glass opacity with interlobular septal thickening throughout the lungs, suspected to reflect pulmonary edema superimposed on underlying emphysema. 5. Left pleural  effusion. Critical Value/emergent results were called by telephone at the time of interpretation on 03/08/2016 at 11:05 pm to Dr. Harvest Dark , who verbally acknowledged these results. Electronically Signed   By: Jeannine Boga M.D.   On: 02/12/2016 23:10   Mr Jeri Cos GM Contrast  Result Date: 02/13/2016 CLINICAL DATA:  Headaches. RIGHT arm/ hand weakness and numbness. Lung cancer. EXAM: MRI HEAD WITHOUT AND WITH CONTRAST TECHNIQUE: Multiplanar, multiecho pulse sequences of the brain and surrounding structures were obtained without and with intravenous contrast. CONTRAST:  74m MULTIHANCE GADOBENATE DIMEGLUMINE 529 MG/ML IV SOLN COMPARISON:  CT head 02/12/2016 showing multiple abnormalities. MRI cervical  spine and MR brachial plexus also, reported separately. FINDINGS: Brain: No restricted diffusion attributed to infarction. No acute or chronic hemorrhage, hydrocephalus, or extra-axial fluid. Premature for age atrophy. Minor white matter disease. Extensive vasogenic edema associated with multiple intracranial metastases demonstrating postcontrast enhancement, described below: - 3 mm, RIGHT cerebellum, image 14. - 8 mm, LEFT cerebellum, image 16. - 3 mm, LEFT occipital, image 23. Also more laterally, sulcal enhancement, uncertain significance. - 5 mm, LEFT frontal parasagittal, image 26. - 4 mm, LEFT inferior frontal, image 26. - 9 mm, LEFT posterior temporal, image 27. - 8 mm, LEFT frontal, image 34. - 4 mm, RIGHT frontal, image 36. - 9 mm, LEFT anterior frontal, image 38. - 12 mm, index lesion, LEFT posterior frontal, image 42. Marked edema without significant midline shift. - 5 mm, LEFT superior frontal, image 44. - 4 mm, LEFT frontoparietal parasagittal, image 50. Vascular: Flow voids are maintained.  LEFT vertebral dominant. Skull and upper cervical spine: No osseous lesions. Cervical spine reported separately. Sinuses/Orbits: No layering sinus fluid.  No orbital mass. Other: None. IMPRESSION:  Greater than 10 intracranial metastatic deposits. The dominant lesion is in the LEFT posterior frontal lobe, 12 mm diameter. The patient's RIGHT arm symptoms likely derive from this mass and/or surrounding edema. Electronically Signed   By: Staci Righter M.D.   On: 02/13/2016 14:29   Mr Chest W Wo Contrast  Result Date: 02/13/2016 CLINICAL DATA:  Right arm pain.  History of lung cancer. EXAM: MRI CHEST WITHOUT AND WITH CONTRAST TECHNIQUE: Standard multiplanar MR imaging was performed of the right brachial plexus pre and post administration of gadolinium. CONTRAST:  24m MULTIHANCE GADOBENATE DIMEGLUMINE 529 MG/ML IV SOLN COMPARISON:  PET-CT 10/15/2015 and chest CT 09/22/2015 FINDINGS: Large apical lung mass is again demonstrated. MR findings are highly suspicious for chest wall invasion and rib involvement. I do not however see any definite involvement of the brachial plexus structures which are surrounded by fat and do not demonstrate any mass effect, inflammation or enhancement. Small supraclavicular and right subclavicular lymph nodes are noted. The largest node measures approximately 12 mm and is located adjacent to the trachea (best seen on series 3, image 25). Mild edema like signal abnormality and enhancement in the right chest wall musculature. No significant findings in the cervical spine. IMPRESSION: 1. Large right apical lung mass with findings highly suspicious for direct chest wall invasion and rib involvement. 2. No definite involvement of the brachial plexus structures. Electronically Signed   By: PMarijo SanesM.D.   On: 02/13/2016 13:32   Mr Cervical Spine W Wo Contrast  Result Date: 02/11/2016 CLINICAL DATA:  RIGHT hand weakness and numbness. Symptoms for 2 weeks. No injury. Lung cancer. EXAM: MRI CERVICAL SPINE WITHOUT AND WITH CONTRAST TECHNIQUE: Multiplanar and multiecho pulse sequences of the cervical spine, to include the craniocervical junction and cervicothoracic junction, were  obtained according to standard protocol without and with intravenous contrast. CONTRAST:  16mMULTIHANCE GADOBENATE DIMEGLUMINE 529 MG/ML IV SOLN COMPARISON:  PET scan 10/15/2015.  Chest radiograph 02/06/2016. FINDINGS: Alignment: Anatomic Vertebrae: Fatty replaced marrow at T1, T2, related to XRT. No definite tumor involvement. Cord: No cord compression or abnormal cord signal. Posterior Fossa: No visible tonsillar herniation. Vertebral Arteries: Are patent, LEFT dominant. Paraspinal tissues: Incompletely evaluated on this cervical spine MR is a suspected large RIGHT paravertebral mass, which would correspond to regional spread of tumor at the RIGHT lung apex. See sagittal image 1, arrows, multiple series. This area is not covered  with the provided axial images. Concern is raised for brachial plexus involvement given the symptoms of RIGHT hand weakness and numbness. Disc levels: C2-3:  Normal. C3-4: Mild facet arthropathy. Slight LEFT-sided uncinate spurring. No C4 foraminal narrowing. C4-5: Shallow central protrusion. Mild facet arthropathy. LEFT-sided uncinate spurring. LEFT C5 foraminal narrowing. C5-6:  Annular bulge.  No impingement. C6-7: Central protrusion extends more to the LEFT than RIGHT. Superimposed uncinate spurring and asymmetric LEFT-sided facet arthropathy. LEFT C7 foraminal narrowing. C7-T1:  Unremarkable. IMPRESSION: Mild cervical spondylosis, without significant RIGHT-sided neural impingement. Suspected RIGHT paravertebral mass, incompletely evaluated, consistent with regional involvement by the patient's known RIGHT lung cancer. Recommend MRI of the brachial plexus without and with contrast for further evaluation, which could be performed anytime after 10 a.m. on 02/13/2016 to allow sufficient gadolinium elimination. Electronically Signed   By: Staci Righter M.D.   On: 02/11/2016 12:01   US Venous Img Lower Unilateral Right  Result Date: 02/06/2016 CLINICAL DATA:  Air edema and pain for 1  day in the right lower extremity EXAM: RIGHT LOWER EXTREMITY VENOUS DUPLEX ULTRASOUND TECHNIQUE: Doppler venous assessment of the left lower extremity deep venous system was performed, including characterization of spectral flow, compressibility, and phasicity. COMPARISON:  None. FINDINGS: There is complete compressibility of the right common femoral, femoral, and popliteal veins. Doppler analysis demonstrates respiratory phasicity and augmentation of flow with calf compression. No obvious calf vein thrombosis. There is occlusive thrombus in the right great saphenous vein beginning in the distal thigh to the distal calf. Incidental imaging of the left common femoral vein demonstrates nonocclusive thrombus within the left common femoral vein. IMPRESSION: There is no evidence of DVT in the right lower extremity There is superficial occlusive thrombus in the right greater saphenous vein as described There is partially occlusive DVT in the left common femoral vein. Compared to the prior study dated 11/07/2015, this has slightly improved. Electronically Signed   By: Marybelle Killings M.D.   On: 02/06/2016 14:53   Dg Chest Portable 1 View  Result Date: 02/07/2016 CLINICAL DATA:  Shortness of breath and chest pain.  Lung carcinoma. EXAM: PORTABLE CHEST 1 VIEW COMPARISON:  February 06, 2016 FINDINGS: There is new airspace opacity in the right mid lung, concerning for acute pneumonia. There is widespread fibrosis bilaterally, much more severe on the right than on the left. There is asymmetric right apical pleural thickening,, likely due to radiation therapy change. There may be residual mass in this area. Fullness in the right hilum raises concern for adenopathy. Heart size is normal. Pulmonary vascularity is stable with some distortion due to the extensive fibrosis. Port-A-Cath tip is in the left innominate vein. No pneumothorax. No blastic or lytic bone lesions are evident. IMPRESSION: Suspect pneumonia right mid lung.  Extensive fibrosis is noted bilaterally, more on the right than on the left. Suspect radiation therapy change in the right apex. Residual mass in this area may well be present. There is concern for right hilar adenopathy. Stable cardiac silhouette. Port-A-Cath tip in left innominate vein. No pneumothorax. Electronically Signed   By: Lowella Grip III M.D.   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  PLAN:    1. 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 He is currently receiving XRT to his brain, but this will be placed on hold until  his acute symptoms resolve. No further treatments are planned at this time. Given his declining performance status and acute MI additional treatment will be difficult in the short-term. If patient fully recovers could possibly consider reinitiating treatment, but this is unlikely. CT of the chest reveals improved disease, although there is concern of possible lymphangitic spread. Agree with palliative care consult to assess goals of care. 2. Acute MI: Troponin still trending up. By report, patient refusing cardiac cath.  Appreciate cardiology input. 3. Shortness breath: Likely multifactorial including acute MI, underlying lung cancer, and possible superimposed pneumonia. Patient remains on high flow oxygen. Appreciate pulmonary input. 4. Thrombocytopenia: Possibly residual from chemotherapy.  Mild, monitor. 5. Leukocytosis: Likely reactive and multifactorial with acute MI and steroids. Monitor.  Appreciate consult. I will be in the [facility] clinic on Friday please call with any further questions.   Lloyd Huger, MD   02/25/2016 8:11 PM

## 2016-02-25 NOTE — ED Notes (Signed)
RT at the bedside placing BIPAP. O2 88% on 15 L. Increase in work to breathe. Resp 35-45. HR coming down 109-110.

## 2016-02-25 NOTE — Progress Notes (Signed)
Patient alert and oriented, vital signs stable except diastolic hypertension at times, Dr Humphrey Rolls and Dr Stevenson Clinch aware. Patient now on HFNC at 100%, tolerated dinner well.  Heparin gtt at 87m/hr infusing without difficulty.  PRN pain med administered x2 for c/o of headache with improvement.  Patient voiding via urinal.  Patient currently in no apparent distress, appreciative.  Report given to night RN with no futher questions.

## 2016-02-25 NOTE — Progress Notes (Signed)
ANTICOAGULATION CONSULT NOTE - Initial Consult  Pharmacy Consult for heparin drip Indication: ACS/STEMI/NSTEMI  Allergies  Allergen Reactions  . Aleve [Naproxen Sodium] Hives, Shortness Of Breath and Swelling    Naproxin    Patient Measurements: Height: '6\' 2"'$  (188 cm) Weight: 166 lb 3.6 oz (75.4 kg) IBW/kg (Calculated) : 82.2 Heparin Dosing Weight: 75kg  Vital Signs: Temp: 98.2 F (36.8 C) (10/19 0500) Temp Source: Oral (10/19 0500) BP: 149/105 (10/19 1200) Pulse Rate: 122 (10/19 1200)  Labs:  Recent Labs  03/07/2016 2054 02/25/16 0413 02/25/16 0644 02/25/16 1035 02/25/16 1433  HGB 12.8*  --   --  13.0  --   HCT 39.2*  --   --  38.8*  --   PLT 103*  --   --  89*  --   APTT  --   --  24  --  66*  LABPROT  --   --  16.9*  --   --   INR  --   --  1.36  --   --   HEPARINUNFRC  --   --  0.82*  --   --   CREATININE 1.07  --   --  1.09  --   TROPONINI 0.52* 4.15*  --  12.34* 19.65*    Estimated Creatinine Clearance: 73 mL/min (by C-G formula based on SCr of 1.09 mg/dL).   Medical History: Past Medical History:  Diagnosis Date  . Arthritis   . Atrial fibrillation ([place])   . Cancer ([place]) 2017   Lung  . CHF (congestive heart failure) ([place])   . Chronic kidney disease   . COPD (chronic obstructive pulmonary disease) ([place])   . Disease of lung   . DVT (deep venous thrombosis) ([facility])    left leg  . Dysrhythmia   . Headache   . History of chemotherapy   . History of radiation therapy   . Hypertension   . Pneumonia     Medications:  Pharmacy consulted to dose and monitor heparin drip in this 65 year old male admitted with ACS/NSTEMI.  On apixaban as outpatient so will need to dose off of aPTT until aPTT and HL coincide.  Assessment:  Goal of Therapy:  Heparin level 0.3-0.7 units/ml  APTT 66 - 102 seconds Monitor platelets by anticoagulation protocol: Yes   Plan:  10/19 aPTT @ 1433 = 66 seconds, which is therapeutic. Will continue heparin drip at current rate  of 900 units/hr and check confirmatory aPTT in 6 hours.   CBC to be checked daily  Lenis Noon, PharmD Clinical Pharmacist 02/25/2016,4:47 PM

## 2016-02-25 NOTE — ED Notes (Signed)
Pt color improving, pts breathing improving on BIPap, Pt continues to use the urinal. HR improving after IVP meds. Will continue to monitor.

## 2016-02-25 NOTE — Progress Notes (Signed)
Sean Hall is a 65 y.o. male  [id number]  Primary Cardiologist: Neoma Laming Reason for Consultation: Chest pain and elevated troponin  HPI: This is a 65 year old white male with a past medical history of lung cancer with metastasis to the brain presented to the hospital with severe shortness of breath orthopnea PND and chest pain. CT of the chest revealed possible small amount of pulmonary embolism and a mass in the lung in the apex which is due to prior lung cancer. His troponins were elevated from 0.5-4.0 and I was asked to evaluate the patient. Patient still has some chest pain and shortness of breath.   Review of Systems: Does have some shortness of breath orthopnea PND and chest pain   Past Medical History:  Diagnosis Date  . Arthritis   . Atrial fibrillation ([place])   . Cancer ([place]) 2017   Lung  . CHF (congestive heart failure) ([place])   . Chronic kidney disease   . COPD (chronic obstructive pulmonary disease) ([place])   . Disease of lung   . DVT (deep venous thrombosis) ([facility])    left leg  . Dysrhythmia   . Headache   . History of chemotherapy   . History of radiation therapy   . Hypertension   . Pneumonia     Medications Prior to Admission  Medication Sig Dispense Refill  . albuterol (PROAIR HFA) 108 (90 Base) MCG/ACT inhaler Inhale 1-2 puffs into the lungs every 4 (four) hours as needed. 1 Inhaler 3  . apixaban (ELIQUIS) 5 MG TABS tablet Take 1 tablet (5 mg total) by mouth 2 (two) times daily. 180 tablet 3  . dexamethasone (DECADRON) 4 MG tablet Take 1 tablet (4 mg total) by mouth 3 (three) times daily. 90 tablet 1  . fentaNYL (DURAGESIC - DOSED MCG/HR) 12 MCG/HR Place 1 patch (12.5 mcg total) onto the skin every 3 (three) days. 5 patch 0  . lidocaine-prilocaine (EMLA) cream Apply to affected area once 30 g 3  . Multiple Vitamin (MULTI-VITAMINS) TABS Take 1 tablet by mouth daily as needed.     . ondansetron (ZOFRAN) 8 MG tablet Take 1 tablet (8 mg total) by mouth 2  (two) times daily as needed for refractory nausea / vomiting. Start on day 3 after chemo. 30 tablet 1  . prochlorperazine (COMPAZINE) 10 MG tablet Take 1 tablet (10 mg total) by mouth every 6 (six) hours as needed (Nausea or vomiting). 30 tablet 1     . ceFEPime (MAXIPIME) IV  2 g Intravenous Q8H  . fentaNYL  12.5 mcg Transdermal Q72H  . insulin aspart  0-5 Units Subcutaneous QHS  . insulin aspart  0-9 Units Subcutaneous Q6H  . levofloxacin (LEVAQUIN) IV  750 mg Intravenous Q24H  . methylPREDNISolone (SOLU-MEDROL) injection  60 mg Intravenous Q6H  . nitroGLYCERIN  0.5 inch Topical Q6H  . sodium chloride flush  3 mL Intravenous Q12H    Infusions: . heparin 900 Units/hr (02/25/16 0818)    Allergies  Allergen Reactions  . Aleve [Naproxen Sodium] Hives, Shortness Of Breath and Swelling    Naproxin    Social History   Social History  . Marital status: Single    Spouse name: N/A  . Number of children: N/A  . Years of education: N/A   Occupational History  . Not on file.   Social History Main Topics  . Smoking status: Former Smoker    Packs/day: 2.00    Years: 50.00    Types: Cigarettes  Quit date: 08/07/2013  . Smokeless tobacco: Never Used  . Alcohol use No     Comment: rarely  . Drug use: No  . Sexual activity: Yes   Other Topics Concern  . Not on file   Social History Narrative  . No narrative on file    Family History  Problem Relation Age of Onset  . Diabetes Father   . Hypertension Father     PHYSICAL EXAM: Vitals:   02/25/16 0600 02/25/16 0700  BP: (!) 137/95 (!) 142/99  Pulse: (!) 105 (!) 113  Resp: (!) 21 (!) 24  Temp:       Intake/Output Summary (Last 24 hours) at 02/25/16 0856 Last data filed at 02/25/16 0800  Gross per 24 hour  Intake              200 ml  Output             2080 ml  Net            -1880 ml    General:  Well appearing. No respiratory difficulty HEENT: normal Neck: supple. no JVD. Carotids 2+ bilat; no bruits. No  lymphadenopathy or thryomegaly appreciated. Cor: PMI nondisplaced. Regular rate & rhythm. No rubs, gallops or murmurs. Lungs: clear Abdomen: soft, nontender, nondistended. No hepatosplenomegaly. No bruits or masses. Good bowel sounds. Extremities: no cyanosis, clubbing, rash, edema Neuro: alert & oriented x 3, cranial nerves grossly intact. moves all 4 extremities w/o difficulty. Affect pleasant.  IOE:VOJJK tachycardia with old anteroseptal wall MI and ST elevation about half millimeter in the inferior leads suggestive of ischemic ST changes.  Results for orders placed or performed during the hospital encounter of 02/16/2016 (from the past 24 hour(s))  CBC     Status: Abnormal   Collection Time: 02/15/2016  8:54 PM  Result Value Ref Range   WBC 44.3 (H) 3.8 - 10.6 K/uL   RBC 4.47 4.40 - 5.90 MIL/uL   Hemoglobin 12.8 (L) 13.0 - 18.0 g/dL   HCT 39.2 (L) 40.0 - 52.0 %   MCV 87.7 80.0 - 100.0 fL   MCH 28.7 26.0 - 34.0 pg   MCHC 32.7 32.0 - 36.0 g/dL   RDW 23.0 (H) 11.5 - 14.5 %   Platelets 103 (L) 150 - 440 K/uL  Comprehensive metabolic panel     Status: Abnormal   Collection Time: 02/17/2016  8:54 PM  Result Value Ref Range   Sodium 132 (L) 135 - 145 mmol/L   Potassium 4.0 3.5 - 5.1 mmol/L   Chloride 96 (L) 101 - 111 mmol/L   CO2 24 22 - 32 mmol/L   Glucose, Bld 199 (H) 65 - 99 mg/dL   BUN 34 (H) 6 - 20 mg/dL   Creatinine, Ser 1.07 0.61 - 1.24 mg/dL   Calcium 8.8 (L) 8.9 - 10.3 mg/dL   Total Protein 6.7 6.5 - 8.1 g/dL   Albumin 3.3 (L) 3.5 - 5.0 g/dL   AST 51 (H) 15 - 41 U/L   ALT 51 17 - 63 U/L   Alkaline Phosphatase 90 38 - 126 U/L   Total Bilirubin 0.8 0.3 - 1.2 mg/dL   GFR calc non Af Amer >60 >60 mL/min   GFR calc Af Amer >60 >60 mL/min   Anion gap 12 5 - 15  Troponin I     Status: Abnormal   Collection Time: 02/09/2016  8:54 PM  Result Value Ref Range   Troponin I 0.52 (HH) <0.03 ng/mL  Blood  culture (routine x 2)     Status: None (Preliminary result)   Collection Time:  02/28/2016 10:56 PM  Result Value Ref Range   Specimen Description BLOOD  RIGHT AC    Special Requests      BOTTLES DRAWN AEROBIC AND ANAEROBIC  ANA 13ML AER 14ML   Culture NO GROWTH < 12 HOURS    Report Status PENDING   Blood culture (routine x 2)     Status: None (Preliminary result)   Collection Time: 02/20/2016 10:56 PM  Result Value Ref Range   Specimen Description BLOOD  LEFT AC    Special Requests      BOTTLES DRAWN AEROBIC AND ANAEROBIC  ANA 4ML AER 4ML   Culture NO GROWTH < 12 HOURS    Report Status PENDING   Glucose, capillary     Status: Abnormal   Collection Time: 02/25/16  2:27 AM  Result Value Ref Range   Glucose-Capillary 206 (H) 65 - 99 mg/dL  MRSA PCR Screening     Status: None   Collection Time: 02/25/16  2:38 AM  Result Value Ref Range   MRSA by PCR NEGATIVE NEGATIVE  Troponin I     Status: Abnormal   Collection Time: 02/25/16  4:13 AM  Result Value Ref Range   Troponin I 4.15 (HH) <0.03 ng/mL  APTT     Status: None   Collection Time: 02/25/16  6:44 AM  Result Value Ref Range   aPTT 24 24 - 36 seconds  Protime-INR     Status: Abnormal   Collection Time: 02/25/16  6:44 AM  Result Value Ref Range   Prothrombin Time 16.9 (H) 11.4 - 15.2 seconds   INR 1.36   Heparin level (unfractionated)     Status: Abnormal   Collection Time: 02/25/16  6:44 AM  Result Value Ref Range   Heparin Unfractionated 0.82 (H) 0.30 - 0.70 IU/mL   Ct Angio Chest Pe W And/or Wo Contrast  Result Date: 02/21/2016 CLINICAL DATA:  Initial evaluation for acute shortness of breath. History of lung carcinoma. EXAM: CT ANGIOGRAPHY CHEST WITH CONTRAST TECHNIQUE: Multidetector CT imaging of the chest was performed using the standard protocol during bolus administration of intravenous contrast. Multiplanar CT image reconstructions and MIPs were obtained to evaluate the vascular anatomy. CONTRAST:  75 cc of Isovue 370. COMPARISON:  Prior MRI from 02/13/2016 as well as prior CT 09/22/2015. Comparison  also made with prior radiograph from earlier the same day. FINDINGS: Cardiovascular: Intrathoracic aorta of normal caliber without acute abnormality. Scattered atheromatous plaque within the intrathoracic aorta. Visualized great vessels demonstrate no acute abnormality. Mild cardiomegaly with left atrial enlargement. Three vessel coronary artery calcifications. The no pericardial effusion. Pulmonary arteries adequately opacified for evaluation. Main pulmonary artery measures within normal limits for size at 2.8 cm in diameter. Few small linear filling defects are present within the left lower lobe segmental arteries (series 5, image 175, 200, 223), consistent with small acute pulmonary emboli. No other definite focal filling defects are identified elsewhere within the lungs. No findings to suggest right heart strain. RV LV ratio within normal limits. Mediastinum/Nodes: Thyroid within normal limits. Right peritracheal lymph nodes measure up to 14 mm (series 4, images 34). Sub carinal nodal conglomerate measures 2.2 cm. Right hilar soft tissue fullness/nodal mass measures 3.9 x 2.2 cm. No left hilar adenopathy. Shotty sub cm prevascular and left paratracheal nodes noted. Mildly prominent right cardiophrenic nodes measure up to 9 mm (series 4, image 81). Esophagus within normal limits.  No axillary adenopathy. Lungs/Pleura: Irregular mass like opacity at the right lung apex again seen, measuring approximately 5.4 x 7.2 x 6.5 cm on today's study. Multiple right upper lobe segmental artery seen coursing through the mass, which are somewhat attenuated and narrowed. Mass approximates the right upper margin of the mediastinum, and may directly invade the mediastinum. Previously noted direct wall invasion better evaluated on recent MRI. Sub cm right supraclavicular lymph node noted. Irregular right pleural effusion is present. Extensive multifocal areas of ground-glass with interlobular septal thickening present throughout  the lungs bilaterally (crazy paving), suspected to reflect pulmonary edema, superimposed on underlying emphysema. More confluent irregular areas of opacity within the right upper and mid lung may reflect atelectasis and/or areas of infection/infiltrate. Superimposed lymphangitic spread of tumor may be present as well. 19 mm pleural-based nodule noted at the mid right lung (series 6, image 64). Upper Abdomen: 11 mm hypodensity within the medial right hepatic lobe noted, stable, indeterminate. Remainder the visualized upper abdomen unremarkable. Musculoskeletal: No acute osseous abnormality. No worrisome lytic or blastic osseous lesions. Review of the MIP images confirms the above findings. IMPRESSION: 1. Few scattered small filling defects with faint left lower lobe segmental and subsegmental pulmonary arteries as above, consistent with acute pulmonary emboli. No evidence for right heart strain. 2. Persistent right apical lung mass, compatible with known primary bronchogenic carcinoma. Lesion is decreased in size from 09/22/2015, measuring approximately 5.4 x 7.3 x 6.5 cm on today's study. Possible direct mediastinal invasion again noted. Mediastinal and right hilar as above. 3. Increased patchy parenchymal opacity within the right upper and mid lung. While this finding may in part reflect atelectasis, possible postobstructive pneumonia could also be considered. 4. Patchy multifocal ground-glass opacity with interlobular septal thickening throughout the lungs, suspected to reflect pulmonary edema superimposed on underlying emphysema. 5. Left pleural effusion. Critical Value/emergent results were called by telephone at the time of interpretation on 02/27/2016 at 11:05 pm to Dr. Harvest Dark , who verbally acknowledged these results. Electronically Signed   By: Jeannine Boga M.D.   On: 03/05/2016 23:10   Dg Chest Portable 1 View  Result Date: 03/05/2016 CLINICAL DATA:  Shortness of breath and chest  pain.  Lung carcinoma. EXAM: PORTABLE CHEST 1 VIEW COMPARISON:  February 06, 2016 FINDINGS: There is new airspace opacity in the right mid lung, concerning for acute pneumonia. There is widespread fibrosis bilaterally, much more severe on the right than on the left. There is asymmetric right apical pleural thickening,, likely due to radiation therapy change. There may be residual mass in this area. Fullness in the right hilum raises concern for adenopathy. Heart size is normal. Pulmonary vascularity is stable with some distortion due to the extensive fibrosis. Port-A-Cath tip is in the left innominate vein. No pneumothorax. No blastic or lytic bone lesions are evident. IMPRESSION: Suspect pneumonia right mid lung. Extensive fibrosis is noted bilaterally, more on the right than on the left. Suspect radiation therapy change in the right apex. Residual mass in this area may well be present. There is concern for right hilar adenopathy. Stable cardiac silhouette. Port-A-Cath tip in left innominate vein. No pneumothorax. Electronically Signed   By: Lowella Grip III M.D.   On: 03/04/2016 21:54     ASSESSMENT AND PLAN: Patient is having acute myocardial infarction with ST and T changes in the inferior leads and elevated troponin of 4 but patient has multiple medical problems including pulmonary embolism now and has been on anticoagulants Xarelto for atrial fibrillation  with the last HOUR Xarelto 7 AM yesterday. Patient cannot have cardiac catheterization for 48 hours which would be tomorrow after 7 AM. Currently patient is on BiPAP and is quite short of breath with with crepitation at the bases suggestive of CHF. Patient was given option of cardiac catheterization when it is possible to do it would be tomorrow afternoon and he has refused..Discussed this with the primary care provider hospitalists and since he is a DO NOT RESUSCITATE will discuss with oncology before proceeding with any invasive workup. In the  meantime we'll continue heparin and will give other medical treatment for non-STEMI. We will get an echocardiogram to evaluate wall motion and ejection fraction. `  Teruo Stilley A

## 2016-02-25 NOTE — ED Notes (Signed)
Pt continues to decompensate. HR into 160s and O2 low 80s with 11L. Increased O2 to 15 L 87% Paged Sung MD to eval for BIPAP placement. Pt given '10mg'$  of Labetalol just given.

## 2016-02-25 NOTE — Consult Note (Signed)
PULMONARY / CRITICAL CARE MEDICINE   Name: Sean Hall MRN: [id number] DOB: 1950/08/12    ADMISSION DATE:  03/07/2016 CONSULTATION DATE:  03/06/2016  REFERRING MD:  Dr. 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  He presented to [facility] ER on 10/18 with c/o chest pain and shortness of breath.  Per ED notes the chest pain was all across his chest and radiated to the left arm.  In the ED he was tachycardic, tachypneic, with leukocytosis however he has been on outpatient steroids as a part of his cancer regimen.  Per oncology notes on 02/14/2016 the plan is continuation of decadron with plan for whole brain radiation due to mets to the brain. Also, per oncology notes there is an anticipation for a repeat PET scan in the outpatient department to restage systemic disease. If systemic progression is present then oncologist will anticipate targeted therapy based on mutational analysis.He has been on outpatient eliquis for an extensive left lower extremity DVT diagnosed 11/2015.  This admission CT of chest revealed a PE as well as groundglass opacities consistent with pneumonia vs.edema and he was subsequently admitted to the telemetry unit.  Due to worsening respiratory failure he was transferred to ICU 10/18 and placed on Bipap.  PCCM consulted 10/19 for further management of acute on chronic hypoxic respiratory failure secondary to HCAP, AECOPD, LLL PE, and Malignant Neoplasm of RLL.    PAST MEDICAL HISTORY :  He  has a past medical history of Arthritis; Atrial fibrillation ([place]); Cancer [facility]) (2017); CHF (congestive heart failure) ([place]); Chronic kidney disease; COPD (chronic obstructive  pulmonary disease) ([place]); Disease of lung; DVT (deep venous thrombosis) ([place]); Dysrhythmia; Headache; History of chemotherapy; History of radiation therapy; Hypertension; and Pneumonia.  PAST SURGICAL HISTORY: He  has a past surgical history that includes Knee surgery (Bilateral); Tonsillectomy; Portacath placement (N/A, 10/29/2015); and Cardiac catheterization (Left, 11/11/2015).  Allergies  Allergen Reactions  . Aleve [Naproxen Sodium] Hives, Shortness Of Breath and Swelling    Naproxin    Current Facility-Administered Medications on File Prior to Encounter  Medication  . heparin lock flush 100 unit/mL  . sodium chloride flush (NS) 0.9 % injection 10 mL   Current Outpatient Prescriptions on File Prior to Encounter  Medication Sig  . albuterol (PROAIR HFA) 108 (90 Base) MCG/ACT inhaler Inhale 1-2 puffs into the lungs every 4 (four) hours as needed.  Marland Kitchen apixaban (ELIQUIS) 5 MG TABS tablet Take 1 tablet (5 mg total) by mouth 2 (two) times daily.  Marland Kitchen dexamethasone (DECADRON) 4 MG tablet Take 1 tablet (4 mg total) by mouth 3 (three) times daily.  . fentaNYL (DURAGESIC - DOSED MCG/HR) 12 MCG/HR Place 1 patch (12.5 mcg total) onto the skin every 3 (three) days.  Marland Kitchen lidocaine-prilocaine (EMLA) cream Apply to affected area once  . Multiple Vitamin (MULTI-VITAMINS) TABS Take 1 tablet by mouth daily as needed.   . ondansetron (ZOFRAN) 8 MG tablet Take 1 tablet (8 mg total) by mouth 2 (two) times daily as needed for refractory nausea / vomiting. Start on day 3 after chemo.  . prochlorperazine (COMPAZINE) 10 MG tablet Take 1 tablet (10 mg total) by mouth every 6 (six) hours as needed (Nausea or  vomiting).    FAMILY HISTORY:  His indicated that his mother is alive. He indicated that his father is deceased.    SOCIAL HISTORY: He  reports that he quit smoking about 2 years ago. His smoking use included Cigarettes. He has a 100.00 pack-year smoking history. He has never used smokeless tobacco. He reports  that he does not drink alcohol or use drugs.  REVIEW OF SYSTEMS:  Positives in BOLD Gen: fever, chills, weight change, fatigue, night sweats HEENT: Denies blurred vision, double vision, hearing loss, tinnitus, sinus congestion, rhinorrhea, sore throat, neck stiffness, dysphagia PULM: shortness of breath, cough, sputum production, hemoptysis, wheezing CV: chest pain, edema, orthopnea, paroxysmal nocturnal dyspnea, palpitations GI: Denies abdominal pain, nausea, vomiting, diarrhea, hematochezia, melena, constipation, change in bowel habits GU: Denies dysuria, hematuria, polyuria, oliguria, urethral discharge Endocrine: Denies hot or cold intolerance, polyuria, polyphagia or appetite change Derm: Denies rash, dry skin, scaling or peeling skin change Heme: Denies easy bruising, bleeding, bleeding gums Neuro: Denies headache, numbness, weakness, slurred speech, loss of memory or consciousness  SUBJECTIVE:  No current complaints at this time pt remains on Bipap.    VITAL SIGNS: BP (!) 149/105   Pulse (!) 122   Temp 98.2 F (36.8 C) (Oral)   Resp (!) 31   Ht '6\' 2"'$  (1.88 m)   Wt 166 lb 3.6 oz (75.4 kg)   SpO2 95%   BMI 21.34 kg/m   HEMODYNAMICS:    VENTILATOR SETTINGS: FiO2 (%):  [100 %] 100 %  INTAKE / OUTPUT: I/O last 3 completed shifts: In: 200 [IV Piggyback:200] Out: 1880 [Urine:1880]  PHYSICAL EXAMINATION: General:  Chronically ill appearing Caucasian male Neuro: alert and oriented, follows commands right pupil 3 mm reactive, left pupil 2 mm reactive HEENT:  Supple, no JVD Cardiovascular:  Sinus tachycardia, s1s2, no M/R/G Lungs:  Rhonchi throughout, even, non labored  Abdomen:  +BS x4, soft, non distended, non tender Musculoskeletal:  Normal bulk and tone Skin:  Intact no rashes or lesions  LABS:  BMET  Recent Labs Lab 02/29/2016 2054 02/25/16 1035  NA 132* 134*  K 4.0 4.2  CL 96* 97*  CO2 24 24  BUN 34* 40*  CREATININE 1.07 1.09  GLUCOSE 199* 175*     Electrolytes  Recent Labs Lab 02/10/2016 2054 02/25/16 1035  CALCIUM 8.8* 8.6*    CBC  Recent Labs Lab 02/21/2016 2054 02/25/16 1035  WBC 44.3* 51.7*  HGB 12.8* 13.0  HCT 39.2* 38.8*  PLT 103* 89*    Coag's  Recent Labs Lab 02/25/16 0644  APTT 24  INR 1.36    Sepsis Markers No results for input(s): LATICACIDVEN, PROCALCITON, O2SATVEN in the last 168 hours.  ABG No results for input(s): PHART, PCO2ART, PO2ART in the last 168 hours.  Liver Enzymes  Recent Labs Lab 02/08/2016 2054  AST 51*  ALT 51  ALKPHOS 90  BILITOT 0.8  ALBUMIN 3.3*    Cardiac Enzymes  Recent Labs Lab 02/27/2016 2054 02/25/16 0413 02/25/16 1035  TROPONINI 0.52* 4.15* 12.34*    Glucose  Recent Labs Lab 02/25/16 0227 02/25/16 0910  GLUCAP 206* 171*    Imaging Ct Angio Chest Pe W And/or Wo Contrast  Result Date: 03/03/2016 CLINICAL DATA:  Initial evaluation for acute shortness of breath. History of lung carcinoma. EXAM: CT ANGIOGRAPHY CHEST WITH CONTRAST TECHNIQUE: Multidetector CT imaging of the chest was performed using the standard protocol during bolus administration of intravenous contrast. Multiplanar CT image reconstructions and MIPs were obtained to evaluate  the vascular anatomy. CONTRAST:  75 cc of Isovue 370. COMPARISON:  Prior MRI from 02/13/2016 as well as prior CT 09/22/2015. Comparison also made with prior radiograph from earlier the same day. FINDINGS: Cardiovascular: Intrathoracic aorta of normal caliber without acute abnormality. Scattered atheromatous plaque within the intrathoracic aorta. Visualized great vessels demonstrate no acute abnormality. Mild cardiomegaly with left atrial enlargement. Three vessel coronary artery calcifications. The no pericardial effusion. Pulmonary arteries adequately opacified for evaluation. Main pulmonary artery measures within normal limits for size at 2.8 cm in diameter. Few small linear filling defects are present within the left  lower lobe segmental arteries (series 5, image 175, 200, 223), consistent with small acute pulmonary emboli. No other definite focal filling defects are identified elsewhere within the lungs. No findings to suggest right heart strain. RV LV ratio within normal limits. Mediastinum/Nodes: Thyroid within normal limits. Right peritracheal lymph nodes measure up to 14 mm (series 4, images 34). Sub carinal nodal conglomerate measures 2.2 cm. Right hilar soft tissue fullness/nodal mass measures 3.9 x 2.2 cm. No left hilar adenopathy. Shotty sub cm prevascular and left paratracheal nodes noted. Mildly prominent right cardiophrenic nodes measure up to 9 mm (series 4, image 81). Esophagus within normal limits. No axillary adenopathy. Lungs/Pleura: Irregular mass like opacity at the right lung apex again seen, measuring approximately 5.4 x 7.2 x 6.5 cm on today's study. Multiple right upper lobe segmental artery seen coursing through the mass, which are somewhat attenuated and narrowed. Mass approximates the right upper margin of the mediastinum, and may directly invade the mediastinum. Previously noted direct wall invasion better evaluated on recent MRI. Sub cm right supraclavicular lymph node noted. Irregular right pleural effusion is present. Extensive multifocal areas of ground-glass with interlobular septal thickening present throughout the lungs bilaterally (crazy paving), suspected to reflect pulmonary edema, superimposed on underlying emphysema. More confluent irregular areas of opacity within the right upper and mid lung may reflect atelectasis and/or areas of infection/infiltrate. Superimposed lymphangitic spread of tumor may be present as well. 19 mm pleural-based nodule noted at the mid right lung (series 6, image 64). Upper Abdomen: 11 mm hypodensity within the medial right hepatic lobe noted, stable, indeterminate. Remainder the visualized upper abdomen unremarkable. Musculoskeletal: No acute osseous abnormality.  No worrisome lytic or blastic osseous lesions. Review of the MIP images confirms the above findings. IMPRESSION: 1. Few scattered small filling defects with faint left lower lobe segmental and subsegmental pulmonary arteries as above, consistent with acute pulmonary emboli. No evidence for right heart strain. 2. Persistent right apical lung mass, compatible with known primary bronchogenic carcinoma. Lesion is decreased in size from 09/22/2015, measuring approximately 5.4 x 7.3 x 6.5 cm on today's study. Possible direct mediastinal invasion again noted. Mediastinal and right hilar as above. 3. Increased patchy parenchymal opacity within the right upper and mid lung. While this finding may in part reflect atelectasis, possible postobstructive pneumonia could also be considered. 4. Patchy multifocal ground-glass opacity with interlobular septal thickening throughout the lungs, suspected to reflect pulmonary edema superimposed on underlying emphysema. 5. Left pleural effusion. Critical Value/emergent results were called by telephone at the time of interpretation on 03/06/2016 at 11:05 pm to Dr. Harvest Dark , who verbally acknowledged these results. Electronically Signed   By: Jeannine Boga M.D.   On: 03/08/2016 23:10   Dg Chest Portable 1 View  Result Date: 02/18/2016 CLINICAL DATA:  Shortness of breath and chest pain.  Lung carcinoma. EXAM: PORTABLE CHEST 1 VIEW COMPARISON:  February 06, 2016 FINDINGS: There is new airspace opacity in the right mid lung, concerning for acute pneumonia. There is widespread fibrosis bilaterally, much more severe on the right than on the left. There is asymmetric right apical pleural thickening,, likely due to radiation therapy change. There may be residual mass in this area. Fullness in the right hilum raises concern for adenopathy. Heart size is normal. Pulmonary vascularity is stable with some distortion due to the extensive fibrosis. Port-A-Cath tip is in the left  innominate vein. No pneumothorax. No blastic or lytic bone lesions are evident. IMPRESSION: Suspect pneumonia right mid lung. Extensive fibrosis is noted bilaterally, more on the right than on the left. Suspect radiation therapy change in the right apex. Residual mass in this area may well be present. There is concern for right hilar adenopathy. Stable cardiac silhouette. Port-A-Cath tip in left innominate vein. No pneumothorax. Electronically Signed   By: Lowella Grip III M.D.   On: 02/22/2016 21:54   STUDIES:  CT of Chest 10/18>>Few scattered small filling defects with faint left lower lobe segmental and subsegmental pulmonary arteries as above, consistent with acute pulmonary emboli. No evidence for right heart strain. Persistent right apical lung mass, compatible with known primary bronchogenic carcinoma. Lesion is decreased in size from 09/22/2015, measuring approximately 5.4 x 7.3 x 6.5 cm on today's study. Possible direct mediastinal invasion again noted. Mediastinal and right hilar as above. Increased patchy parenchymal opacity within the right upper and mid lung. While this finding may in part reflect atelectasis, possible postobstructive pneumonia could also be Considered. Patchy multifocal ground-glass opacity with interlobular septal thickening throughout the lungs, suspected to reflect pulmonary edema superimposed on underlying emphysema. Left pleural effusion.  CULTURES: Blood x2 10/18>>  ANTIBIOTICS: Cefepime 10/18>> Vancomycin 10/18>>  SIGNIFICANT EVENTS: 10/18-Pt admitted to [facility] telemetry unit due to acute on chronic hypoxic respiratory failure 10/18-Pt transferred to ICU due to worsening acute on chronic hypoxic respiratory failure requiring bipap PCCM consulted for further management   LINES/TUBES: PIV's x2 10/18>> Left chest portacath>>  ASSESSMENT / PLAN:  PULMONARY A: Acute on chronic hypoxic respiratory failure secondary to HCAP vs Pulmonary Edema, AECOPD, LLL  PE and Malignant Neoplasm of RLL P: Continue bipap for now wean as tolerated Maintain O2 sats 92% or above  Continue Bronchodilators Continue steroids Prn CXR Prn ABG's Heparin gtt dosing per pharmacy  CARDIOVASCULAR A:  Hypertension Elevated troponin's Hx: Atrial Fibrillation and Deep Venous Thrombosis P:  Heparin gtt Trend troponin's Cardiology consulted appreciate input Per Cardiologist Dr. Laurelyn Sickle note on 10/19 the earliest a cardiac catheterization can be performed is on 10/19 due to recent Eliquis use, however pt refusing cardiac cath at this time Continue lasix  RENAL A:   No acute issues P:   Trend BMP's Replace electrolytes as indicated Monitor UOP  GASTROINTESTINAL A:   No acute issues P:   Keep NPO for now   HEMATOLOGIC A:   Anemia-improving Malignant neoplasm RLL with mets to the brain Leukocytosis  P:  Heparin gtt dosing per pharmacy Trend CBC's Monitor for s/sx of bleeding Transfuse for hgb <7  INFECTIOUS A:   HCAP P:   Trend WBC's and monitor fever curve Trend PCT's Continue abx as listed above Follow cultures Flu panel pending   ENDOCRINE A:   Hyperglycemia P:   SSI CBG's q6hrs  NEUROLOGIC A:   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 Vilinda Boehringer have personally reviewed patient's available data, including medical history, events of note, physical examination and test results as part of my evaluation. I have discussed with  NP Blakeney and  other care providers such as pharmacist, RN and RRT.  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 CTA of chest showed a disease had decrease in the size of his right apex lung mass, however it is increasing irregular areas of groundglass opacities along with moderate emphysema. Patient was initially admitted to [facility] on 10/18 with complaints of chest pain and shortness of breath, he recently completed a course of chemotherapy and radiation to his brain, yesterday started having acute shortness of breath not associated with any activity, also noted to have pain down his left side. Given his hypoxia and O2 requiring use transferred urgently to the ICU, further lab workup showed elevated troponin of 12, and findings consistent with ST and T-wave changes in his EKG consistent with MI.  O:  GEN-mod resp distress on Bipap, awake and alert HEENT-PEERLA, [place]/AT, no lesions CVS-tachycardia, S1, s2 LUNGS-coarse upper airway sounds, dec basilar BS with mild crackles, no wheezes ABD-soft, nt, nd, +BS MSK-no lesions   Recent Labs CBC Latest Ref Rng & Units 02/25/2016 02/20/2016 02/14/2016  WBC 3.8 - 10.6 K/uL 51.7(HH) 44.3(H) 33.3(H)  Hemoglobin 13.0 - 18.0 g/dL 13.0 12.8(L) 10.8(L)  Hematocrit 40.0 - 52.0 % 38.8(L) 39.2(L) 31.3(L)  Platelets 150 - 440 K/uL 89(L) 103(L) 218      Recent Labs BMP Latest Ref Rng & Units 02/25/2016 02/25/2016 02/13/2016  Glucose 65 - 99 mg/dL 175(H) 199(H) 168(H)  BUN 6 - 20 mg/dL 40(H) 34(H) 17  Creatinine 0.61 - 1.24 mg/dL 1.09 1.07 1.04  Sodium 135 - 145 mmol/L 134(L) 132(L) 136  Potassium 3.5 - 5.1 mmol/L 4.2 4.0 4.2  Chloride 101 -  111 mmol/L 97(L) 96(L) 102  CO2 22 - 32 mmol/L '24 24 25  '$ Calcium 8.9 - 10.3 mg/dL 8.6(L) 8.8(L) 9.0       (The following images and results were reviewed by Dr. Stevenson Clinch on 02/25/2016). Ct Angio Chest Pe W And/or Wo Contrast  Result Date: 02/18/2016 CLINICAL DATA:  Initial evaluation for acute shortness of breath. History of lung carcinoma. EXAM: CT ANGIOGRAPHY CHEST WITH CONTRAST TECHNIQUE: Multidetector CT imaging of the chest was performed using the standard protocol during bolus administration of intravenous contrast. Multiplanar CT image reconstructions and MIPs were obtained to evaluate the vascular anatomy. CONTRAST:  75 cc of Isovue 370. COMPARISON:  Prior MRI from 02/13/2016 as well as prior CT 09/22/2015. Comparison also made with prior radiograph from earlier the same day. FINDINGS: Cardiovascular: Intrathoracic aorta of normal caliber without acute abnormality. Scattered atheromatous plaque within the intrathoracic aorta. Visualized great vessels demonstrate no acute abnormality. Mild cardiomegaly with left atrial enlargement. Three vessel coronary artery calcifications. The  no pericardial effusion. Pulmonary arteries adequately opacified for evaluation. Main pulmonary artery measures within normal limits for size at 2.8 cm in diameter. Few small linear filling defects are present within the left lower lobe segmental arteries (series 5, image 175, 200, 223), consistent with small acute pulmonary emboli. No other definite focal filling defects are identified elsewhere within the lungs. No findings to suggest right heart strain. RV LV ratio within normal limits. Mediastinum/Nodes: Thyroid within normal limits. Right peritracheal lymph nodes measure up to 14 mm (series 4, images 34). Sub carinal nodal conglomerate measures 2.2 cm. Right hilar soft tissue fullness/nodal mass measures 3.9 x 2.2 cm. No left hilar adenopathy. Shotty sub cm prevascular and left paratracheal nodes noted. Mildly  prominent right cardiophrenic nodes measure up to 9 mm (series 4, image 81). Esophagus within normal limits. No axillary adenopathy. Lungs/Pleura: Irregular mass like opacity at the right lung apex again seen, measuring approximately 5.4 x 7.2 x 6.5 cm on today's study. Multiple right upper lobe segmental artery seen coursing through the mass, which are somewhat attenuated and narrowed. Mass approximates the right upper margin of the mediastinum, and may directly invade the mediastinum. Previously noted direct wall invasion better evaluated on recent MRI. Sub cm right supraclavicular lymph node noted. Irregular right pleural effusion is present. Extensive multifocal areas of ground-glass with interlobular septal thickening present throughout the lungs bilaterally (crazy paving), suspected to reflect pulmonary edema, superimposed on underlying emphysema. More confluent irregular areas of opacity within the right upper and mid lung may reflect atelectasis and/or areas of infection/infiltrate. Superimposed lymphangitic spread of tumor may be present as well. 19 mm pleural-based nodule noted at the mid right lung (series 6, image 64). Upper Abdomen: 11 mm hypodensity within the medial right hepatic lobe noted, stable, indeterminate. Remainder the visualized upper abdomen unremarkable. Musculoskeletal: No acute osseous abnormality. No worrisome lytic or blastic osseous lesions. Review of the MIP images confirms the above findings. IMPRESSION: 1. Few scattered small filling defects with faint left lower lobe segmental and subsegmental pulmonary arteries as above, consistent with acute pulmonary emboli. No evidence for right heart strain. 2. Persistent right apical lung mass, compatible with known primary bronchogenic carcinoma. Lesion is decreased in size from 09/22/2015, measuring approximately 5.4 x 7.3 x 6.5 cm on today's study. Possible direct mediastinal invasion again noted. Mediastinal and right hilar as above. 3.  Increased patchy parenchymal opacity within the right upper and mid lung. While this finding may in part reflect atelectasis, possible postobstructive pneumonia could also be considered. 4. Patchy multifocal ground-glass opacity with interlobular septal thickening throughout the lungs, suspected to reflect pulmonary edema superimposed on underlying emphysema. 5. Left pleural effusion. Critical Value/emergent results were called by telephone at the time of interpretation on 02/09/2016 at 11:05 pm to Dr. Harvest Dark , who verbally acknowledged these results. Electronically Signed   By: Jeannine Boga M.D.   On: 02/08/2016 23:10   Dg Chest Portable 1 View  Result Date: 02/23/2016 CLINICAL DATA:  Shortness of breath and chest pain.  Lung carcinoma. EXAM: PORTABLE CHEST 1 VIEW COMPARISON:  February 06, 2016 FINDINGS: There is new airspace opacity in the right mid lung, concerning for acute pneumonia. There is widespread fibrosis bilaterally, much more severe on the right than on the left. There is asymmetric right apical pleural thickening,, likely due to radiation therapy change. There may be residual mass in this area. Fullness in the right hilum raises concern for adenopathy. Heart size is normal. Pulmonary vascularity is stable  with some distortion due to the extensive fibrosis. Port-A-Cath tip is in the left innominate vein. No pneumothorax. No blastic or lytic bone lesions are evident. IMPRESSION: Suspect pneumonia right mid lung. Extensive fibrosis is noted bilaterally, more on the right than on the left. Suspect radiation therapy change in the right apex. Residual mass in this area may well be present. There is concern for right hilar adenopathy. Stable cardiac silhouette. Port-A-Cath tip in left innominate vein. No pneumothorax. Electronically Signed   By: Lowella Grip III M.D.   On: 03/04/2016 21:20      A: 65 year old male initially admitted for hypoxia and chest pain not thought to  have ST and T wave changes consistent with MI, not a candidate for cardiac catheterization given being on Xarelto and patient refusal. 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 His chest CT also showed small subsegmental pulmonary embolus  ST-T wave changes, MI Chest pain Hypoxia Lung cancer-stage IIIB, right apex Small subsegmental pulmonary emboli DVT on Xarelto and IVC filter Hypertension Elevated troponin Atrial fibrillation Lung cancer with metastasis to the brain COPD HCAP  P:   -Patient most likely had an MI given his EKG changes and his elevated troponin, he is not a candidate for cardiac catheterization given his current anticoagulation status and patient refusal at this time. 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  Rest per NP/medical resident whose note is outlined above and that I agree with  The patient is critically ill with multiple organ systems failure and requires high complexity decision making for assessment and support, frequent evaluation and titration of therapies, application of advanced monitoring technologies and extensive interpretation of multiple databases.   Critical Care Time devoted to patient care services described in this note is  45 Minutes.   This time reflects time of  care of this signee Dr Vilinda Boehringer.  This critical care time does not reflect procedure time, or teaching time or supervisory time of PA/NP/Med-student/Med Resident etc but could involve care discussion time.  Vilinda Boehringer, MD [facility] Pager [phone] (please enter 7-digits) On Call Pager [phone] (please enter 7-digits)  Note: This note was prepared with Dragon dictation along with smaller phrase technology. Any transcriptional errors that result from this process are unintentional.

## 2016-02-25 NOTE — Progress Notes (Signed)
ANTICOAGULATION CONSULT NOTE - Initial Consult  Pharmacy Consult for heparin drip Indication: ACS/STEMI/NSTEMI  Allergies  Allergen Reactions  . Aleve [Naproxen Sodium] Hives, Shortness Of Breath and Swelling    Naproxin    Patient Measurements: Height: '6\' 2"'$  (188 cm) Weight: 166 lb 3.6 oz (75.4 kg) IBW/kg (Calculated) : 82.2 Heparin Dosing Weight: 75kg  Vital Signs: Temp: 98.2 F (36.8 C) (10/19 0500) Temp Source: Oral (10/19 0500) BP: 137/95 (10/19 0600) Pulse Rate: 105 (10/19 0600)  Labs:  Recent Labs  02/11/2016 2054 02/25/16 0413 02/25/16 0644  HGB 12.8*  --   --   HCT 39.2*  --   --   PLT 103*  --   --   APTT  --   --  24  LABPROT  --   --  16.9*  INR  --   --  1.36  HEPARINUNFRC  --   --  0.82*  CREATININE 1.07  --   --   TROPONINI 0.52* 4.15*  --     Estimated Creatinine Clearance: 74.4 mL/min (by C-G formula based on SCr of 1.07 mg/dL).   Medical History: Past Medical History:  Diagnosis Date  . Arthritis   . Atrial fibrillation ([place])   . Cancer ([place]) 2017   Lung  . CHF (congestive heart failure) ([place])   . Chronic kidney disease   . COPD (chronic obstructive pulmonary disease) ([place])   . Disease of lung   . DVT (deep venous thrombosis) ([facility])    left leg  . Dysrhythmia   . Headache   . History of chemotherapy   . History of radiation therapy   . Hypertension   . Pneumonia     Medications:  On apixaban as outpatient.  Assessment:  Goal of Therapy:  Heparin level 0.3-0.7 units/ml Monitor platelets by anticoagulation protocol: Yes   Plan:  Patient has not received apixaban since admission last night so will go ahead and bolus 4000 units with initial rate of 900 units/hr. First aPTT 6 hours after start of infusion.  Jilene Spohr S 02/25/2016,7:14 AM

## 2016-02-25 NOTE — Progress Notes (Signed)
Pharmacy Antibiotic Note  Bayden Gil is a 65 y.o. male admitted on 03/04/2016 with pneumonia.  Pharmacy has been consulted for vancomycin, cefepime, and Levaquin dosing.  Plan: DW 75.4kg  Vd 53L kei 0.066 hr-1  T1/2 10.5 hours Vancomycin 1 gram q 12 hours ordered with stacked dosing. Level before 5th dose. Goal trough 15-20.  Cefepime 2 grams q 8 hours ordered.  Levaquin 750 mg q 24 hours ordered.  Height: '6\' 2"'$  (188 cm) Weight: 166 lb 3.6 oz (75.4 kg) IBW/kg (Calculated) : 82.2  Temp (24hrs), Avg:97.6 F (36.4 C), Min:97.5 F (36.4 C), Max:97.7 F (36.5 C)   Recent Labs Lab 02/11/2016 2054  WBC 44.3*  CREATININE 1.07    Estimated Creatinine Clearance: 74.4 mL/min (by C-G formula based on SCr of 1.07 mg/dL).    Allergies  Allergen Reactions  . Aleve [Naproxen Sodium] Hives, Shortness Of Breath and Swelling    Naproxin    Antimicrobials this admission: vancomycin  >>  cefepime Levaquin >>   Dose adjustments this admission:   Microbiology results: 10/18 BCx: pending 10/19 MRSA PCR: pending    10/18 CXR: suspected R pneumonia  Thank you for allowing pharmacy to be a part of this patient's care.  Cambre Matson S 02/25/2016 2:56 AM

## 2016-02-25 NOTE — Progress Notes (Signed)
[facility] [place] NAME: Sean Hall    MR#:  [id number]  DATE OF BIRTH:  10/21/1950  SUBJECTIVE:  CHIEF COMPLAINT:   Chief Complaint  Patient presents with  . Chest Pain  . Shortness of Breath   ADMITTED FOR  Worsening shortness of breath. ON Bipap.  REVIEW OF SYSTEMS:    Review of Systems  Constitutional: Positive for malaise/fatigue. Negative for chills and fever.  HENT: Negative for sore throat.   Eyes: Negative for blurred vision, double vision and pain.  Respiratory: Positive for cough, sputum production, shortness of breath and wheezing. Negative for hemoptysis.   Cardiovascular: Positive for chest pain. Negative for palpitations, orthopnea and leg swelling.  Gastrointestinal: Negative for abdominal pain, constipation, diarrhea, heartburn, nausea and vomiting.  Genitourinary: Negative for dysuria and hematuria.  Musculoskeletal: Negative for back pain and joint pain.  Skin: Negative for rash.  Neurological: Positive for weakness. Negative for sensory change, speech change, focal weakness and headaches.  Endo/Heme/Allergies: Does not bruise/bleed easily.  Psychiatric/Behavioral: Negative for depression. The patient is not nervous/anxious.     DRUG ALLERGIES:   Allergies  Allergen Reactions  . Aleve [Naproxen Sodium] Hives, Shortness Of Breath and Swelling    Naproxin    VITALS:  Blood pressure (!) 157/113, pulse (!) 113, temperature 98.2 F (36.8 C), temperature source Oral, resp. rate (!) 24, height '6\' 2"'$  (1.88 m), weight 75.4 kg (166 lb 3.6 oz), SpO2 97 %.  PHYSICAL EXAMINATION:   Physical Exam  GENERAL:  65 y.o.-year-old patient lying in the bed looks critically ill. Bipap EYES: Pupils equal, round, reactive to light and accommodation. No scleral icterus. Extraocular muscles intact.  HEENT: Head atraumatic, normocephalic. Oropharynx and nasopharynx clear.  NECK:  Supple, no jugular venous distention. No  thyroid enlargement, no tenderness.  LUNGS: Increased work breathing. Bilateral crackles and wheezing CARDIOVASCULAR: S1, S2 normal. No murmurs, rubs, or gallops.  ABDOMEN: Soft, nontender, nondistended. Bowel sounds present. No organomegaly or mass.  EXTREMITIES: No cyanosis, clubbing or edema b/l.    NEUROLOGIC: Cranial nerves II through XII are intact. No focal Motor or sensory deficits b/l.   PSYCHIATRIC: The patient is alert and oriented x 3.  SKIN: No obvious rash, lesion, or ulcer.   LABORATORY PANEL:   CBC  Recent Labs Lab 03/05/2016 2054  WBC 44.3*  HGB 12.8*  HCT 39.2*  PLT 103*   ------------------------------------------------------------------------------------------------------------------ Chemistries   Recent Labs Lab 02/16/2016 2054  NA 132*  K 4.0  CL 96*  CO2 24  GLUCOSE 199*  BUN 34*  CREATININE 1.07  CALCIUM 8.8*  AST 51*  ALT 51  ALKPHOS 90  BILITOT 0.8   ------------------------------------------------------------------------------------------------------------------  Cardiac Enzymes  Recent Labs Lab 02/25/16 0413  TROPONINI 4.15*   ------------------------------------------------------------------------------------------------------------------  RADIOLOGY:  Ct Angio Chest Pe W And/or Wo Contrast  Result Date: 02/14/2016 CLINICAL DATA:  Initial evaluation for acute shortness of breath. History of lung carcinoma. EXAM: CT ANGIOGRAPHY CHEST WITH CONTRAST TECHNIQUE: Multidetector CT imaging of the chest was performed using the standard protocol during bolus administration of intravenous contrast. Multiplanar CT image reconstructions and MIPs were obtained to evaluate the vascular anatomy. CONTRAST:  75 cc of Isovue 370. COMPARISON:  Prior MRI from 02/13/2016 as well as prior CT 09/22/2015. Comparison also made with prior radiograph from earlier the same day. FINDINGS: Cardiovascular: Intrathoracic aorta of normal caliber without acute abnormality.  Scattered atheromatous plaque within the intrathoracic aorta. Visualized great vessels demonstrate no acute  abnormality. Mild cardiomegaly with left atrial enlargement. Three vessel coronary artery calcifications. The no pericardial effusion. Pulmonary arteries adequately opacified for evaluation. Main pulmonary artery measures within normal limits for size at 2.8 cm in diameter. Few small linear filling defects are present within the left lower lobe segmental arteries (series 5, image 175, 200, 223), consistent with small acute pulmonary emboli. No other definite focal filling defects are identified elsewhere within the lungs. No findings to suggest right heart strain. RV LV ratio within normal limits. Mediastinum/Nodes: Thyroid within normal limits. Right peritracheal lymph nodes measure up to 14 mm (series 4, images 34). Sub carinal nodal conglomerate measures 2.2 cm. Right hilar soft tissue fullness/nodal mass measures 3.9 x 2.2 cm. No left hilar adenopathy. Shotty sub cm prevascular and left paratracheal nodes noted. Mildly prominent right cardiophrenic nodes measure up to 9 mm (series 4, image 81). Esophagus within normal limits. No axillary adenopathy. Lungs/Pleura: Irregular mass like opacity at the right lung apex again seen, measuring approximately 5.4 x 7.2 x 6.5 cm on today's study. Multiple right upper lobe segmental artery seen coursing through the mass, which are somewhat attenuated and narrowed. Mass approximates the right upper margin of the mediastinum, and may directly invade the mediastinum. Previously noted direct wall invasion better evaluated on recent MRI. Sub cm right supraclavicular lymph node noted. Irregular right pleural effusion is present. Extensive multifocal areas of ground-glass with interlobular septal thickening present throughout the lungs bilaterally (crazy paving), suspected to reflect pulmonary edema, superimposed on underlying emphysema. More confluent irregular areas of  opacity within the right upper and mid lung may reflect atelectasis and/or areas of infection/infiltrate. Superimposed lymphangitic spread of tumor may be present as well. 19 mm pleural-based nodule noted at the mid right lung (series 6, image 64). Upper Abdomen: 11 mm hypodensity within the medial right hepatic lobe noted, stable, indeterminate. Remainder the visualized upper abdomen unremarkable. Musculoskeletal: No acute osseous abnormality. No worrisome lytic or blastic osseous lesions. Review of the MIP images confirms the above findings. IMPRESSION: 1. Few scattered small filling defects with faint left lower lobe segmental and subsegmental pulmonary arteries as above, consistent with acute pulmonary emboli. No evidence for right heart strain. 2. Persistent right apical lung mass, compatible with known primary bronchogenic carcinoma. Lesion is decreased in size from 09/22/2015, measuring approximately 5.4 x 7.3 x 6.5 cm on today's study. Possible direct mediastinal invasion again noted. Mediastinal and right hilar as above. 3. Increased patchy parenchymal opacity within the right upper and mid lung. While this finding may in part reflect atelectasis, possible postobstructive pneumonia could also be considered. 4. Patchy multifocal ground-glass opacity with interlobular septal thickening throughout the lungs, suspected to reflect pulmonary edema superimposed on underlying emphysema. 5. Left pleural effusion. Critical Value/emergent results were called by telephone at the time of interpretation on 02/25/2016 at 11:05 pm to Dr. Harvest Dark , who verbally acknowledged these results. Electronically Signed   By: Jeannine Boga M.D.   On: 02/20/2016 23:10   Dg Chest Portable 1 View  Result Date: 03/05/2016 CLINICAL DATA:  Shortness of breath and chest pain.  Lung carcinoma. EXAM: PORTABLE CHEST 1 VIEW COMPARISON:  February 06, 2016 FINDINGS: There is new airspace opacity in the right mid lung,  concerning for acute pneumonia. There is widespread fibrosis bilaterally, much more severe on the right than on the left. There is asymmetric right apical pleural thickening,, likely due to radiation therapy change. There may be residual mass in this area. Fullness in the right  hilum raises concern for adenopathy. Heart size is normal. Pulmonary vascularity is stable with some distortion due to the extensive fibrosis. Port-A-Cath tip is in the left innominate vein. No pneumothorax. No blastic or lytic bone lesions are evident. IMPRESSION: Suspect pneumonia right mid lung. Extensive fibrosis is noted bilaterally, more on the right than on the left. Suspect radiation therapy change in the right apex. Residual mass in this area may well be present. There is concern for right hilar adenopathy. Stable cardiac silhouette. Port-A-Cath tip in left innominate vein. No pneumothorax. Electronically Signed   By: Lowella Grip III M.D.   On: 02/29/2016 21:54     ASSESSMENT AND PLAN:   * healthcare acquired pneumonia with acute on chronic respiratory failure and sepsis Continue IV antibiotics. Discontinue vancomycin. MRSA PCR negative Continue BiPAP support. Patient is critically ill. DO NOT resuscitate. Wean to nasal cannula as tolerated Consult pulmonary  * left lower lobe pulmonary embolism Xarelto held. Started on heparin drip. As history of  DVT with IVC filter in place  * acute on chronic systolic CHF with ejection fraction of 35-40% - IV Lasix, Beta blockers - Input and Output - Monitor Bun/Cr and Potassium - Echo -Cardiology follow up after discharge  * Elevated troponin likely demand ischemia due to pulmonary embolism, pneumonia, CHF Discussed  With Dr. 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 Management plans discussed with the patient, family and they are in agreement.  CODE STATUS: DNR  DVT Prophylaxis: SCDs  TOTAL CC TIME TAKING CARE OF THIS PATIENT: 35 minutes.   Poor prognosis With multiple co morbidities. DO NOT RESUSCITATE.  POSSIBLE D/C IN 3-4 DAYS, DEPENDING ON CLINICAL CONDITION.  Hillary Bow R M.D on 02/25/2016 at 9:32 AM  Between 7am to 6pm - Pager - [phone]  After 6pm go to [email or web address] - password [email or web address] [facility]  Office  [phone]  CC: Primary care physician; Lloyd Huger, MD  Note: This dictation was prepared with Dragon dictation along with smaller phrase technology. Any transcriptional errors that result from this process are unintentional.

## 2016-02-25 NOTE — Progress Notes (Signed)
Patient has climbing troponin levels with last one noted to be 19. Patient is DNR, and hospice has seen him as has cancer of lungs with mets to brain, and is alert and oriented. He was explained by me alone and with presence of his nurse in ICU and has refused to have cardiac cath. He was explained he is having acute MI.

## 2016-02-25 NOTE — Progress Notes (Signed)
Notified Dr Neoma Laming of patient's positive troponin that is higher than last troponin drawn.  No new orders at this time; Dr Humphrey Rolls said not to call with any more troponin results.  Patient recently had xarelto and cannot receive cardiac cath at this time.

## 2016-02-25 NOTE — Progress Notes (Addendum)
ANTICOAGULATION CONSULT NOTE - Initial Consult  Pharmacy Consult for heparin drip Indication: ACS/STEMI/NSTEMI  Allergies  Allergen Reactions  . Aleve [Naproxen Sodium] Hives, Shortness Of Breath and Swelling    Naproxin    Patient Measurements: Height: '6\' 2"'$  (188 cm) Weight: 166 lb 3.6 oz (75.4 kg) IBW/kg (Calculated) : 82.2 Heparin Dosing Weight: 75kg  Vital Signs: Temp: 97.7 F (36.5 C) (10/19 1900) Temp Source: Oral (10/19 1900) BP: 131/92 (10/19 2000) Pulse Rate: 112 (10/19 2000)  Labs:  Recent Labs  02/23/2016 2054 02/25/16 0413 02/25/16 0644 02/25/16 1035 02/25/16 1433 02/25/16 2043  HGB 12.8*  --   --  13.0  --   --   HCT 39.2*  --   --  38.8*  --   --   PLT 103*  --   --  89*  --   --   APTT  --   --  24  --  66* 62*  LABPROT  --   --  16.9*  --   --   --   INR  --   --  1.36  --   --   --   HEPARINUNFRC  --   --  0.82*  --   --  1.02*  CREATININE 1.07  --   --  1.09  --   --   TROPONINI 0.52* 4.15*  --  12.34* 19.65*  --     Estimated Creatinine Clearance: 73 mL/min (by C-G formula based on SCr of 1.09 mg/dL).   Medical History: Past Medical History:  Diagnosis Date  . Arthritis   . Atrial fibrillation ([place])   . Cancer ([place]) 2017   Lung  . CHF (congestive heart failure) ([place])   . Chronic kidney disease   . COPD (chronic obstructive pulmonary disease) ([place])   . Disease of lung   . DVT (deep venous thrombosis) ([facility])    left leg  . Dysrhythmia   . Headache   . History of chemotherapy   . History of radiation therapy   . Hypertension   . Pneumonia     Medications:  Pharmacy consulted to dose and monitor heparin drip in this 65 year old male admitted with ACS/NSTEMI.  On apixaban as outpatient so will need to dose off of aPTT until aPTT and HL coincide.  Assessment:  Goal of Therapy:  Heparin level 0.3-0.7 units/ml  APTT 66 - 102 seconds Monitor platelets by anticoagulation protocol: Yes   Plan:  10/19 aPTT @ 1433 = 66 seconds, which  is therapeutic. Will continue heparin drip at current rate of 900 units/hr and check confirmatory aPTT in 6 hours.   10/19 @ 20:30 :   APTT = 62 seconds, HL = 1.02 Will increase heparin gtt rate to 1000 units/hr and recheck aPTT 6 hrs after rate change.  Will recheck HL on 10/20 with AM labs.   CBC to be checked daily  Anmarie Fukushima D, PharmD Clinical Pharmacist 02/25/2016,9:58 PM

## 2016-02-25 NOTE — Progress Notes (Signed)
Initial Nutrition Assessment  DOCUMENTATION CODES:   Not applicable  INTERVENTION:  -Ensure Enlive po TID, each supplement provides 350 kcal and 20 grams of protein when patient is off of BiPAP -Snacks as desired  NUTRITION DIAGNOSIS:   Increased nutrient needs related to cancer and cancer related treatments, chronic illness as evidenced by estimated needs.  GOAL:   Patient will meet greater than or equal to 90% of their needs  MONITOR:   PO intake, I & O's, Labs, Weight trends, Supplement acceptance  REASON FOR ASSESSMENT:   Malnutrition Screening Tool    ASSESSMENT:   Sean Hall  is a 65 y.o. male who presents with Chest pain and shortness of breath today. This was acute in onset, not associated with activity or any other exacerbating or alleviating factors. This pain was all across his chest but did move down to his left arm as well. Admitted with diagnosis of HCAP, PE, Sepsis. Hx of Lung CA w/ Mets.  Spoke with Sean Hall, Wife at bedside. She did most of the talking for him seeing as he is currently on BiPAP.  Admits to good PO intake at home, eating 4-5 meals a day. She states patient did have wt loss with initial onset of cancer, but since, his weight has been stable for at least the past 3 months with an overall 10# wt gain since 11/2015 Nutrition-Focused physical exam completed. Findings are no fat depletion, mild-moderte muscle depletion, and no edema. Patient consumes ensure at home regularly, would like to have during his stay but with risk of aspiration on BiPAP patient cannot consume food/ONS. Denies chewing/swallowing/choking concerns Denies nausea/vomiting Chronic steroids Very hungry. Labs and Medications reviewed: CBGs acceptable Na 134 Solumedrol  Diet Order:  Diet Heart Room service appropriate? Yes; Fluid consistency: Thin  Skin:  Reviewed, no issues  Last BM:  PTA  Height:   Ht Readings from Last 1 Encounters:  02/25/16 '6\' 2"'$  (1.88 m)     Weight:   Wt Readings from Last 1 Encounters:  02/25/16 166 lb 3.6 oz (75.4 kg)    Ideal Body Weight:  86.36 kg  BMI:  Body mass index is 21.34 kg/m.  Estimated Nutritional Needs:   Kcal:  2200-2450 calories  Protein:  89-112 gm  Fluid:  >/= 2.2L  EDUCATION NEEDS:   No education needs identified at this time  Satira Anis. Ruston Fedora, MS, RD LDN Inpatient Clinical Dietitian Pager [phone]

## 2016-02-25 NOTE — Progress Notes (Signed)
*  PRELIMINARY RESULTS* Echocardiogram 2D Echocardiogram has been performed.  Sherrie Sport 02/25/2016, 2:53 PM

## 2016-02-25 NOTE — ED Notes (Signed)
Pt improving with 100% O2 with Bipap. Will continue to monitor.

## 2016-02-25 NOTE — Progress Notes (Signed)
RN notified Dr Jonette Mate of patient BP still high 151/99, HR 130 sometimes but mostly in the 110, patient currently receiving nitro bid.  MD states to let Dr Humphrey Rolls know if bp over 160, no new order at this time.

## 2016-02-25 NOTE — Consult Note (Signed)
Consultation Note Date: 02/25/2016   Patient Name: Sean Hall  DOB: 10-Oct-1950  MRN: [id number]  Age / Sex: 65 y.o., male  PCP: Sean Huger, MD Referring Physician: Hillary Bow, MD  Reason for Consultation: Establishing goals of care and Psychosocial/spiritual support  HPI/Patient Profile: 65 y.o. male  admitted on 03/03/2016 a known history of Lung cancer, hypertension presents to the emergency room due to worsening weakness of his right upper extremity. Admitted  Currently in ICU, on BiPap -healthcare acquired pneumonia with acute on chronic respiratory failure and sepsis -left lower lobe pulmonary embolism -acute on chronic systolic CHF with ejection fraction of 35-40% - elevated troponin likely demand ischemia due to pulmonary embolism, pneumonia, CHF        - Cardiology consulted, Consider cardiac catheterization Patient had an MRI of his cervical spine yesterday which showed extension of his lung cancer paravertebrally but no impingement of nerves.    -CT scan of the head which shows new metastases bilaterally with surrounding edema.  Patient is faced with treatment option decisions, AD decisions  and anticipatory care needs.   Clinical Assessment and Goals of Care:  This NP Sean Hall reviewed medical records, received report from team, assessed the patient and then meet at the patient's bedside   to discuss diagnosis, prognosis, GOC, EOL wishes disposition and options.  I meet later with his SO/ Sean Hall and her sister to offer emotional support and answer questions and address concerns  A detailed discussion was had today regarding advanced directives.  Concepts specific to code status, artifical feeding and hydration, continued IV antibiotics and rehospitalization was had.  The difference between a aggressive medical intervention path  and a palliative comfort care path for  this patient at this time was had.  Values and goals of care important to patient and family were attempted to be elicited.  Concept of Hospice and Palliative Care were discussed    Questions and concerns addressed.   Family encouraged to call with questions or concerns.  PMT will continue to support holistically.  Patient is aware I will f/u on Monday   SUMMARY OF RECOMMENDATIONS    Code Status/Advance Care Planning:  DNR  Symptom Management:   Dyspnea: Recommend Mophine 2 mg IV every 4 hrs prn  Palliative Prophylaxis:   Aspiration, Bowel Regimen, Delirium Protocol, Frequent Pain Assessment and Oral Care  Additional Recommendations (Limitations, Scope, Preferences):  Patient at this time verbalizes that he has not "totally made up my mind" regarding offered medical intervention and life prolonging measures.  He tells me he has 24 hours to make decision on cardiac interventions if he is even eligible.  He understands his overall poor prognosis.  His main worry is his SO Sean Hall, "she is not handling this too well"    He is hesitant to talk with her about "how sick he really is"   Psycho-social/Spiritual:   Desire for further Chaplaincy support:no  Additional Recommendations: Education on Hospice  Prognosis:   < 3 months  Discharge Planning:  To Be Determined      Primary Diagnoses: Present on Admission: . Chronic obstructive pulmonary disease ([place]) . BP (high blood pressure) . Malignant neoplasm of right upper lobe of lung ([place]) . HCAP (healthcare-associated pneumonia) . Pulmonary embolism ([place]) . Sepsis ([place])   I have reviewed the medical record, interviewed the patient and family, and examined the patient. The following aspects are pertinent.  Past Medical History:  Diagnosis Date  . Arthritis   . Atrial fibrillation ([place])   . Cancer ([place]) 2017   Lung  . CHF (congestive heart failure) ([place])   . Chronic kidney disease   . COPD (chronic obstructive  pulmonary disease) ([place])   . Disease of lung   . DVT (deep venous thrombosis) ([facility])    left leg  . Dysrhythmia   . Headache   . History of chemotherapy   . History of radiation therapy   . Hypertension   . Pneumonia    Social History   Social History  . Marital status: Single    Spouse name: N/A  . Number of children: N/A  . Years of education: N/A   Social History Main Topics  . Smoking status: Former Smoker    Packs/day: 2.00    Years: 50.00    Types: Cigarettes    Quit date: 08/07/2013  . Smokeless tobacco: Never Used  . Alcohol use No     Comment: rarely  . Drug use: No  . Sexual activity: Yes   Other Topics Concern  . None   Social History Narrative  . None   Family History  Problem Relation Age of Onset  . Diabetes Father   . Hypertension Father    Scheduled Meds: . ceFEPime (MAXIPIME) IV  2 g Intravenous Q8H  . fentaNYL  12.5 mcg Transdermal Q72H  . furosemide  40 mg Intravenous BID  . insulin aspart  0-5 Units Subcutaneous QHS  . insulin aspart  0-9 Units Subcutaneous Q6H  . levofloxacin (LEVAQUIN) IV  750 mg Intravenous Q24H  . methylPREDNISolone (SOLU-MEDROL) injection  60 mg Intravenous Q6H  . nitroGLYCERIN  0.5 inch Topical Q6H  . sodium chloride flush  3 mL Intravenous Q12H   Continuous Infusions: . heparin 900 Units/hr (02/25/16 0818)   PRN Meds:.acetaminophen **OR** acetaminophen, ipratropium-albuterol, ondansetron **OR** ondansetron (ZOFRAN) IV, oxyCODONE, prochlorperazine Medications Prior to Admission:  Prior to Admission medications   Medication Sig Start Date End Date Taking? Authorizing Provider  albuterol (PROAIR HFA) 108 (90 Base) MCG/ACT inhaler Inhale 1-2 puffs into the lungs every 4 (four) hours as needed. 11/18/15  Yes Sean Huger, MD  apixaban (ELIQUIS) 5 MG TABS tablet Take 1 tablet (5 mg total) by mouth 2 (two) times daily. 11/30/15  Yes Sean Huger, MD  dexamethasone (DECADRON) 4 MG tablet Take 1 tablet (4 mg  total) by mouth 3 (three) times daily. 02/15/16  Yes Henreitta Leber, MD  fentaNYL (DURAGESIC - DOSED MCG/HR) 12 MCG/HR Place 1 patch (12.5 mcg total) onto the skin every 3 (three) days. 02/16/16  Yes Henreitta Leber, MD  lidocaine-prilocaine (EMLA) cream Apply to affected area once 10/28/15  Yes Sean Huger, MD  Multiple Vitamin (MULTI-VITAMINS) TABS Take 1 tablet by mouth daily as needed.    Yes Historical Provider, MD  ondansetron (ZOFRAN) 8 MG tablet Take 1 tablet (8 mg total) by mouth 2 (two) times daily as needed for refractory nausea / vomiting. Start on day 3 after chemo. 12/09/15  Yes Sean Huger,  MD  prochlorperazine (COMPAZINE) 10 MG tablet Take 1 tablet (10 mg total) by mouth every 6 (six) hours as needed (Nausea or vomiting). 10/20/15  Yes Sean Huger, MD   Allergies  Allergen Reactions  . Aleve [Naproxen Sodium] Hives, Shortness Of Breath and Swelling    Naproxin   Review of Systems  Constitutional: Positive for activity change and fatigue.  Respiratory: Positive for shortness of breath.   Neurological: Positive for weakness.    Physical Exam  Constitutional: He appears ill.  generalized weakness, currently on BiPap  Cardiovascular: Tachycardia present.   Pulmonary/Chest: Tachypnea noted. He has decreased breath sounds.  Neurological: He is alert.  Skin: Skin is warm and dry.    Vital Signs: BP (!) 149/105   Pulse (!) 122   Temp 98.2 F (36.8 C) (Oral)   Resp (!) 31   Ht '6\' 2"'$  (1.88 m)   Wt 75.4 kg (166 lb 3.6 oz)   SpO2 95%   BMI 21.34 kg/m  Pain Assessment: 0-10 POSS *See Group Information*: S-Acceptable,Sleep, easy to arouse Pain Score: Asleep   SpO2: SpO2: 95 % O2 Device:SpO2: 95 % O2 Flow Rate: .O2 Flow Rate (L/min): 90 L/min  IO: Intake/output summary:  Intake/Output Summary (Last 24 hours) at 02/25/16 1232 Last data filed at 02/25/16 1000  Gross per 24 hour  Intake              253 ml  Output             2355 ml  Net             -2102 ml    LBM:   Baseline Weight: Weight: 73.5 kg (162 lb) Most recent weight: Weight: 75.4 kg (166 lb 3.6 oz)      Palliative Assessment/Data: 30 % at best   Discussed with Dr Darvin Neighbours  Time [id number] Time Out: 1300 Time Total: 75 min Greater than 50%  of this time was spent counseling and coordinating care related to the above assessment and plan.  Signed by: Sean Lessen, NP   Please contact Palliative Medicine Team phone at [phone] for questions and concerns.  For individual provider: See Shea Evans

## 2016-02-26 ENCOUNTER — Ambulatory Visit: Payer: Medicaid Other

## 2016-02-26 ENCOUNTER — Inpatient Hospital Stay: Payer: Medicaid Other

## 2016-02-26 LAB — CBC WITH DIFFERENTIAL/PLATELET
BASOS ABS: 0 10*3/uL (ref 0–0.1)
Basophils Relative: 0 %
EOS ABS: 0 10*3/uL (ref 0–0.7)
Eosinophils Relative: 0 %
HCT: 36 % — ABNORMAL LOW (ref 40.0–52.0)
HEMOGLOBIN: 11.9 g/dL — AB (ref 13.0–18.0)
LYMPHS PCT: 1 %
Lymphs Abs: 0.4 10*3/uL — ABNORMAL LOW (ref 1.0–3.6)
MCH: 28.8 pg (ref 26.0–34.0)
MCHC: 33.1 g/dL (ref 32.0–36.0)
MCV: 87.2 fL (ref 80.0–100.0)
MONO ABS: 1.3 10*3/uL — AB (ref 0.2–1.0)
Monocytes Relative: 3 %
NEUTROS PCT: 96 %
Neutro Abs: 40.7 10*3/uL — ABNORMAL HIGH (ref 1.4–6.5)
PLATELETS: 98 10*3/uL — AB (ref 150–440)
RBC: 4.13 MIL/uL — AB (ref 4.40–5.90)
RDW: 23.1 % — ABNORMAL HIGH (ref 11.5–14.5)
WBC: 42.4 10*3/uL — AB (ref 3.8–10.6)

## 2016-02-26 LAB — BASIC METABOLIC PANEL
ANION GAP: 11 (ref 5–15)
BUN: 49 mg/dL — AB (ref 6–20)
CHLORIDE: 95 mmol/L — AB (ref 101–111)
CO2: 27 mmol/L (ref 22–32)
Calcium: 8.4 mg/dL — ABNORMAL LOW (ref 8.9–10.3)
Creatinine, Ser: 1.16 mg/dL (ref 0.61–1.24)
Glucose, Bld: 172 mg/dL — ABNORMAL HIGH (ref 65–99)
POTASSIUM: 4.1 mmol/L (ref 3.5–5.1)
SODIUM: 133 mmol/L — AB (ref 135–145)

## 2016-02-26 LAB — BLOOD CULTURE ID PANEL (REFLEXED)
ACINETOBACTER BAUMANNII: NOT DETECTED
CANDIDA ALBICANS: NOT DETECTED
CANDIDA KRUSEI: NOT DETECTED
Candida glabrata: NOT DETECTED
Candida parapsilosis: NOT DETECTED
Candida tropicalis: NOT DETECTED
ENTEROBACTER CLOACAE COMPLEX: NOT DETECTED
ENTEROBACTERIACEAE SPECIES: NOT DETECTED
ENTEROCOCCUS SPECIES: NOT DETECTED
ESCHERICHIA COLI: NOT DETECTED
Haemophilus influenzae: NOT DETECTED
Klebsiella oxytoca: NOT DETECTED
Klebsiella pneumoniae: NOT DETECTED
LISTERIA MONOCYTOGENES: NOT DETECTED
Neisseria meningitidis: NOT DETECTED
PSEUDOMONAS AERUGINOSA: NOT DETECTED
Proteus species: NOT DETECTED
SERRATIA MARCESCENS: NOT DETECTED
STAPHYLOCOCCUS AUREUS BCID: NOT DETECTED
STREPTOCOCCUS PNEUMONIAE: NOT DETECTED
STREPTOCOCCUS PYOGENES: NOT DETECTED
Staphylococcus species: NOT DETECTED
Streptococcus agalactiae: NOT DETECTED
Streptococcus species: NOT DETECTED

## 2016-02-26 LAB — APTT
APTT: 53 s — AB (ref 24–36)
aPTT: 58 seconds — ABNORMAL HIGH (ref 24–36)
aPTT: 76 seconds — ABNORMAL HIGH (ref 24–36)

## 2016-02-26 LAB — CBC
HCT: 38.8 % — ABNORMAL LOW (ref 40.0–52.0)
HEMOGLOBIN: 13 g/dL (ref 13.0–18.0)
MCH: 28.8 pg (ref 26.0–34.0)
MCHC: 33.4 g/dL (ref 32.0–36.0)
MCV: 86.3 fL (ref 80.0–100.0)
Platelets: 89 10*3/uL — ABNORMAL LOW (ref 150–440)
RBC: 4.49 MIL/uL (ref 4.40–5.90)
RDW: 23.3 % — ABNORMAL HIGH (ref 11.5–14.5)
WBC: 51.7 10*3/uL (ref 4.0–10.5)

## 2016-02-26 LAB — HEPARIN LEVEL (UNFRACTIONATED)
HEPARIN UNFRACTIONATED: 0.82 [IU]/mL — AB (ref 0.30–0.70)
Heparin Unfractionated: 0.84 IU/mL — ABNORMAL HIGH (ref 0.30–0.70)

## 2016-02-26 LAB — PROCALCITONIN: PROCALCITONIN: 1.16 ng/mL

## 2016-02-26 MED ORDER — FAMOTIDINE 20 MG PO TABS
20.0000 mg | ORAL_TABLET | Freq: Every day | ORAL | Status: DC
  Administered 2016-02-26 – 2016-02-28 (×3): 20 mg via ORAL
  Filled 2016-02-26 (×3): qty 1

## 2016-02-26 MED ORDER — INSULIN ASPART 100 UNIT/ML ~~LOC~~ SOLN
0.0000 [IU] | Freq: Three times a day (TID) | SUBCUTANEOUS | Status: DC
  Administered 2016-02-26: 3 [IU] via SUBCUTANEOUS
  Administered 2016-02-26: 2 [IU] via SUBCUTANEOUS
  Administered 2016-02-27 (×3): 3 [IU] via SUBCUTANEOUS
  Administered 2016-02-28: 2 [IU] via SUBCUTANEOUS
  Administered 2016-02-28: 3 [IU] via SUBCUTANEOUS
  Administered 2016-02-28: 2 [IU] via SUBCUTANEOUS
  Filled 2016-02-26: qty 2
  Filled 2016-02-26 (×4): qty 3
  Filled 2016-02-26: qty 2
  Filled 2016-02-26: qty 3
  Filled 2016-02-26: qty 2

## 2016-02-26 MED ORDER — CEFEPIME-DEXTROSE 2 GM/50ML IV SOLR
2.0000 g | Freq: Three times a day (TID) | INTRAVENOUS | Status: DC
  Administered 2016-02-26 – 2016-02-29 (×9): 2 g via INTRAVENOUS
  Filled 2016-02-26 (×11): qty 50

## 2016-02-26 MED ORDER — METHYLPREDNISOLONE SODIUM SUCC 40 MG IJ SOLR
40.0000 mg | Freq: Two times a day (BID) | INTRAMUSCULAR | Status: DC
  Administered 2016-02-26 – 2016-02-28 (×4): 40 mg via INTRAVENOUS
  Filled 2016-02-26 (×4): qty 1

## 2016-02-26 MED ORDER — MORPHINE SULFATE (PF) 2 MG/ML IV SOLN
2.0000 mg | INTRAVENOUS | Status: DC | PRN
  Administered 2016-02-26 – 2016-02-28 (×8): 2 mg via INTRAVENOUS
  Filled 2016-02-26 (×8): qty 1

## 2016-02-26 MED ORDER — INSULIN ASPART 100 UNIT/ML ~~LOC~~ SOLN: 0.0000 [IU] | Freq: Every day | SUBCUTANEOUS | Status: DC

## 2016-02-26 MED ORDER — SENNOSIDES-DOCUSATE SODIUM 8.6-50 MG PO TABS
2.0000 | ORAL_TABLET | Freq: Two times a day (BID) | ORAL | Status: DC
  Administered 2016-02-26 – 2016-02-28 (×4): 2 via ORAL
  Filled 2016-02-26 (×5): qty 2

## 2016-02-26 NOTE — Progress Notes (Signed)
ANTICOAGULATION CONSULT NOTE - Initial Consult  Pharmacy Consult for heparin drip Indication: ACS/STEMI/NSTEMI  Allergies  Allergen Reactions  . Aleve [Naproxen Sodium] Hives, Shortness Of Breath and Swelling    Naproxin    Patient Measurements: Height: '6\' 2"'$  (188 cm) Weight: 166 lb 3.6 oz (75.4 kg) IBW/kg (Calculated) : 82.2 Heparin Dosing Weight: 75kg  Vital Signs: Temp: 98.2 F (36.8 C) (10/20 0000) Temp Source: Oral (10/20 0000) BP: 122/84 (10/20 0400) Pulse Rate: 124 (10/20 0400)  Labs:  Recent Labs  03/06/2016 2054 02/25/16 0413  02/25/16 0644 02/25/16 1035 02/25/16 1433 02/25/16 2043 02/26/16 0411  HGB 12.8*  --   --   --  13.0  --   --  11.9*  HCT 39.2*  --   --   --  38.8*  --   --  36.0*  PLT 103*  --   --   --  89*  --   --  98*  APTT  --   --   < > 24  --  66* 62* 53*  LABPROT  --   --   --  16.9*  --   --   --   --   INR  --   --   --  1.36  --   --   --   --   HEPARINUNFRC  --   --   --  0.82*  --   --  1.02* 0.84*  CREATININE 1.07  --   --   --  1.09  --   --  1.16  TROPONINI 0.52* 4.15*  --   --  12.34* 19.65*  --   --   < > = values in this interval not displayed.  Estimated Creatinine Clearance: 68.6 mL/min (by C-G formula based on SCr of 1.16 mg/dL).   Medical History: Past Medical History:  Diagnosis Date  . Arthritis   . Atrial fibrillation ([place])   . Cancer ([place]) 2017   Lung  . CHF (congestive heart failure) ([place])   . Chronic kidney disease   . COPD (chronic obstructive pulmonary disease) ([place])   . Disease of lung   . DVT (deep venous thrombosis) ([facility])    left leg  . Dysrhythmia   . Headache   . History of chemotherapy   . History of radiation therapy   . Hypertension   . Pneumonia     Medications:  Pharmacy consulted to dose and monitor heparin drip in this 65 year old male admitted with ACS/NSTEMI.  On apixaban as outpatient so will need to dose off of aPTT until aPTT and HL coincide.  Assessment:  Goal of Therapy:   Heparin level 0.3-0.7 units/ml  APTT 66 - 102 seconds Monitor platelets by anticoagulation protocol: Yes   Plan:  10/19 aPTT @ 1433 = 66 seconds, which is therapeutic. Will continue heparin drip at current rate of 900 units/hr and check confirmatory aPTT in 6 hours.   10/19 @ 20:30 :   APTT = 62 seconds, HL = 1.02 Will increase heparin gtt rate to 1000 units/hr and recheck aPTT 6 hrs after rate change.  Will recheck HL on 10/20 with AM labs.   10/20 AM aPTT 53, heparin level 0.84. Increase rate to 1100 units/hr and recheck aPTT in 6 hours.  CBC to be checked daily  Aleczander Fandino S, PharmD Clinical Pharmacist 02/26/2016,5:24 AM

## 2016-02-26 NOTE — Progress Notes (Addendum)
ANTICOAGULATION CONSULT NOTE - Initial Consult  Pharmacy Consult for heparin drip Indication: ACS/STEMI/NSTEMI  Allergies  Allergen Reactions  . Aleve [Naproxen Sodium] Hives, Shortness Of Breath and Swelling    Naproxin    Patient Measurements: Height: '6\' 2"'$  (188 cm) Weight: 166 lb 3.6 oz (75.4 kg) IBW/kg (Calculated) : 82.2 Heparin Dosing Weight: 75kg  Vital Signs: Temp: 97.6 F (36.4 C) (10/20 0800) Temp Source: Oral (10/20 0800) BP: 135/90 (10/20 0900) Pulse Rate: 124 (10/20 0900)  Labs:  Recent Labs  02/10/2016 2054 02/25/16 0413  02/25/16 0644 02/25/16 1035 02/25/16 1433 02/25/16 2043 02/26/16 0411 02/26/16 1151  HGB 12.8*  --   --   --  13.0  --   --  11.9*  --   HCT 39.2*  --   --   --  38.8*  --   --  36.0*  --   PLT 103*  --   --   --  89*  --   --  98*  --   APTT  --   --   < > 24  --  66* 62* 53*  --   LABPROT  --   --   --  16.9*  --   --   --   --   --   INR  --   --   --  1.36  --   --   --   --   --   HEPARINUNFRC  --   --   < > 0.82*  --   --  1.02* 0.84* 0.82*  CREATININE 1.07  --   --   --  1.09  --   --  1.16  --   TROPONINI 0.52* 4.15*  --   --  12.34* 19.65*  --   --   --   < > = values in this interval not displayed.  Estimated Creatinine Clearance: 68.6 mL/min (by C-G formula based on SCr of 1.16 mg/dL).   Medical History: Past Medical History:  Diagnosis Date  . Arthritis   . Atrial fibrillation ([place])   . Cancer ([place]) 2017   Lung  . CHF (congestive heart failure) ([place])   . Chronic kidney disease   . COPD (chronic obstructive pulmonary disease) ([place])   . Disease of lung   . DVT (deep venous thrombosis) ([facility])    left leg  . Dysrhythmia   . Headache   . History of chemotherapy   . History of radiation therapy   . Hypertension   . Pneumonia     Medications:  Pharmacy consulted to dose and monitor heparin drip in this 65 year old male admitted with ACS/NSTEMI. On apixaban as outpatient so will need to dose off of aPTT until  aPTT and HL coincide. Patient currently ordered heparin drip at 1100 units/hr.   Assessment:  Goal of Therapy:  Heparin level 0.3-0.7 units/ml  APTT 66 - 102 seconds Monitor platelets by anticoagulation protocol: Yes   Plan:  Will increase heparin drip rate to 1300 units/hr and recheck aPTT 6 hrs after rate change.  Will recheck anti-Xa on 10/21 with AM labs  10/20 22:00 aPTT 76. Continue current regimen. Recheck aPTT with rest of AM labs.  Pharmacy will continue to monitor and adjust per consult.    Simpson,Michael L 02/26/2016,1:43 PM

## 2016-02-26 NOTE — Progress Notes (Signed)
Pharmacy Progress Note  Sean Hall is a 65 y.o. male admitted on 02/08/2016 with pneumonia.  Pharmacy has been consulted for cefepime dosing   Plan: 1. Antibiotics: Continue cefepime 2g IV Q8hr.    2. Electrolytes: no further replacement warranted at this time. Patient's last dose of furosemide scheduled for 2200. Will obtain follow up BMP with am labs.    Height: '6\' 2"'$  (188 cm) Weight: 166 lb 3.6 oz (75.4 kg) IBW/kg (Calculated) : 82.2  Temp (24hrs), Avg:97.9 F (36.6 C), Min:97.6 F (36.4 C), Max:98.2 F (36.8 C)   Recent Labs Lab 03/08/2016 2054 02/25/16 1035 02/26/16 0411  WBC 44.3* 51.7* 42.4*  CREATININE 1.07 1.09 1.16    Estimated Creatinine Clearance: 68.6 mL/min (by C-G formula based on SCr of 1.16 mg/dL).    Allergies  Allergen Reactions  . Aleve [Naproxen Sodium] Hives, Shortness Of Breath and Swelling    Naproxin    Antimicrobials this admission: vancomycin  10/18 >> 10/19 levofloxacin 10/18 >> 10/18 Cefepime 10/19 >>  Dose adjustments this admission: N/A  Microbiology results: 10/18 BCx: BCID - no organism detected.  10/19 MRSA PCR: negative   Pharmacy will continue to monitor and adjust per consult.    Simpson,Michael L 02/26/2016 3:36 PM

## 2016-02-26 NOTE — Progress Notes (Addendum)
SUBJECTIVE: The patient is having considerable respiratory difficulty this morning. Nursing had to work with him to bring his oxygen level up to be adequate to eat his breakfast which he was very eager to do. He denies any chest pain, only increased work of breathing.   Vitals:   02/26/16 0400 02/26/16 0500 02/26/16 0600 02/26/16 0700  BP: 122/84 134/86 136/85 137/87  Pulse: (!) 124 (!) 121 (!) 116 (!) 113  Resp: (!) '21 16 16 '$ (!) 22  Temp:  98.2 F (36.8 C)    TempSrc:  Oral    SpO2: 94% 96% 93% 91%  Weight:      Height:        Intake/Output Summary (Last 24 hours) at 02/26/16 0848 Last data filed at 02/26/16 3785  Gross per 24 hour  Intake           736.22 ml  Output             1195 ml  Net          -458.78 ml    LABS: Basic Metabolic Panel:  Recent Labs  02/25/16 1035 02/26/16 0411  NA 134* 133*  K 4.2 4.1  CL 97* 95*  CO2 24 27  GLUCOSE 175* 172*  BUN 40* 49*  CREATININE 1.09 1.16  CALCIUM 8.6* 8.4*   Liver Function Tests:  Recent Labs  02/14/2016 2054  AST 51*  ALT 51  ALKPHOS 90  BILITOT 0.8  PROT 6.7  ALBUMIN 3.3*   No results for input(s): LIPASE, AMYLASE in the last 72 hours. CBC:  Recent Labs  02/25/16 1035 02/26/16 0411  WBC 51.7* 42.4*  NEUTROABS  --  40.7*  HGB 13.0 11.9*  HCT 38.8* 36.0*  MCV 86.3 87.2  PLT 89* 98*   Cardiac Enzymes:  Recent Labs  02/25/16 0413 02/25/16 1035 02/25/16 1433  TROPONINI 4.15* 12.34* 19.65*   BNP: Invalid input(s): POCBNP D-Dimer: No results for input(s): DDIMER in the last 72 hours. Hemoglobin A1C: No results for input(s): HGBA1C in the last 72 hours. Fasting Lipid Panel: No results for input(s): CHOL, HDL, LDLCALC, TRIG, CHOLHDL, LDLDIRECT in the last 72 hours. Thyroid Function Tests: No results for input(s): TSH, T4TOTAL, T3FREE, THYROIDAB in the last 72 hours.  Invalid input(s): FREET3 Anemia Panel: No results for input(s): VITAMINB12, FOLATE, FERRITIN, TIBC, IRON, RETICCTPCT in the  last 72 hours.   PHYSICAL EXAM General: Well-developed, having significant increased work of breathing HEENT:  Normocephalic and atramatic Neck:  No JVD.  Lungs: Clear bilaterally to auscultation and percussion. Heart: HR tachycardic Abdomen: Bowel sounds are positive, abdomen soft and non-tender  Msk:  Back normal, normal gait. Normal strength and tone for age. Extremities: No clubbing, cyanosis or edema.   Neuro: Alert and oriented X 3. Psych:  Good affect, responds appropriately  TELEMETRY: Sinus tachycardia in the 120s  ASSESSMENT AND PLAN: Acute myocardial infarction in the setting of multiple medical problems including pulmonary embolism and lung cancer. He has acute on chronic hypoxic respiratory failure which may be related to pneumonia versus pulmonary edema versus advancing lung cancer and unknown contribution by cardiac ischemia. Echocardiogram shows severe LV systolic dysfunction with EF of 20% and akinesis of the septal wall. The patient is on BiPAP and having significant difficulty in breathing. The patient has a DO NOT RESUSCITATE order and has refused cardiac cath. We'll continue to monitor and treat as the patient desires.  Principal Problem:   Sepsis ([place]) Active Problems:   Congestive heart  failure ([facility])   Chronic obstructive pulmonary disease ([facility])   BP (high blood pressure)   Malignant neoplasm of right upper lobe of lung ([place])   HCAP (healthcare-associated pneumonia)   Pulmonary embolism ([place])   DNR (do not resuscitate)   Palliative care by specialist   Acute pulmonary edema ([place])   Acute respiratory failure ([place])   Hypoxia    Daune Perch, NP 02/26/2016 8:48 AM

## 2016-02-26 NOTE — Progress Notes (Signed)
[facility] NAME: Sean Hall    MR#:  [id number]  DATE OF BIRTH:  1951-04-10  SUBJECTIVE:  CHIEF COMPLAINT:   Chief Complaint  Patient presents with  . Chest Pain  . Shortness of Breath   ADMITTED FOR  Worsening shortness of breath. ON Bipap.  REVIEW OF SYSTEMS:    Review of Systems  Constitutional: Positive for malaise/fatigue. Negative for chills and fever.  HENT: Negative for sore throat.   Eyes: Negative for blurred vision, double vision and pain.  Respiratory: Positive for cough, sputum production, shortness of breath and wheezing. Negative for hemoptysis.   Cardiovascular: Positive for chest pain. Negative for palpitations, orthopnea and leg swelling.  Gastrointestinal: Negative for abdominal pain, constipation, diarrhea, heartburn, nausea and vomiting.  Genitourinary: Negative for dysuria and hematuria.  Musculoskeletal: Negative for back pain and joint pain.  Skin: Negative for rash.  Neurological: Positive for weakness. Negative for sensory change, speech change, focal weakness and headaches.  Endo/Heme/Allergies: Does not bruise/bleed easily.  Psychiatric/Behavioral: Negative for depression. The patient is not nervous/anxious.     DRUG ALLERGIES:   Allergies  Allergen Reactions  . Aleve [Naproxen Sodium] Hives, Shortness Of Breath and Swelling    Naproxin    VITALS:  Blood pressure 135/90, pulse (!) 124, temperature 97.6 F (36.4 C), temperature source Oral, resp. rate (!) 27, height '6\' 2"'$  (1.88 m), weight 75.4 kg (166 lb 3.6 oz), SpO2 96 %.  PHYSICAL EXAMINATION:   Physical Exam  GENERAL:  65 y.o.-year-old patient lying in the bed looks critically ill. Bipap EYES: Pupils equal, round, reactive to light and accommodation. No scleral icterus. Extraocular muscles intact.  HEENT: Head atraumatic, normocephalic. Oropharynx and nasopharynx clear.  NECK:  Supple, no jugular venous distention. No thyroid  enlargement, no tenderness.  LUNGS: Increased work breathing. Bilateral crackles and wheezing CARDIOVASCULAR: S1, S2 normal. No murmurs, rubs, or gallops.  ABDOMEN: Soft, nontender, nondistended. Bowel sounds present. No organomegaly or mass.  EXTREMITIES: No cyanosis, clubbing or edema b/l.    NEUROLOGIC: Cranial nerves II through XII are intact. No focal Motor or sensory deficits b/l.   PSYCHIATRIC: The patient is alert and oriented x 3.  SKIN: No obvious rash, lesion, or ulcer.   LABORATORY PANEL:   CBC  Recent Labs Lab 02/26/16 0411  WBC 42.4*  HGB 11.9*  HCT 36.0*  PLT 98*   ------------------------------------------------------------------------------------------------------------------ Chemistries   Recent Labs Lab 02/18/2016 2054  02/26/16 0411  NA 132*  < > 133*  K 4.0  < > 4.1  CL 96*  < > 95*  CO2 24  < > 27  GLUCOSE 199*  < > 172*  BUN 34*  < > 49*  CREATININE 1.07  < > 1.16  CALCIUM 8.8*  < > 8.4*  AST 51*  --   --   ALT 51  --   --   ALKPHOS 90  --   --   BILITOT 0.8  --   --   < > = values in this interval not displayed. ------------------------------------------------------------------------------------------------------------------  Cardiac Enzymes  Recent Labs Lab 02/25/16 1433  TROPONINI 19.65*   ------------------------------------------------------------------------------------------------------------------  RADIOLOGY:  Ct Angio Chest Pe W And/or Wo Contrast  Result Date: 02/18/2016 CLINICAL DATA:  Initial evaluation for acute shortness of breath. History of lung carcinoma. EXAM: CT ANGIOGRAPHY CHEST WITH CONTRAST TECHNIQUE: Multidetector CT imaging of the chest was performed using the standard protocol during bolus administration of intravenous contrast. Multiplanar  CT image reconstructions and MIPs were obtained to evaluate the vascular anatomy. CONTRAST:  75 cc of Isovue 370. COMPARISON:  Prior MRI from 02/13/2016 as well as prior CT  09/22/2015. Comparison also made with prior radiograph from earlier the same day. FINDINGS: Cardiovascular: Intrathoracic aorta of normal caliber without acute abnormality. Scattered atheromatous plaque within the intrathoracic aorta. Visualized great vessels demonstrate no acute abnormality. Mild cardiomegaly with left atrial enlargement. Three vessel coronary artery calcifications. The no pericardial effusion. Pulmonary arteries adequately opacified for evaluation. Main pulmonary artery measures within normal limits for size at 2.8 cm in diameter. Few small linear filling defects are present within the left lower lobe segmental arteries (series 5, image 175, 200, 223), consistent with small acute pulmonary emboli. No other definite focal filling defects are identified elsewhere within the lungs. No findings to suggest right heart strain. RV LV ratio within normal limits. Mediastinum/Nodes: Thyroid within normal limits. Right peritracheal lymph nodes measure up to 14 mm (series 4, images 34). Sub carinal nodal conglomerate measures 2.2 cm. Right hilar soft tissue fullness/nodal mass measures 3.9 x 2.2 cm. No left hilar adenopathy. Shotty sub cm prevascular and left paratracheal nodes noted. Mildly prominent right cardiophrenic nodes measure up to 9 mm (series 4, image 81). Esophagus within normal limits. No axillary adenopathy. Lungs/Pleura: Irregular mass like opacity at the right lung apex again seen, measuring approximately 5.4 x 7.2 x 6.5 cm on today's study. Multiple right upper lobe segmental artery seen coursing through the mass, which are somewhat attenuated and narrowed. Mass approximates the right upper margin of the mediastinum, and may directly invade the mediastinum. Previously noted direct wall invasion better evaluated on recent MRI. Sub cm right supraclavicular lymph node noted. Irregular right pleural effusion is present. Extensive multifocal areas of ground-glass with interlobular septal  thickening present throughout the lungs bilaterally (crazy paving), suspected to reflect pulmonary edema, superimposed on underlying emphysema. More confluent irregular areas of opacity within the right upper and mid lung may reflect atelectasis and/or areas of infection/infiltrate. Superimposed lymphangitic spread of tumor may be present as well. 19 mm pleural-based nodule noted at the mid right lung (series 6, image 64). Upper Abdomen: 11 mm hypodensity within the medial right hepatic lobe noted, stable, indeterminate. Remainder the visualized upper abdomen unremarkable. Musculoskeletal: No acute osseous abnormality. No worrisome lytic or blastic osseous lesions. Review of the MIP images confirms the above findings. IMPRESSION: 1. Few scattered small filling defects with faint left lower lobe segmental and subsegmental pulmonary arteries as above, consistent with acute pulmonary emboli. No evidence for right heart strain. 2. Persistent right apical lung mass, compatible with known primary bronchogenic carcinoma. Lesion is decreased in size from 09/22/2015, measuring approximately 5.4 x 7.3 x 6.5 cm on today's study. Possible direct mediastinal invasion again noted. Mediastinal and right hilar as above. 3. Increased patchy parenchymal opacity within the right upper and mid lung. While this finding may in part reflect atelectasis, possible postobstructive pneumonia could also be considered. 4. Patchy multifocal ground-glass opacity with interlobular septal thickening throughout the lungs, suspected to reflect pulmonary edema superimposed on underlying emphysema. 5. Left pleural effusion. Critical Value/emergent results were called by telephone at the time of interpretation on 02/15/2016 at 11:05 pm to Dr. Harvest Dark , who verbally acknowledged these results. Electronically Signed   By: Jeannine Boga M.D.   On: 02/13/2016 23:10   Dg Chest Port 1 View  Result Date: 02/26/2016 CLINICAL DATA:   Respiratory failure. EXAM: PORTABLE CHEST 1 VIEW COMPARISON:  CT 03/05/2016.  Chest x-ray 03/04/2016. FINDINGS: PowerPort catheter stable position. Persistent right apical pleural-parenchymal thickening/mass noted. Persistent infiltrate in the right mid lung. Persistent diffuse interstitial prominence without interval change. Heart size stable. No prominent pleural effusion or pneumothorax . IMPRESSION: 1. PowerPort catheter in stable position. Persistent right apical pleural-parenchymal thickening/ mass without interval change. 2. Persistent unchanged right mid lung alveolar infiltrate. Persistent unchanged diffuse interstitial prominence. Electronically Signed   By: Marcello Moores  Register   On: 02/26/2016 07:31   Dg Chest Portable 1 View  Result Date: 03/06/2016 CLINICAL DATA:  Shortness of breath and chest pain.  Lung carcinoma. EXAM: PORTABLE CHEST 1 VIEW COMPARISON:  February 06, 2016 FINDINGS: There is new airspace opacity in the right mid lung, concerning for acute pneumonia. There is widespread fibrosis bilaterally, much more severe on the right than on the left. There is asymmetric right apical pleural thickening,, likely due to radiation therapy change. There may be residual mass in this area. Fullness in the right hilum raises concern for adenopathy. Heart size is normal. Pulmonary vascularity is stable with some distortion due to the extensive fibrosis. Port-A-Cath tip is in the left innominate vein. No pneumothorax. No blastic or lytic bone lesions are evident. IMPRESSION: Suspect pneumonia right mid lung. Extensive fibrosis is noted bilaterally, more on the right than on the left. Suspect radiation therapy change in the right apex. Residual mass in this area may well be present. There is concern for right hilar adenopathy. Stable cardiac silhouette. Port-A-Cath tip in left innominate vein. No pneumothorax. Electronically Signed   By: Lowella Grip III M.D.   On: 03/05/2016 21:54     ASSESSMENT  AND PLAN:   * healthcare acquired pneumonia with acute on chronic respiratory failure and sepsis Continue IV antibiotics. Continue BiPAP support. Patient is critically ill. DO NOT resuscitate. Consulted pulmonary  * left lower lobe pulmonary embolism Xarelto held. Started on heparin drip. 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 Management plans discussed with the patient, family and they are in agreement.  CODE STATUS: DNR  DVT Prophylaxis: SCDs  TOTAL CC TIME TAKING CARE OF THIS PATIENT: 40 minutes.   Discussed with patient's wife regarding poor prognosis. We'll continue BiPAP at this point. If there is any worsening or no improvement tomorrow patient will be transitioned to comfort measures.   Hillary Bow R M.D on 02/26/2016 at 1:18 PM  Between 7am to 6pm - Pager - [phone]  After 6pm go to [email or web address] - password [email or web address] [facility]  Office  [phone]  CC: Primary care physician; Lloyd Huger, MD  Note: This dictation was prepared with Dragon dictation along with smaller phrase technology. Any transcriptional errors that result from this process are unintentional.

## 2016-02-26 NOTE — Progress Notes (Signed)
Pt stated he couldn't tolerate the BIPAP anymore. Placed pt back on HFNC at 100%. SPO2 90%, RN aware.

## 2016-02-26 NOTE — Progress Notes (Signed)
Chaplain rounded the unit to provide a compassionate presence and support to the patient.  Advance Directive information was given to the patient. Patient conferred with family and it was completed.  Minerva Fester  [phone]

## 2016-02-26 NOTE — Progress Notes (Addendum)
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 The patient has opted not to undergo cardiac catheterization, he is DO NOT RESUSCITATE.  ASSESSMENT / PLAN:  PULMONARY A: Acute on chronic hypoxic respiratory failure secondary to HCAP vs Pulmonary Edema, AECOPD, LLL PE and Malignant Neoplasm of RLL Review of the chest x-ray, and recent CT images: Diffuse pulmonary edema, changes of the mild basilar pulmonary fibrosis, changes of diffuse right-sided interstitial changes which could be pulmonary edema versus advancing cancer. P: Continue high flow for now wean as tolerated Maintain O2 sats 92% or above  Continue Bronchodilators Wean down steroids Prn CXR Prn ABG's Heparin gtt dosing per pharmacy  CARDIOVASCULAR A:  Hypertension Elevated troponin's Hx: Atrial Fibrillation and Deep Venous Thrombosis P:  Heparin gtt Trend troponin's Cardiology consulted appreciate input Pt refusing cardiac cath at this time Continue lasix  RENAL A:   No acute issues P:   Trend BMP's Replace electrolytes as indicated Monitor UOP  GASTROINTESTINAL A:   No acute issues P:   Advance diet.   HEMATOLOGIC A:   Anemia-improving Malignant neoplasm RLL with mets to the brain Leukocytosis  P:  Heparin gtt dosing per pharmacy Trend CBC's Monitor for s/sx of bleeding Transfuse for hgb <7 --On brain irradiation, currently being held.   INFECTIOUS A:   HCAP P:   Trend WBC's and monitor fever curve Trend PCT's Continue abx as listed above Follow cultures Flu panel pending   CULTURES: Blood x2 10/18>> negative thus far.  MRSA screen 10/19>> negative.   ANTIBIOTICS: Cefepime 10/18>> Vancomycin 10/18>>10/20  LINES/TUBES: PIV's x2 10/18>> Left chest portacath>>   ENDOCRINE A:     Hyperglycemia P:   SSI CBG's q6hrs  NEUROLOGIC A:   Malignant neoplasm RLL with mets to the brain Chronic pain P:   Continue oxycodone and transdermal fentanyl for pain management Lights on during the day Promote family presence at bedside   Best Practices  DVT Prophylaxis: on heparin.  GI Prophylaxis: famotidine.  SIGNIFICANT EVENTS: 10/18-Pt admitted to [facility] telemetry unit due to acute on chronic hypoxic respiratory failure 10/18-Pt transferred to ICU due to worsening acute on chronic hypoxic respiratory failure requiring bipap PCCM consulted for further management   ---------------------------------------   ----------------------------------------   Name: Sean Hall MRN: [id number] DOB: [birth year]    ADMISSION DATE:  02/12/2016    SUBJECTIVE:   Patient is awake and alert, he is in good spirits, he is not in respiratory distress, though he remains on 100% high flow.  Review of Systems:  Constitutional: Feels well. Cardiovascular: No chest pain.  Pulmonary: Denies dyspnea.   The remainder of systems were reviewed and were found to be negative other than what is documented in the HPI.    VITAL SIGNS: Temp:  [97.7 F (36.5 C)-98.2 F (36.8 C)] 98.2 F (36.8 C) (10/20 0500) Pulse Rate:  [105-130] 113 (10/20 0700) Resp:  [13-31] 22 (10/20 0700) BP: (122-168)/(82-149) 137/87 (10/20 0700) SpO2:  [87 %-99 %] 91 % (10/20 0700) FiO2 (%):  [100 %] 100 % (10/20 0742) HEMODYNAMICS:   VENTILATOR SETTINGS: FiO2 (%):  [100 %] 100 % INTAKE / OUTPUT:  Intake/Output Summary (Last 24 hours) at 02/26/16 0808 Last data filed at 02/26/16 0609  Gross per 24 hour  Intake           736.22 ml  Output  1195 ml  Net          -458.78 ml    PHYSICAL EXAMINATION: Physical Examination:   VS: BP 137/87   Pulse (!) 113   Temp 98.2 F (36.8 C) (Oral)   Resp (!) 22   Ht '6\' 2"'$  (1.88 m)   Wt 166 lb 3.6 oz (75.4 kg)   SpO2 91%   BMI 21.34 kg/m   General  Appearance: No distress  Neuro:without focal findings, mental status normal. HEENT: PERRLA, EOM intact. Pulmonary: Bilateral diffuse crackles  CardiovascularNormal S1,S2.  No m/r/g.   Abdomen: Benign, Soft, non-tender. Renal:  No costovertebral tenderness  GU:  Not performed at this time. Endocrine: No evident thyromegaly. Skin:   warm, no rashes, no ecchymosis  Extremities: normal, no cyanosis, clubbing.   LABS:   LABORATORY PANEL:   CBC  Recent Labs Lab 02/26/16 0411  WBC 42.4*  HGB 11.9*  HCT 36.0*  PLT 98*    Chemistries   Recent Labs Lab 02/18/2016 2054  02/26/16 0411  NA 132*  < > 133*  K 4.0  < > 4.1  CL 96*  < > 95*  CO2 24  < > 27  GLUCOSE 199*  < > 172*  BUN 34*  < > 49*  CREATININE 1.07  < > 1.16  CALCIUM 8.8*  < > 8.4*  AST 51*  --   --   ALT 51  --   --   ALKPHOS 90  --   --   BILITOT 0.8  --   --   < > = values in this interval not displayed.   Recent Labs Lab 02/25/16 0227 02/25/16 0910 02/25/16 1532 02/25/16 2133  GLUCAP 206* 171* 174* 174*   No results for input(s): PHART, PCO2ART, PO2ART in the last 168 hours.  Recent Labs Lab 02/17/2016 2054  AST 51*  ALT 51  ALKPHOS 90  BILITOT 0.8  ALBUMIN 3.3*    Cardiac Enzymes  Recent Labs Lab 02/25/16 1433  TROPONINI 19.65*    RADIOLOGY:  Ct Angio Chest Pe W And/or Wo Contrast  Result Date: 02/11/2016 CLINICAL DATA:  Initial evaluation for acute shortness of breath. History of lung carcinoma. EXAM: CT ANGIOGRAPHY CHEST WITH CONTRAST TECHNIQUE: Multidetector CT imaging of the chest was performed using the standard protocol during bolus administration of intravenous contrast. Multiplanar CT image reconstructions and MIPs were obtained to evaluate the vascular anatomy. CONTRAST:  75 cc of Isovue 370. COMPARISON:  Prior MRI from 02/13/2016 as well as prior CT 09/22/2015. Comparison also made with prior radiograph from earlier the same day. FINDINGS: Cardiovascular: Intrathoracic aorta  of normal caliber without acute abnormality. Scattered atheromatous plaque within the intrathoracic aorta. Visualized great vessels demonstrate no acute abnormality. Mild cardiomegaly with left atrial enlargement. Three vessel coronary artery calcifications. The no pericardial effusion. Pulmonary arteries adequately opacified for evaluation. Main pulmonary artery measures within normal limits for size at 2.8 cm in diameter. Few small linear filling defects are present within the left lower lobe segmental arteries (series 5, image 175, 200, 223), consistent with small acute pulmonary emboli. No other definite focal filling defects are identified elsewhere within the lungs. No findings to suggest right heart strain. RV LV ratio within normal limits. Mediastinum/Nodes: Thyroid within normal limits. Right peritracheal lymph nodes measure up to 14 mm (series 4, images 34). Sub carinal nodal conglomerate measures 2.2 cm. Right hilar soft tissue fullness/nodal mass measures 3.9 x 2.2 cm. No left hilar adenopathy. Shotty sub cm  prevascular and left paratracheal nodes noted. Mildly prominent right cardiophrenic nodes measure up to 9 mm (series 4, image 81). Esophagus within normal limits. No axillary adenopathy. Lungs/Pleura: Irregular mass like opacity at the right lung apex again seen, measuring approximately 5.4 x 7.2 x 6.5 cm on today's study. Multiple right upper lobe segmental artery seen coursing through the mass, which are somewhat attenuated and narrowed. Mass approximates the right upper margin of the mediastinum, and may directly invade the mediastinum. Previously noted direct wall invasion better evaluated on recent MRI. Sub cm right supraclavicular lymph node noted. Irregular right pleural effusion is present. Extensive multifocal areas of ground-glass with interlobular septal thickening present throughout the lungs bilaterally (crazy paving), suspected to reflect pulmonary edema, superimposed on underlying  emphysema. More confluent irregular areas of opacity within the right upper and mid lung may reflect atelectasis and/or areas of infection/infiltrate. Superimposed lymphangitic spread of tumor may be present as well. 19 mm pleural-based nodule noted at the mid right lung (series 6, image 64). Upper Abdomen: 11 mm hypodensity within the medial right hepatic lobe noted, stable, indeterminate. Remainder the visualized upper abdomen unremarkable. Musculoskeletal: No acute osseous abnormality. No worrisome lytic or blastic osseous lesions. Review of the MIP images confirms the above findings. IMPRESSION: 1. Few scattered small filling defects with faint left lower lobe segmental and subsegmental pulmonary arteries as above, consistent with acute pulmonary emboli. No evidence for right heart strain. 2. Persistent right apical lung mass, compatible with known primary bronchogenic carcinoma. Lesion is decreased in size from 09/22/2015, measuring approximately 5.4 x 7.3 x 6.5 cm on today's study. Possible direct mediastinal invasion again noted. Mediastinal and right hilar as above. 3. Increased patchy parenchymal opacity within the right upper and mid lung. While this finding may in part reflect atelectasis, possible postobstructive pneumonia could also be considered. 4. Patchy multifocal ground-glass opacity with interlobular septal thickening throughout the lungs, suspected to reflect pulmonary edema superimposed on underlying emphysema. 5. Left pleural effusion. Critical Value/emergent results were called by telephone at the time of interpretation on 03/02/2016 at 11:05 pm to Dr. Harvest Dark , who verbally acknowledged these results. Electronically Signed   By: Jeannine Boga M.D.   On: 03/03/2016 23:10   Dg Chest Port 1 View  Result Date: 02/26/2016 CLINICAL DATA:  Respiratory failure. EXAM: PORTABLE CHEST 1 VIEW COMPARISON:  CT 02/20/2016.  Chest x-ray 02/29/2016. FINDINGS: PowerPort catheter stable  position. Persistent right apical pleural-parenchymal thickening/mass noted. Persistent infiltrate in the right mid lung. Persistent diffuse interstitial prominence without interval change. Heart size stable. No prominent pleural effusion or pneumothorax . IMPRESSION: 1. PowerPort catheter in stable position. Persistent right apical pleural-parenchymal thickening/ mass without interval change. 2. Persistent unchanged right mid lung alveolar infiltrate. Persistent unchanged diffuse interstitial prominence. Electronically Signed   By: Marcello Moores  Register   On: 02/26/2016 07:31   Dg Chest Portable 1 View  Result Date: 02/16/2016 CLINICAL DATA:  Shortness of breath and chest pain.  Lung carcinoma. EXAM: PORTABLE CHEST 1 VIEW COMPARISON:  February 06, 2016 FINDINGS: There is new airspace opacity in the right mid lung, concerning for acute pneumonia. There is widespread fibrosis bilaterally, much more severe on the right than on the left. There is asymmetric right apical pleural thickening,, likely due to radiation therapy change. There may be residual mass in this area. Fullness in the right hilum raises concern for adenopathy. Heart size is normal. Pulmonary vascularity is stable with some distortion due to the extensive fibrosis. Port-A-Cath tip is  in the left innominate vein. No pneumothorax. No blastic or lytic bone lesions are evident. IMPRESSION: Suspect pneumonia right mid lung. Extensive fibrosis is noted bilaterally, more on the right than on the left. Suspect radiation therapy change in the right apex. Residual mass in this area may well be present. There is concern for right hilar adenopathy. Stable cardiac silhouette. Port-A-Cath tip in left innominate vein. No pneumothorax. Electronically Signed   By: Lowella Grip III M.D.   On: 03/08/2016 21:54       --Marda Stalker, MD.  ICU Pager: [phone] [facility] Office Number: [phone]  Patricia Pesa, M.D.  Vilinda Boehringer, M.D.  Merton Border, M.D  02/26/2016

## 2016-02-26 NOTE — Progress Notes (Signed)
MD Ram rounded at patients bedside and pointed out patient had a rash on bilateral arms and chest. MD Ram assessed rash. No new orders at this time. Patient not complaining of itching at this time. Will continue to monitor patient.

## 2016-02-27 ENCOUNTER — Inpatient Hospital Stay: Payer: Medicaid Other

## 2016-02-27 DIAGNOSIS — A419 Sepsis, unspecified organism: Principal | ICD-10-CM

## 2016-02-27 LAB — CBC
HCT: 30.4 % — ABNORMAL LOW (ref 40.0–52.0)
Hemoglobin: 10.3 g/dL — ABNORMAL LOW (ref 13.0–18.0)
MCH: 29.6 pg (ref 26.0–34.0)
MCHC: 34.1 g/dL (ref 32.0–36.0)
MCV: 86.9 fL (ref 80.0–100.0)
PLATELETS: 93 10*3/uL — AB (ref 150–440)
RBC: 3.49 MIL/uL — ABNORMAL LOW (ref 4.40–5.90)
RDW: 22.6 % — ABNORMAL HIGH (ref 11.5–14.5)
WBC: 24.1 10*3/uL — ABNORMAL HIGH (ref 3.8–10.6)

## 2016-02-27 LAB — BASIC METABOLIC PANEL
Anion gap: 9 (ref 5–15)
BUN: 49 mg/dL — AB (ref 6–20)
CHLORIDE: 94 mmol/L — AB (ref 101–111)
CO2: 30 mmol/L (ref 22–32)
CREATININE: 1.09 mg/dL (ref 0.61–1.24)
Calcium: 8.4 mg/dL — ABNORMAL LOW (ref 8.9–10.3)
GFR calc Af Amer: >60 mL/min (ref >60)
GLUCOSE: 165 mg/dL — AB (ref 65–99)
Potassium: 3.5 mmol/L (ref 3.5–5.1)
SODIUM: 133 mmol/L — AB (ref 135–145)

## 2016-02-27 LAB — HEPARIN LEVEL (UNFRACTIONATED)
HEPARIN UNFRACTIONATED: 0.75 [IU]/mL — AB (ref 0.30–0.70)
Heparin Unfractionated: 1.12 IU/mL — ABNORMAL HIGH (ref 0.30–0.70)

## 2016-02-27 LAB — APTT
APTT: 69 s — AB (ref 24–36)
APTT: 65 s — AB (ref 24–36)
aPTT: 102 seconds — ABNORMAL HIGH (ref 24–36)

## 2016-02-27 LAB — PROCALCITONIN: PROCALCITONIN: 0.91 ng/mL

## 2016-02-27 MED ORDER — ORAL CARE MOUTH RINSE
15.0000 mL | Freq: Two times a day (BID) | OROMUCOSAL | Status: DC
  Administered 2016-02-27 – 2016-02-28 (×2): 15 mL via OROMUCOSAL

## 2016-02-27 MED ORDER — METOPROLOL TARTRATE 25 MG PO TABS
12.5000 mg | ORAL_TABLET | Freq: Two times a day (BID) | ORAL | Status: DC
  Administered 2016-02-27 – 2016-02-28 (×3): 12.5 mg via ORAL
  Filled 2016-02-27 (×3): qty 1

## 2016-02-27 NOTE — Progress Notes (Signed)
ANTICOAGULATION CONSULT NOTE - Initial Consult  Pharmacy Consult for heparin drip Indication: ACS/STEMI/NSTEMI  Allergies  Allergen Reactions  . Aleve [Naproxen Sodium] Hives, Shortness Of Breath and Swelling    Naproxin    Patient Measurements: Height: '6\' 2"'$  (188 cm) Weight: 158 lb 11.7 oz (72 kg) IBW/kg (Calculated) : 82.2 Heparin Dosing Weight: 75kg  Vital Signs: Temp: 98 F (36.7 C) (10/21 0200) Temp Source: Axillary (10/21 0200) BP: 128/83 (10/21 0500) Pulse Rate: 106 (10/21 0500)  Labs:  Recent Labs  02/25/16 0413  02/25/16 0644 02/25/16 1035 02/25/16 1433  02/26/16 0411 02/26/16 1151 02/26/16 2155 02/27/16 0503  HGB  --   --   --  13.0  --   --  11.9*  --   --  10.3*  HCT  --   --   --  38.8*  --   --  36.0*  --   --  30.4*  PLT  --   --   --  89*  --   --  98*  --   --  93*  APTT  --   < > 24  --  66*  < > 53* 58* 76* 102*  LABPROT  --   --  16.9*  --   --   --   --   --   --   --   INR  --   --  1.36  --   --   --   --   --   --   --   HEPARINUNFRC  --   --  0.82*  --   --   < > 0.84* 0.82*  --  1.12*  CREATININE  --   --   --  1.09  --   --  1.16  --   --  1.09  TROPONINI 4.15*  --   --  12.34* 19.65*  --   --   --   --   --   < > = values in this interval not displayed.  Estimated Creatinine Clearance: 69.7 mL/min (by C-G formula based on SCr of 1.09 mg/dL).   Medical History: Past Medical History:  Diagnosis Date  . Arthritis   . Atrial fibrillation ([place])   . Cancer ([place]) 2017   Lung  . CHF (congestive heart failure) ([place])   . Chronic kidney disease   . COPD (chronic obstructive pulmonary disease) ([place])   . Disease of lung   . DVT (deep venous thrombosis) ([facility])    left leg  . Dysrhythmia   . Headache   . History of chemotherapy   . History of radiation therapy   . Hypertension   . Pneumonia     Medications:  Pharmacy consulted to dose and monitor heparin drip in this 65 year old male admitted with ACS/NSTEMI. On apixaban as  outpatient so will need to dose off of aPTT until aPTT and HL coincide. Patient currently ordered heparin drip at 1100 units/hr.   Assessment:  Goal of Therapy:  Heparin level 0.3-0.7 units/ml  APTT 66 - 102 seconds Monitor platelets by anticoagulation protocol: Yes   Plan:  Will increase heparin drip rate to 1300 units/hr and recheck aPTT 6 hrs after rate change.  Will recheck anti-Xa on 10/21 with AM labs  10/20 22:00 aPTT 76. Continue current regimen. Recheck aPTT with rest of AM labs.  10/21 0503 APTT 102. Continue current regimen. Recheck APTT in 6 hours @ 1200. Heparin level recheck with am  labs.    Pharmacy will continue to monitor and adjust per consult.    Jahdiel Krol D 02/27/2016,8:42 AM

## 2016-02-27 NOTE — Progress Notes (Signed)
Writer asked patient if he would like to bathe and have bed changed because he has been perspiring and sheet is damp.  Patient has deferred for now.

## 2016-02-27 NOTE — Progress Notes (Signed)
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 The patient has opted not to undergo cardiac catheterization, he is DO NOT RESUSCITATE.  ASSESSMENT / PLAN:  PULMONARY A: Acute on chronic hypoxic respiratory failure secondary to HCAP vs Pulmonary Edema, AECOPD, LLL PE and Malignant Neoplasm of RLL Review of the chest x-rayImage. This a.m., increased right-sided infiltrates, which could represent pneumonic process versus atelectasis versus pulmonary edema. P: Patient was on BiPAP overnight, we'll try high flow today. Maintain O2 sats 92% or above  Continue Bronchodilators Wean down steroids Prn CXR Prn ABG's Heparin gtt dosing per pharmacy  CARDIOVASCULAR A:  Hypertension Elevated troponin's Hx: Atrial Fibrillation and Deep Venous Thrombosis P:  Heparin gtt Trend troponin's Cardiology consulted appreciate input Pt refusing cardiac cath at this time Continue lasix  RENAL A:   Acute kidney injury, slight improvement.  P:   Trend BMP's Replace electrolytes as indicated Monitor UOP  GASTROINTESTINAL A:   No acute issues P:   Advance diet.   HEMATOLOGIC A:   Anemia-improving Malignant neoplasm RLL with mets to the brain Leukocytosis  P:  Heparin gtt dosing per pharmacy Trend CBC's Monitor for s/sx of bleeding Transfuse for hgb <7 --On brain irradiation, currently being held.   INFECTIOUS A:   HCAP P:   Trend WBC's and monitor fever curve Trend PCT's Continue abx as listed above Follow cultures Flu panel pending   CULTURES: Blood x2 10/18>> negative thus far.  MRSA screen 10/19>> negative.   ANTIBIOTICS: Cefepime 10/18>> Vancomycin 10/18>>10/20  LINES/TUBES: PIV's x2 10/18>> Left chest portacath>>   ENDOCRINE A:   Hyperglycemia P:   SSI CBG's  q6hrs  NEUROLOGIC A:   Malignant neoplasm RLL with mets to the brain Chronic pain P:   Continue oxycodone and transdermal fentanyl for pain management Lights on during the day Promote family presence at bedside   Best Practices  DVT Prophylaxis: on heparin.  GI Prophylaxis: famotidine.  SIGNIFICANT EVENTS: 10/18-Pt admitted to [facility] telemetry unit due to acute on chronic hypoxic respiratory failure 10/18-Pt transferred to ICU due to worsening acute on chronic hypoxic respiratory failure requiring bipap PCCM consulted for further management   ---------------------------------------   ----------------------------------------   Name: Sean Hall MRN: [id number] DOB: 1950/07/19    ADMISSION DATE:  02/21/2016    SUBJECTIVE:   Patient is awake and alert, he is in good spirits, he is on bipap at 10/5 @ 100%, he is conversational. He got married yesterday.   Review of Systems:  Constitutional: Feels well. Cardiovascular: No chest pain.  Pulmonary: Denies dyspnea.   The remainder of systems were reviewed and were found to be negative other than what is documented in the HPI.    VITAL SIGNS: Temp:  [97.6 F (36.4 C)-98.1 F (36.7 C)] 98 F (36.7 C) (10/21 0200) Pulse Rate:  [98-132] 106 (10/21 0500) Resp:  [13-32] 15 (10/21 0500) BP: (119-176)/(75-105) 128/83 (10/21 0500) SpO2:  [88 %-99 %] 98 % (10/21 0500) FiO2 (%):  [100 %] 100 % (10/21 0353) Weight:  [158 lb 11.7 oz (72 kg)] 158 lb 11.7 oz (72 kg) (10/21 0444) HEMODYNAMICS:   VENTILATOR SETTINGS: FiO2 (%):  [100 %] 100 % INTAKE / OUTPUT:  Intake/Output Summary (Last 24 hours) at 02/27/16 0740 Last data filed at 02/27/16 0500  Gross per 24 hour  Intake  714.95 ml  Output             1600 ml  Net          -885.05 ml    PHYSICAL EXAMINATION: Physical Examination:   VS: BP 128/83   Pulse (!) 106   Temp 98 F (36.7 C) (Axillary)   Resp 15   Ht '6\' 2"'$  (1.88 m)   Wt 158 lb 11.7 oz (72 kg)    SpO2 98%   BMI 20.38 kg/m   General Appearance: No distress  Neuro:without focal findings, mental status normal. HEENT: PERRLA, EOM intact. Pulmonary: Bilateral diffuse crackles  CardiovascularNormal S1,S2.  No m/r/g.   Abdomen: Benign, Soft, non-tender. Renal:  No costovertebral tenderness  GU:  Not performed at this time. Endocrine: No evident thyromegaly. Skin:   warm, no rashes, no ecchymosis  Extremities: normal, no cyanosis, clubbing.   LABS:   LABORATORY PANEL:   CBC  Recent Labs Lab 02/27/16 0503  WBC 24.1*  HGB 10.3*  HCT 30.4*  PLT 93*    Chemistries   Recent Labs Lab 03/02/2016 2054  02/27/16 0503  NA 132*  < > 133*  K 4.0  < > 3.5  CL 96*  < > 94*  CO2 24  < > 30  GLUCOSE 199*  < > 165*  BUN 34*  < > 49*  CREATININE 1.07  < > 1.09  CALCIUM 8.8*  < > 8.4*  AST 51*  --   --   ALT 51  --   --   ALKPHOS 90  --   --   BILITOT 0.8  --   --   < > = values in this interval not displayed.   Recent Labs Lab 02/25/16 0227 02/25/16 0910 02/25/16 1532 02/25/16 2133  GLUCAP 206* 171* 174* 174*   No results for input(s): PHART, PCO2ART, PO2ART in the last 168 hours.  Recent Labs Lab 02/22/2016 2054  AST 51*  ALT 51  ALKPHOS 90  BILITOT 0.8  ALBUMIN 3.3*    Cardiac Enzymes  Recent Labs Lab 02/25/16 1433  TROPONINI 19.65*    RADIOLOGY:  Dg Chest 1 View  Result Date: 02/27/2016 CLINICAL DATA:  65 year old male with shortness of breath EXAM: CHEST 1 VIEW COMPARISON:  Chest radiograph dated 02/26/2016 FINDINGS: Left pectoral Port-A-Cath with tip in stable positioning likely at the junction of the SVC/left innominate vein. Right apical pleural thickening and masslike opacity similar to prior radiograph. There is persistent diffuse interstitial prominence similar to the prior radiograph. Stable cardiac silhouette. No pleural effusion or pneumothorax. No acute osseous pathology. IMPRESSION: No significant interval change in the right apical  opacity and pleural parenchymal densities. No pneumothorax. Stable positioning of the left pectoral Port-A-Cath. Electronically Signed   By: Anner Crete M.D.   On: 02/27/2016 04:48   Dg Chest Port 1 View  Result Date: 02/26/2016 CLINICAL DATA:  Respiratory failure. EXAM: PORTABLE CHEST 1 VIEW COMPARISON:  CT 02/15/2016.  Chest x-ray 02/28/2016. FINDINGS: PowerPort catheter stable position. Persistent right apical pleural-parenchymal thickening/mass noted. Persistent infiltrate in the right mid lung. Persistent diffuse interstitial prominence without interval change. Heart size stable. No prominent pleural effusion or pneumothorax . IMPRESSION: 1. PowerPort catheter in stable position. Persistent right apical pleural-parenchymal thickening/ mass without interval change. 2. Persistent unchanged right mid lung alveolar infiltrate. Persistent unchanged diffuse interstitial prominence. Electronically Signed   By: Marcello Moores  Register   On: 02/26/2016 07:31       --Marda Stalker, MD.  ICU Pager: [phone]  Pulmonary and Critical Care Office Number: [phone]  Patricia Pesa, M.D.  Vilinda Boehringer, M.D.  Merton Border, M.D  02/27/2016   Critical Care Attestation.  I have personally obtained a history, examined the patient, evaluated laboratory and imaging results, formulated the assessment and plan and placed orders. The Patient requires high complexity decision making for assessment and support, frequent evaluation and titration of therapies, application of advanced monitoring technologies and extensive interpretation of multiple databases. The patient has critical illness that could lead imminently to failure of 1 or more organ systems and requires the highest level of physician preparedness to intervene.  Critical Care Time devoted to patient care services described in this note is 35 minutes and is exclusive of time spent in procedures.

## 2016-02-27 NOTE — Progress Notes (Signed)
Pharmacy Progress Note  Sean Hall is a 65 y.o. male admitted on 02/18/2016 with pneumonia.  Pharmacy has been consulted for cefepime dosing   Plan: 1. Antibiotics: Continue cefepime 2g IV Q8hr.    2. Electrolytes No further electrolyte supplementation warranted for now.     Height: '6\' 2"'$  (188 cm) Weight: 158 lb 11.7 oz (72 kg) IBW/kg (Calculated) : 82.2  Temp (24hrs), Avg:97.8 F (36.6 C), Min:97.6 F (36.4 C), Max:98.1 F (36.7 C)   Recent Labs Lab 02/19/2016 2054 02/25/16 1035 02/26/16 0411 02/27/16 0503  WBC 44.3* 51.7* 42.4* 24.1*  CREATININE 1.07 1.09 1.16 1.09    Estimated Creatinine Clearance: 69.7 mL/min (by C-G formula based on SCr of 1.09 mg/dL).    Allergies  Allergen Reactions  . Aleve [Naproxen Sodium] Hives, Shortness Of Breath and Swelling    Naproxin    Antimicrobials this admission: vancomycin  10/18 >> 10/19 levofloxacin 10/18 >> 10/18 Cefepime 10/19 >>  Dose adjustments this admission: N/A  Microbiology results: 10/18 BCx: BCID - no organism detected.  10/19 MRSA PCR: negative   Pharmacy will continue to monitor and adjust per consult.    Sean Hall D 02/27/2016 8:59 AM

## 2016-02-27 NOTE — Progress Notes (Signed)
ANTICOAGULATION CONSULT NOTE - Initial Consult  Pharmacy Consult for heparin drip Indication: ACS/STEMI/NSTEMI  Allergies  Allergen Reactions  . Aleve [Naproxen Sodium] Hives, Shortness Of Breath and Swelling    Naproxin    Patient Measurements: Height: '6\' 2"'$  (188 cm) Weight: 158 lb 11.7 oz (72 kg) IBW/kg (Calculated) : 82.2 Heparin Dosing Weight: 75kg  Vital Signs: Temp: 98 F (36.7 C) (10/21 1200) Temp Source: Axillary (10/21 1200) BP: 126/83 (10/21 1200) Pulse Rate: 114 (10/21 1200)  Labs:  Recent Labs  02/25/16 0413  02/25/16 0644 02/25/16 1035 02/25/16 1433  02/26/16 0411 02/26/16 1151 02/26/16 2155 02/27/16 0503 02/27/16 1126  HGB  --   --   --  13.0  --   --  11.9*  --   --  10.3*  --   HCT  --   --   --  38.8*  --   --  36.0*  --   --  30.4*  --   PLT  --   --   --  89*  --   --  98*  --   --  93*  --   APTT  --   < > 24  --  66*  < > 53* 58* 76* 102* 69*  LABPROT  --   --  16.9*  --   --   --   --   --   --   --   --   INR  --   --  1.36  --   --   --   --   --   --   --   --   HEPARINUNFRC  --   --  0.82*  --   --   < > 0.84* 0.82*  --  1.12*  --   CREATININE  --   --   --  1.09  --   --  1.16  --   --  1.09  --   TROPONINI 4.15*  --   --  12.34* 19.65*  --   --   --   --   --   --   < > = values in this interval not displayed.  Estimated Creatinine Clearance: 69.7 mL/min (by C-G formula based on SCr of 1.09 mg/dL).   Medical History: Past Medical History:  Diagnosis Date  . Arthritis   . Atrial fibrillation ([place])   . Cancer ([place]) 2017   Lung  . CHF (congestive heart failure) ([place])   . Chronic kidney disease   . COPD (chronic obstructive pulmonary disease) ([place])   . Disease of lung   . DVT (deep venous thrombosis) ([facility])    left leg  . Dysrhythmia   . Headache   . History of chemotherapy   . History of radiation therapy   . Hypertension   . Pneumonia     Medications:  Pharmacy consulted to dose and monitor heparin drip in this 65 year  old male admitted with ACS/NSTEMI. On apixaban as outpatient so will need to dose off of aPTT until aPTT and HL coincide. Patient currently ordered heparin drip at 1100 units/hr.   Assessment:  Goal of Therapy:  Heparin level 0.3-0.7 units/ml  APTT 66 - 102 seconds Monitor platelets by anticoagulation protocol: Yes   Plan:  Will increase heparin drip rate to 1300 units/hr and recheck aPTT 6 hrs after rate change.  Will recheck anti-Xa on 10/21 with AM labs  10/20 22:00 aPTT 76. Continue current regimen.  Recheck aPTT with rest of AM labs.  10/21 0503 APTT 102. Continue current regimen. Recheck APTT in 6 hours @ 1200. Heparin level recheck with am labs.   10/22 11:30 APTT 69. Continue current rate. Recheck APTT @ 1800.    Pharmacy will continue to monitor and adjust per consult.    Joniyah Mallinger D 02/27/2016,12:30 PM

## 2016-02-27 NOTE — Progress Notes (Addendum)
Pt remains unable to tolerate minimal movement, talking, or eating w/o experiencing extreme dyspnea and oxygen sats dropping.  Requiring Hi Flo at 100% + NRB at maximum flow to be comfortable and rest.  Encouraging him to turn minimally as much as he can but Probation officer is not actively turning him to minimize stress to his respiratory status.  Patient also does not want to take laxative/stool softeners.   Have encouraged him to not feel embarrassed, but he states he feels like he will have BM soon. No BM so far this shift though.

## 2016-02-27 NOTE — Progress Notes (Signed)
ANTICOAGULATION CONSULT NOTE - Initial Consult  Pharmacy Consult for heparin drip Indication: ACS/STEMI/NSTEMI  Allergies  Allergen Reactions  . Aleve [Naproxen Sodium] Hives, Shortness Of Breath and Swelling    Naproxin    Patient Measurements: Height: '6\' 2"'$  (188 cm) Weight: 158 lb 11.7 oz (72 kg) IBW/kg (Calculated) : 82.2 Heparin Dosing Weight: 75kg  Vital Signs: Temp: 97.6 F (36.4 C) (10/21 1600) Temp Source: Axillary (10/21 1600) BP: 137/81 (10/21 1800) Pulse Rate: 120 (10/21 1800)  Labs:  Recent Labs  02/25/16 0413  02/25/16 0644 02/25/16 1035 02/25/16 1433  02/26/16 0411 02/26/16 1151  02/27/16 0503 02/27/16 1126 02/27/16 1810  HGB  --   --   --  13.0  --   --  11.9*  --   --  10.3*  --   --   HCT  --   --   --  38.8*  --   --  36.0*  --   --  30.4*  --   --   PLT  --   --   --  89*  --   --  98*  --   --  93*  --   --   APTT  --   < > 24  --  66*  < > 53* 58*  < > 102* 69* 65*  LABPROT  --   --  16.9*  --   --   --   --   --   --   --   --   --   INR  --   --  1.36  --   --   --   --   --   --   --   --   --   HEPARINUNFRC  --   --  0.82*  --   --   < > 0.84* 0.82*  --  1.12*  --  0.75*  CREATININE  --   --   --  1.09  --   --  1.16  --   --  1.09  --   --   TROPONINI 4.15*  --   --  12.34* 19.65*  --   --   --   --   --   --   --   < > = values in this interval not displayed.  Estimated Creatinine Clearance: 69.7 mL/min (by C-G formula based on SCr of 1.09 mg/dL).   Medical History: Past Medical History:  Diagnosis Date  . Arthritis   . Atrial fibrillation ([place])   . Cancer ([place]) 2017   Lung  . CHF (congestive heart failure) ([place])   . Chronic kidney disease   . COPD (chronic obstructive pulmonary disease) ([place])   . Disease of lung   . DVT (deep venous thrombosis) ([facility])    left leg  . Dysrhythmia   . Headache   . History of chemotherapy   . History of radiation therapy   . Hypertension   . Pneumonia     Medications:  Pharmacy consulted  to dose and monitor heparin drip in this 65 year old male admitted with ACS/NSTEMI. On apixaban as outpatient so will need to dose off of aPTT until aPTT and HL coincide. Patient currently ordered heparin drip at 1100 units/hr.   Assessment:  Goal of Therapy:  Heparin level 0.3-0.7 units/ml  APTT 66 - 102 seconds Monitor platelets by anticoagulation protocol: Yes   Plan:  Will increase heparin drip rate to 1300  units/hr and recheck aPTT 6 hrs after rate change.  Will recheck anti-Xa on 10/21 with AM labs  10/20 22:00 aPTT 76. Continue current regimen. Recheck aPTT with rest of AM labs.  10/21 0503 APTT 102. Continue current regimen. Recheck APTT in 6 hours @ 1200. Heparin level recheck with am labs.   10/21 11:30 APTT 69. Continue current rate. Recheck APTT @ 1800.   10/21 18:00 aPTT 65,  HL = 0.75 Will increase heparin drip rate to 1400 units/hr and recheck aPTT 6 hrs after rate change. Will recheck HL on 10/22 with AM labs.    Pharmacy will continue to monitor and adjust per consult.    Jakayla Schweppe D 02/27/2016,7:33 PM

## 2016-02-27 NOTE — Progress Notes (Signed)
SUBJECTIVE: Patient denies any chest pain but is very short of breath.   Vitals:   02/27/16 0444 02/27/16 0500 02/27/16 0800 02/27/16 0828  BP:  128/83 (!) 146/101   Pulse:  (!) 106 (!) 112   Resp:  15 (!) 24   Temp:    97.6 F (36.4 C)  TempSrc:      SpO2:  98% 99% (!) 88%  Weight: 158 lb 11.7 oz (72 kg)     Height:        Intake/Output Summary (Last 24 hours) at 02/27/16 1049 Last data filed at 02/27/16 0900  Gross per 24 hour  Intake           941.43 ml  Output             1425 ml  Net          -483.57 ml    LABS: Basic Metabolic Panel:  Recent Labs  02/26/16 0411 02/27/16 0503  NA 133* 133*  K 4.1 3.5  CL 95* 94*  CO2 27 30  GLUCOSE 172* 165*  BUN 49* 49*  CREATININE 1.16 1.09  CALCIUM 8.4* 8.4*   Liver Function Tests:  Recent Labs  02/23/2016 2054  AST 51*  ALT 51  ALKPHOS 90  BILITOT 0.8  PROT 6.7  ALBUMIN 3.3*   No results for input(s): LIPASE, AMYLASE in the last 72 hours. CBC:  Recent Labs  02/26/16 0411 02/27/16 0503  WBC 42.4* 24.1*  NEUTROABS 40.7*  --   HGB 11.9* 10.3*  HCT 36.0* 30.4*  MCV 87.2 86.9  PLT 98* 93*   Cardiac Enzymes:  Recent Labs  02/25/16 0413 02/25/16 1035 02/25/16 1433  TROPONINI 4.15* 12.34* 19.65*   BNP: Invalid input(s): POCBNP D-Dimer: No results for input(s): DDIMER in the last 72 hours. Hemoglobin A1C: No results for input(s): HGBA1C in the last 72 hours. Fasting Lipid Panel: No results for input(s): CHOL, HDL, LDLCALC, TRIG, CHOLHDL, LDLDIRECT in the last 72 hours. Thyroid Function Tests: No results for input(s): TSH, T4TOTAL, T3FREE, THYROIDAB in the last 72 hours.  Invalid input(s): FREET3 Anemia Panel: No results for input(s): VITAMINB12, FOLATE, FERRITIN, TIBC, IRON, RETICCTPCT in the last 72 hours.   PHYSICAL EXAM General: Well developed, well nourished, in no acute distress HEENT:  Normocephalic and atramatic Neck:  No JVD.  Lungs: Clear bilaterally to auscultation and  percussion. Heart: HRRR . Normal S1 and S2 without gallops or murmurs.  Abdomen: Bowel sounds are positive, abdomen soft and non-tender  Msk:  Back normal, normal gait. Normal strength and tone for age. Extremities: No clubbing, cyanosis or edema.   Neuro: Alert and oriented X 3. Psych:  Good affect, responds appropriately  TELEMETRY:Sinus tachycardia about 1 30 bpm  ASSESSMENT AND PLAN: Status post inferior wall myocardial infarction being conservatively treated as is DO NOT RESUSCITATE and did not want to have cardiac catheterization. Patient has history of metastatic lung cancer to the brain and does not want to have invasive procedures and hospice been following him.  Principal Problem:   Sepsis ([place]) Active Problems:   Congestive heart failure ([facility])   Chronic obstructive pulmonary disease ([facility])   BP (high blood pressure)   Malignant neoplasm of right upper lobe of lung ([place])   HCAP (healthcare-associated pneumonia)   Pulmonary embolism ([place])   DNR (do not resuscitate)   Palliative care by specialist   Acute pulmonary edema ([place])   Acute respiratory failure ([place])   Hypoxia    Khanh Cordner A,  MD, [facility] 02/27/2016 10:49 AM

## 2016-02-27 NOTE — Progress Notes (Signed)
Patient ID: Sean Hall, male   DOB: 04/19/1951, 65 y.o.   MRN: [id number]  [facility] PROGRESS NOTE  Henson Fraticelli PJK:[id number] DOB: 1950/11/08 DOA: 02/17/2016 PCP: Lloyd Huger, MD  HPI/Subjective: Patient is short of breath. No chest pain. Some cough but dry. Patient states he can't see well out of his left eye. Tunnel vision in the left eye  Objective: Vitals:   02/27/16 1100 02/27/16 1200  BP: 125/88 126/83  Pulse: (!) 124 (!) 114  Resp: (!) 21 17  Temp:  98 F (36.7 C)    Filed Weights   02/09/2016 2050 02/25/16 0225 02/27/16 0444  Weight: 73.5 kg (162 lb) 75.4 kg (166 lb 3.6 oz) 72 kg (158 lb 11.7 oz)    ROS: Review of Systems  Constitutional: Negative for chills and fever.  Eyes: Positive for blurred vision. Negative for pain.  Respiratory: Positive for cough, shortness of breath and wheezing.   Cardiovascular: Negative for chest pain.  Gastrointestinal: Negative for abdominal pain, constipation, diarrhea, nausea and vomiting.  Genitourinary: Negative for dysuria.  Musculoskeletal: Negative for joint pain.  Neurological: Negative for dizziness and headaches.   Exam: Physical Exam  Constitutional: He is oriented to person, place, and time.  HENT:  Nose: No mucosal edema.  Mouth/Throat: No oropharyngeal exudate or posterior oropharyngeal edema.  Eyes: Conjunctivae, EOM and lids are normal. Pupils are equal, round, and reactive to light.  Neck: No JVD present. Carotid bruit is not present. No edema present. No thyroid mass and no thyromegaly present.  Cardiovascular: S1 normal and S2 normal.  Tachycardia present.  Exam reveals no gallop.   Murmur heard.  Systolic murmur is present with a grade of 3/6  Pulses:      Dorsalis pedis pulses are 2+ on the right side, and 2+ on the left side.  Respiratory: Accessory muscle usage present. He is in respiratory distress. He has decreased breath sounds in the right middle field, the right lower field, the left  middle field and the left lower field. He has no wheezes. He has rhonchi in the right lower field and the left lower field. He has no rales.  GI: Soft. Bowel sounds are normal. There is no tenderness.  Musculoskeletal:       Right ankle: He exhibits swelling.       Left ankle: He exhibits swelling.  Lymphadenopathy:    He has no cervical adenopathy.  Neurological: He is alert and oriented to person, place, and time. No cranial nerve deficit.  Skin: Skin is warm. No rash noted.  Psychiatric: He has a normal mood and affect.      Data Reviewed: Basic Metabolic Panel:  Recent Labs Lab 03/06/2016 2054 02/25/16 1035 02/26/16 0411 02/27/16 0503  NA 132* 134* 133* 133*  K 4.0 4.2 4.1 3.5  CL 96* 97* 95* 94*  CO2 '24 24 27 30  '$ GLUCOSE 199* 175* 172* 165*  BUN 34* 40* 49* 49*  CREATININE 1.07 1.09 1.16 1.09  CALCIUM 8.8* 8.6* 8.4* 8.4*   Liver Function Tests:  Recent Labs Lab 02/27/2016 2054  AST 51*  ALT 51  ALKPHOS 90  BILITOT 0.8  PROT 6.7  ALBUMIN 3.3*   CBC:  Recent Labs Lab 02/23/2016 2054 02/25/16 1035 02/26/16 0411 02/27/16 0503  WBC 44.3* 51.7* 42.4* 24.1*  NEUTROABS  --   --  40.7*  --   HGB 12.8* 13.0 11.9* 10.3*  HCT 39.2* 38.8* 36.0* 30.4*  MCV 87.7 86.3 87.2 86.9  PLT 103*  89* 98* 93*   Cardiac Enzymes:  Recent Labs Lab 02/17/2016 2054 02/25/16 0413 02/25/16 1035 02/25/16 1433  TROPONINI 0.52* 4.15* 12.34* 19.65*    CBG:  Recent Labs Lab 02/25/16 0227 02/25/16 0910 02/25/16 1532 02/25/16 2133  GLUCAP 206* 171* 174* 174*    Recent Results (from the past 240 hour(s))  Blood culture (routine x 2)     Status: None (Preliminary result)   Collection Time: 02/25/2016 10:56 PM  Result Value Ref Range Status   Specimen Description BLOOD  RIGHT AC  Final   Special Requests   Final    BOTTLES DRAWN AEROBIC AND ANAEROBIC  ANA 13ML AER 14ML   Culture  Setup Time Organism ID to follow  Final   Culture NO GROWTH 2 DAYS  Final   Report Status  PENDING  Incomplete  Blood culture (routine x 2)     Status: None (Preliminary result)   Collection Time: 03/02/2016 10:56 PM  Result Value Ref Range Status   Specimen Description BLOOD  LEFT AC  Final   Special Requests   Final    BOTTLES DRAWN AEROBIC AND ANAEROBIC  ANA 4ML AER 4ML   Culture NO GROWTH 2 DAYS  Final   Report Status PENDING  Incomplete  Blood Culture ID Panel (Reflexed)     Status: None   Collection Time: 02/22/2016 10:56 PM  Result Value Ref Range Status   Enterococcus species NOT DETECTED NOT DETECTED Final   Listeria monocytogenes NOT DETECTED NOT DETECTED Final   Staphylococcus species NOT DETECTED NOT DETECTED Final   Staphylococcus aureus NOT DETECTED NOT DETECTED Final   Streptococcus species NOT DETECTED NOT DETECTED Final   Streptococcus agalactiae NOT DETECTED NOT DETECTED Final   Streptococcus pneumoniae NOT DETECTED NOT DETECTED Final   Streptococcus pyogenes NOT DETECTED NOT DETECTED Final   Acinetobacter baumannii NOT DETECTED NOT DETECTED Final   Enterobacteriaceae species NOT DETECTED NOT DETECTED Final   Enterobacter cloacae complex NOT DETECTED NOT DETECTED Final   Escherichia coli NOT DETECTED NOT DETECTED Final   Klebsiella oxytoca NOT DETECTED NOT DETECTED Final   Klebsiella pneumoniae NOT DETECTED NOT DETECTED Final   Proteus species NOT DETECTED NOT DETECTED Final   Serratia marcescens NOT DETECTED NOT DETECTED Final   Haemophilus influenzae NOT DETECTED NOT DETECTED Final   Neisseria meningitidis NOT DETECTED NOT DETECTED Final   Pseudomonas aeruginosa NOT DETECTED NOT DETECTED Final   Candida albicans NOT DETECTED NOT DETECTED Final   Candida glabrata NOT DETECTED NOT DETECTED Final   Candida krusei NOT DETECTED NOT DETECTED Final   Candida parapsilosis NOT DETECTED NOT DETECTED Final   Candida tropicalis NOT DETECTED NOT DETECTED Final  MRSA PCR Screening     Status: None   Collection Time: 02/25/16  2:38 AM  Result Value Ref Range Status    MRSA by PCR NEGATIVE NEGATIVE Final    Comment:        The GeneXpert MRSA Assay (FDA approved for NASAL specimens only), is one component of a comprehensive MRSA colonization surveillance program. It is not intended to diagnose MRSA infection nor to guide or monitor treatment for MRSA infections.      Studies: Dg Chest 1 View  Result Date: 02/27/2016 CLINICAL DATA:  65 year old male with shortness of breath EXAM: CHEST 1 VIEW COMPARISON:  Chest radiograph dated 02/26/2016 FINDINGS: Left pectoral Port-A-Cath with tip in stable positioning likely at the junction of the SVC/left innominate vein. Right apical pleural thickening and masslike opacity similar to prior  radiograph. There is persistent diffuse interstitial prominence similar to the prior radiograph. Stable cardiac silhouette. No pleural effusion or pneumothorax. No acute osseous pathology. IMPRESSION: No significant interval change in the right apical opacity and pleural parenchymal densities. No pneumothorax. Stable positioning of the left pectoral Port-A-Cath. Electronically Signed   By: Anner Crete M.D.   On: 02/27/2016 04:48   Dg Chest Port 1 View  Result Date: 02/26/2016 CLINICAL DATA:  Respiratory failure. EXAM: PORTABLE CHEST 1 VIEW COMPARISON:  CT 02/13/2016.  Chest x-ray 02/08/2016. FINDINGS: PowerPort catheter stable position. Persistent right apical pleural-parenchymal thickening/mass noted. Persistent infiltrate in the right mid lung. Persistent diffuse interstitial prominence without interval change. Heart size stable. No prominent pleural effusion or pneumothorax . IMPRESSION: 1. PowerPort catheter in stable position. Persistent right apical pleural-parenchymal thickening/ mass without interval change. 2. Persistent unchanged right mid lung alveolar infiltrate. Persistent unchanged diffuse interstitial prominence. Electronically Signed   By: Marcello Moores  Register   On: 02/26/2016 07:31    Scheduled Meds: .  budesonide (PULMICORT) nebulizer solution  0.5 mg Nebulization BID  . ceFEPIme  2 g Intravenous Q8H  . famotidine  20 mg Oral Daily  . fentaNYL  12.5 mcg Transdermal Q72H  . insulin aspart  0-15 Units Subcutaneous TID WC  . insulin aspart  0-5 Units Subcutaneous QHS  . ipratropium-albuterol  3 mL Nebulization Q4H  . mouth rinse  15 mL Mouth Rinse BID  . methylPREDNISolone (SOLU-MEDROL) injection  40 mg Intravenous Q12H  . nitroGLYCERIN  0.5 inch Topical Q6H  . senna-docusate  2 tablet Oral BID  . sodium chloride flush  3 mL Intravenous Q12H   Continuous Infusions: . heparin 1,300 Units/hr (02/27/16 1200)    Assessment/Plan:  1. Clinical sepsis with healthcare associated pneumonia. Patient on cefepime. 2. Acute on chronic respiratory failure with hypoxia. Patient is on 100% high flow nasal cannula and 100% nonrebreather. I have never seen this before. Overall prognosis is poor. Patient will be here in the hospital while on 100% high flow nasal cannula. Patient is a DO NOT RESUSCITATE. Case discussed with critical care specialist. 3. NSTEMI- on IV heparin drip. Refused cardiac catheter. And low-dose beta blocker if possible. 4. Metastatic lung cancer to brain. Overall prognosis poor. 5. 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 Chronic pain on fentanyl patch 7. Pulmonary embolism on heparin drip  Overall prognosis is poor. Patient will be stuck here in the hospital while on high flow nasal cannula 100%.  Code Status:     Code Status Orders        Start     Ordered   02/25/16 0229  Do not attempt resuscitation (DNR)  Continuous    Question Answer Comment  In the event of cardiac or respiratory ARREST Do not call a "code blue"   In the event of cardiac or respiratory ARREST Do not perform Intubation, CPR, defibrillation or ACLS   In the event of cardiac or respiratory ARREST Use medication by any route, position, wound care, and other measures to relive pain  and suffering. May use oxygen, suction and manual treatment of airway obstruction as needed for comfort.      02/25/16 0228    Code Status History    Date Active Date Inactive Code Status Order ID Comments User Context   02/12/2016  6:07 PM 02/15/2016  5:48 PM DNR [id number]  Hillary Bow, MD ED   02/06/2016  6:22 PM 02/08/2016  4:20 PM Full Code [id number]  Sital  Benjie Karvonen, MD Inpatient   11/11/2015  4:29 PM 11/12/2015  5:56 PM Full Code [id number]  Katha Cabal, MD Inpatient   11/07/2015 11:31 PM 11/09/2015  6:55 PM Full Code [id number]  Lance Coon, MD Inpatient    Advance Directive Documentation   Flowsheet Row Most Recent Value  Type of Advance Directive  Healthcare Power of Attorney, Living will  Pre-existing out of facility DNR order (yellow form or pink MOST form)  No data  "MOST" Form in Place?  No data     Family Communication: Spoke with family at the bedside but his pastor is the healthcare power of attorney Disposition Plan: To be determined  Consultants:  Cardiology  Critical care specialist  Antibiotics:  Cefepime  Time spent: 32 minutes. Patient is critically ill and high likelihood of cardiopulmonary arrest.  [place], [place]

## 2016-02-27 NOTE — Progress Notes (Signed)
Patient instructed re allowing nursing to empty urinal so we can measure.  Per him, he has urinated several times this AM but family has emptied each time.

## 2016-02-28 DIAGNOSIS — R0902 Hypoxemia: Secondary | ICD-10-CM

## 2016-02-28 LAB — HEPARIN LEVEL (UNFRACTIONATED): HEPARIN UNFRACTIONATED: 0.84 [IU]/mL — AB (ref 0.30–0.70)

## 2016-02-28 LAB — CBC
HCT: 28.8 % — ABNORMAL LOW (ref 40.0–52.0)
Hemoglobin: 9.8 g/dL — ABNORMAL LOW (ref 13.0–18.0)
MCH: 29.4 pg (ref 26.0–34.0)
MCHC: 33.9 g/dL (ref 32.0–36.0)
MCV: 86.9 fL (ref 80.0–100.0)
Platelets: 119 10*3/uL — ABNORMAL LOW (ref 150–440)
RBC: 3.32 MIL/uL — ABNORMAL LOW (ref 4.40–5.90)
RDW: 22.2 % — AB (ref 11.5–14.5)
WBC: 29.3 10*3/uL — ABNORMAL HIGH (ref 3.8–10.6)

## 2016-02-28 LAB — APTT: APTT: 68 s — AB (ref 24–36)

## 2016-02-28 LAB — PROCALCITONIN: PROCALCITONIN: 0.85 ng/mL

## 2016-02-28 MED ORDER — MORPHINE SULFATE (PF) 2 MG/ML IV SOLN
2.0000 mg | INTRAVENOUS | Status: DC | PRN
  Administered 2016-02-28 – 2016-02-29 (×5): 2 mg via INTRAVENOUS
  Filled 2016-02-28 (×6): qty 1

## 2016-02-28 MED ORDER — METHYLPREDNISOLONE SODIUM SUCC 40 MG IJ SOLR
40.0000 mg | INTRAMUSCULAR | Status: DC
  Administered 2016-02-29: 40 mg via INTRAVENOUS
  Filled 2016-02-28: qty 1

## 2016-02-28 MED ORDER — METOPROLOL TARTRATE 5 MG/5ML IV SOLN
2.5000 mg | Freq: Four times a day (QID) | INTRAVENOUS | Status: DC | PRN
  Administered 2016-02-29: 2.5 mg via INTRAVENOUS
  Filled 2016-02-28: qty 5

## 2016-02-28 NOTE — Progress Notes (Signed)
Per Dr Anselm Jungling it is ok to change morphine interval to [facility]

## 2016-02-28 NOTE — Progress Notes (Signed)
ANTICOAGULATION CONSULT NOTE - Initial Consult  Pharmacy Consult for heparin drip Indication: ACS/STEMI/NSTEMI  Allergies  Allergen Reactions  . Aleve [Naproxen Sodium] Hives, Shortness Of Breath and Swelling    Naproxin    Patient Measurements: Height: '6\' 2"'$  (188 cm) Weight: 158 lb 11.7 oz (72 kg) IBW/kg (Calculated) : 82.2 Heparin Dosing Weight: 75kg  Vital Signs: Temp: 97.7 F (36.5 C) (10/21 2000) Temp Source: Axillary (10/21 2000) BP: 130/79 (10/22 0200) Pulse Rate: 96 (10/22 0200)  Labs:  Recent Labs  02/25/16 0413  02/25/16 0644  02/25/16 1035 02/25/16 1433  02/26/16 0411  02/27/16 0503 02/27/16 1126 02/27/16 1810 02/28/16 0142  HGB  --   --   --   < > 13.0  --   --  11.9*  --  10.3*  --   --  9.8*  HCT  --   --   --   < > 38.8*  --   --  36.0*  --  30.4*  --   --  28.8*  PLT  --   --   --   < > 89*  --   --  98*  --  93*  --   --  119*  APTT  --   < > 24  --   --  66*  < > 53*  < > 102* 69* 65* 68*  LABPROT  --   --  16.9*  --   --   --   --   --   --   --   --   --   --   INR  --   --  1.36  --   --   --   --   --   --   --   --   --   --   HEPARINUNFRC  --   --  0.82*  --   --   --   < > 0.84*  < > 1.12*  --  0.75* 0.84*  CREATININE  --   --   --   --  1.09  --   --  1.16  --  1.09  --   --   --   TROPONINI 4.15*  --   --   --  12.34* 19.65*  --   --   --   --   --   --   --   < > = values in this interval not displayed.  Estimated Creatinine Clearance: 69.7 mL/min (by C-G formula based on SCr of 1.09 mg/dL).   Medical History: Past Medical History:  Diagnosis Date  . Arthritis   . Atrial fibrillation ([place])   . Cancer ([place]) 2017   Lung  . CHF (congestive heart failure) ([place])   . Chronic kidney disease   . COPD (chronic obstructive pulmonary disease) ([place])   . Disease of lung   . DVT (deep venous thrombosis) ([facility])    left leg  . Dysrhythmia   . Headache   . History of chemotherapy   . History of radiation therapy   . Hypertension   .  Pneumonia     Medications:  Pharmacy consulted to dose and monitor heparin drip in this 65 year old male admitted with ACS/NSTEMI. On apixaban as outpatient so will need to dose off of aPTT until aPTT and HL coincide. Patient currently ordered heparin drip at 1100 units/hr.   Assessment:  Goal of Therapy:  Heparin level 0.3-0.7 units/ml  APTT 66 - 102 seconds Monitor platelets by anticoagulation protocol: Yes   Plan:  Will increase heparin drip rate to 1300 units/hr and recheck aPTT 6 hrs after rate change.  Will recheck anti-Xa on 10/21 with AM labs  10/20 22:00 aPTT 76. Continue current regimen. Recheck aPTT with rest of AM labs.  10/21 0503 APTT 102. Continue current regimen. Recheck APTT in 6 hours @ 1200. Heparin level recheck with am labs.   10/21 11:30 APTT 69. Continue current rate. Recheck APTT @ 1800.   10/21 18:00 aPTT 65,  HL = 0.75 Will increase heparin drip rate to 1400 units/hr and recheck aPTT 6 hrs after rate change. Will recheck HL on 10/22 with AM labs.   10/22 01:30 aPTT 68, heparin level 0.84. Continue current regimen. Recheck aPTT, CBC and heparin level with tomorrow AM labs.   Pharmacy will continue to monitor and adjust per consult.    [name] S 02/28/2016,2:52 AM

## 2016-02-28 NOTE — Progress Notes (Addendum)
ADVANCE CARE PLANNING NOTE:   Discussed case with pt and pt's wife, sister-in-law and Sean Hall who is his medical power of attorney. I explained the patient has experienced a progressive downward course over the past several days; he is now on the max amount of support despite interventions, and his lung disease has continued to progress.    A further downward course is predictable and likely, therefore I would recommend change to comfort care measures only.  Sean Hall would like to wait on further decisions until he can discuss with the rest of the family later today. Will wait to hear back from medical power of attorney.   ADDENDUM:  Pt's medical POA, Sean Hall [phone]) had discussions with patient and family. Pt is understanding of condition, and is about ready to transition to comfort measures only. He is having a good and lucid day today, and would like to speak with other family members today "tie up some loose ends". Therefore would like to delay transition to comfort measures only until tomorrow. We will continue current measures, but hold on unnecessary lab work, etc.    Sean Hall, M.D.  Time spent in discussion 30 min.

## 2016-02-28 NOTE — Progress Notes (Signed)
Patient has declined bipap for the night. He states that breathing is easier on HFNC and NRB combo. States bipap causes his face to hurt.  Saturation noted in mid 90s

## 2016-02-28 NOTE — Progress Notes (Signed)
[facility] Progess Note    ASSESSMENT/PLAN   65 year old male lung cancer, metastatic to the brain, now presents with acute hypoxic respiratory failure with acute pulmonary edema secondary to acute MI. The patient has opted not to undergo cardiac catheterization, he is DO NOT RESUSCITATE.  ASSESSMENT / PLAN:  PULMONARY A: Acute on chronic hypoxic respiratory failure secondary to HCAP vs Pulmonary Edema, AECOPD, LLL PE and Malignant Neoplasm of RLL Continues to decline, is now on 100% high-flow and 100% NRB.  He appears to have progressive, end stage respiratory failure, he has shown no signs of improvement. He remains awake and alert despite his illness.  P: Will discuss with family; pt needs to be transitioned to comfort measures.  Discontinue steroids, do not appear to be helping.   CARDIOVASCULAR A:  Hypertension Elevated troponin's Hx: Atrial Fibrillation and Deep Venous Thrombosis P:  Heparin gtt Trend troponin's Pt declined cardiac cath at this time   RENAL A:   Acute kidney injury, slight improvement.  P:   Trend BMP's Replace electrolytes as indicated Monitor UOP  GASTROINTESTINAL A:   No acute issues P:   Advance diet.   HEMATOLOGIC A:   Anemia-improving Malignant neoplasm RLL with mets to the brain Leukocytosis  P:  Heparin gtt dosing per pharmacy Trend CBC's Monitor for s/sx of bleeding Transfuse for hgb <7 --On brain irradiation, currently being held.   INFECTIOUS A:   HCAP P:   Trend WBC's and monitor fever curve Trend PCT's Continue abx as listed above Follow cultures Flu panel pending   CULTURES: Blood x2 10/18>> negative thus far.  MRSA screen 10/19>> negative.   ANTIBIOTICS: Cefepime 10/18>> Vancomycin 10/18>>10/20  LINES/TUBES: PIV's x2 10/18>> Left chest portacath>>   ENDOCRINE A:   Hyperglycemia P:   SSI CBG's q6hrs  NEUROLOGIC A:   Malignant neoplasm RLL with mets to the  brain Chronic pain P:   Continue oxycodone and transdermal fentanyl for pain management Lights on during the day Promote family presence at bedside   Best Practices  DVT Prophylaxis: on heparin.  GI Prophylaxis: famotidine.  SIGNIFICANT EVENTS: 10/18-Pt admitted to [facility] telemetry unit due to acute on chronic hypoxic respiratory failure 10/18-Pt transferred to ICU due to worsening acute on chronic hypoxic respiratory failure requiring bipap PCCM consulted for further management   ---------------------------------------   ----------------------------------------   Name: Sean Hall MRN: [id number] DOB: Sep 14, 1950    ADMISSION DATE:  03/05/2016    SUBJECTIVE:   Patient is awake and alert, he is in good spirits,  he is conversational. He got married on 10/20 in the ICU.   Review of Systems:  Constitutional: Feels well. Cardiovascular: No chest pain.  Pulmonary: Denies dyspnea.   The remainder of systems were reviewed and were found to be negative other than what is documented in the HPI.    VITAL SIGNS: Temp:  [97.6 F (36.4 C)-98 F (36.7 C)] 97.7 F (36.5 C) (10/22 0723) Pulse Rate:  [67-130] 106 (10/22 0700) Resp:  [13-29] 21 (10/22 0700) BP: (119-153)/(73-93) 144/91 (10/22 0700) SpO2:  [82 %-98 %] 90 % (10/22 0723) FiO2 (%):  [100 %] 100 % (10/22 0723) Weight:  [160 lb 7.9 oz (72.8 kg)] 160 lb 7.9 oz (72.8 kg) (10/22 0500) HEMODYNAMICS:   VENTILATOR SETTINGS: FiO2 (%):  [100 %] 100 % INTAKE / OUTPUT:  Intake/Output Summary (Last 24 hours) at 02/28/16 0805 Last data filed at 02/28/16 0700  Gross per 24 hour  Intake  1161.43 ml  Output              925 ml  Net           236.43 ml    PHYSICAL EXAMINATION: Physical Examination:   VS: BP (!) 144/91 (BP Location: Left Arm)   Pulse (!) 106   Temp 97.7 F (36.5 C) (Axillary)   Resp (!) 21   Ht '6\' 2"'$  (1.88 m)   Wt 160 lb 7.9 oz (72.8 kg)   SpO2 90%   BMI 20.61 kg/m   General Appearance:  No distress  Neuro:without focal findings, mental status normal. HEENT: PERRLA, EOM intact. Pulmonary: Bilateral diffuse crackles  CardiovascularNormal S1,S2.  No m/r/g.   Abdomen: Benign, Soft, non-tender. Renal:  No costovertebral tenderness  GU:  Not performed at this time. Endocrine: No evident thyromegaly. Skin:   warm, no rashes, no ecchymosis  Extremities: normal, no cyanosis, clubbing.   LABS:   LABORATORY PANEL:   CBC  Recent Labs Lab 02/28/16 0142  WBC 29.3*  HGB 9.8*  HCT 28.8*  PLT 119*    Chemistries   Recent Labs Lab 02/13/2016 2054  02/27/16 0503  NA 132*  < > 133*  K 4.0  < > 3.5  CL 96*  < > 94*  CO2 24  < > 30  GLUCOSE 199*  < > 165*  BUN 34*  < > 49*  CREATININE 1.07  < > 1.09  CALCIUM 8.8*  < > 8.4*  AST 51*  --   --   ALT 51  --   --   ALKPHOS 90  --   --   BILITOT 0.8  --   --   < > = values in this interval not displayed.   Recent Labs Lab 02/25/16 0227 02/25/16 0910 02/25/16 1532 02/25/16 2133  GLUCAP 206* 171* 174* 174*   No results for input(s): PHART, PCO2ART, PO2ART in the last 168 hours.  Recent Labs Lab 03/08/2016 2054  AST 51*  ALT 51  ALKPHOS 90  BILITOT 0.8  ALBUMIN 3.3*    Cardiac Enzymes  Recent Labs Lab 02/25/16 1433  TROPONINI 19.65*    RADIOLOGY:  Dg Chest 1 View  Result Date: 02/27/2016 CLINICAL DATA:  65 year old male with shortness of breath EXAM: CHEST 1 VIEW COMPARISON:  Chest radiograph dated 02/26/2016 FINDINGS: Left pectoral Port-A-Cath with tip in stable positioning likely at the junction of the SVC/left innominate vein. Right apical pleural thickening and masslike opacity similar to prior radiograph. There is persistent diffuse interstitial prominence similar to the prior radiograph. Stable cardiac silhouette. No pleural effusion or pneumothorax. No acute osseous pathology. IMPRESSION: No significant interval change in the right apical opacity and pleural parenchymal densities. No  pneumothorax. Stable positioning of the left pectoral Port-A-Cath. Electronically Signed   By: Anner Crete M.D.   On: 02/27/2016 04:48       --Marda Stalker, MD.  ICU Pager: [phone] [facility] Office Number: [phone]  Patricia Pesa, M.D.  Vilinda Boehringer, M.D.  Merton Border, M.D  02/28/2016   Critical Care Attestation.  I have personally obtained a history, examined the patient, evaluated laboratory and imaging results, formulated the assessment and plan and placed orders. The Patient requires high complexity decision making for assessment and support, frequent evaluation and titration of therapies, application of advanced monitoring technologies and extensive interpretation of multiple databases. The patient has critical illness that could lead imminently to failure of 1 or more organ systems  and requires the highest level of physician preparedness to intervene.  Critical Care Time devoted to patient care services described in this note is 35 minutes and is exclusive of time spent in procedures.

## 2016-02-28 NOTE — Progress Notes (Signed)
SUBJECTIVE: had cp and SOB  Vitals:   02/28/16 0900 02/28/16 0923 02/28/16 1000 02/28/16 1119  BP: (!) 151/95 (!) 151/95 123/72   Pulse: (!) 135 (!) 123 (!) 114   Resp: (!) 33  19   Temp:      TempSrc:      SpO2: (!) 81%  91% (!) 88%  Weight:      Height:        Intake/Output Summary (Last 24 hours) at 02/28/16 1205 Last data filed at 02/28/16 1000  Gross per 24 hour  Intake           894.43 ml  Output              925 ml  Net           -30.57 ml    LABS: Basic Metabolic Panel:  Recent Labs  02/26/16 0411 02/27/16 0503  NA 133* 133*  K 4.1 3.5  CL 95* 94*  CO2 27 30  GLUCOSE 172* 165*  BUN 49* 49*  CREATININE 1.16 1.09  CALCIUM 8.4* 8.4*   Liver Function Tests: No results for input(s): AST, ALT, ALKPHOS, BILITOT, PROT, ALBUMIN in the last 72 hours. No results for input(s): LIPASE, AMYLASE in the last 72 hours. CBC:  Recent Labs  02/26/16 0411 02/27/16 0503 02/28/16 0142  WBC 42.4* 24.1* 29.3*  NEUTROABS 40.7*  --   --   HGB 11.9* 10.3* 9.8*  HCT 36.0* 30.4* 28.8*  MCV 87.2 86.9 86.9  PLT 98* 93* 119*   Cardiac Enzymes:  Recent Labs  02/25/16 1433  TROPONINI 19.65*   BNP: Invalid input(s): POCBNP D-Dimer: No results for input(s): DDIMER in the last 72 hours. Hemoglobin A1C: No results for input(s): HGBA1C in the last 72 hours. Fasting Lipid Panel: No results for input(s): CHOL, HDL, LDLCALC, TRIG, CHOLHDL, LDLDIRECT in the last 72 hours. Thyroid Function Tests: No results for input(s): TSH, T4TOTAL, T3FREE, THYROIDAB in the last 72 hours.  Invalid input(s): FREET3 Anemia Panel: No results for input(s): VITAMINB12, FOLATE, FERRITIN, TIBC, IRON, RETICCTPCT in the last 72 hours.   PHYSICAL EXAM General: Well developed, well nourished, in no acute distress HEENT:  Normocephalic and atramatic Neck:  No JVD.  Lungs: Clear bilaterally to auscultation and percussion. Heart: HRRR . Normal S1 and S2 without gallops or murmurs.  Abdomen: Bowel  sounds are positive, abdomen soft and non-tender  Msk:  Back normal, normal gait. Normal strength and tone for age. Extremities: No clubbing, cyanosis or edema.   Neuro: Alert and oriented X 3. Psych:  Good affect, responds appropriately  TELEMETRY: sinus tach  ASSESSMENT AND PLAN:  Sean David, MD Physician Signed Cardiology  Progress Notes Date of Service: 02/27/2016 10:49 AM    Expand All Collapse All   '[]'$ Hide copied text '[]'$ Hover for attribution information SUBJECTIVE: Patient denies any chest pain but is very short of breath.         Vitals:   02/27/16 0444 02/27/16 0500 02/27/16 0800 02/27/16 0828  BP:  128/83 (!) 146/101   Pulse:  (!) 106 (!) 112   Resp:  15 (!) 24   Temp:    97.6 F (36.4 C)  TempSrc:      SpO2:  98% 99% (!) 88%  Weight: 158 lb 11.7 oz (72 kg)     Height:        Intake/Output Summary (Last 24 hours) at 02/27/16 1049 Last data filed at 02/27/16 0900  Gross per 24 hour  Intake           941.43 ml  Output             1425 ml  Net          -483.57 ml    LABS: Basic Metabolic Panel:  Recent Labs (last 2 labs)    Recent Labs  02/26/16 0411 02/27/16 0503  NA 133* 133*  K 4.1 3.5  CL 95* 94*  CO2 27 30  GLUCOSE 172* 165*  BUN 49* 49*  CREATININE 1.16 1.09  CALCIUM 8.4* 8.4*     Liver Function Tests:  Recent Labs (last 2 labs)    Recent Labs  02/29/2016 2054  AST 51*  ALT 51  ALKPHOS 90  BILITOT 0.8  PROT 6.7  ALBUMIN 3.3*     Recent Labs (last 2 labs)   No results for input(s): LIPASE, AMYLASE in the last 72 hours.   CBC:  Recent Labs (last 2 labs)    Recent Labs  02/26/16 0411 02/27/16 0503  WBC 42.4* 24.1*  NEUTROABS 40.7*  --   HGB 11.9* 10.3*  HCT 36.0* 30.4*  MCV 87.2 86.9  PLT 98* 93*     Cardiac Enzymes:  Recent Labs (last 2 labs)    Recent Labs  02/25/16 0413 02/25/16 1035 02/25/16 1433  TROPONINI 4.15* 12.34* 19.65*     BNP: Recent Labs (last 2  labs)   Invalid input(s): POCBNP   D-Dimer: Recent Labs (last 2 labs)   No results for input(s): DDIMER in the last 72 hours.   Hemoglobin A1C: Recent Labs (last 2 labs)   No results for input(s): HGBA1C in the last 72 hours.   Fasting Lipid Panel: Recent Labs (last 2 labs)   No results for input(s): CHOL, HDL, LDLCALC, TRIG, CHOLHDL, LDLDIRECT in the last 72 hours.   Thyroid Function Tests:  Recent Labs (last 2 labs)   No results for input(s): TSH, T4TOTAL, T3FREE, THYROIDAB in the last 72 hours.  Invalid input(s): FREET3   Anemia Panel: Recent Labs (last 2 labs)   No results for input(s): VITAMINB12, FOLATE, FERRITIN, TIBC, IRON, RETICCTPCT in the last 72 hours.     PHYSICAL EXAM General: Well developed, well nourished, in no acute distress HEENT:  Normocephalic and atramatic Neck:   No JVD.  Lungs: Clear bilaterally to auscultation and percussion. Heart: HRRR . Normal S1 and S2 without gallops or murmurs.  Abdomen: Bowel sounds are positive, abdomen soft and non-tender  Msk:  Back normal, normal gait. Normal strength and tone for age. Extremities: No clubbing, cyanosis or edema.   Neuro: Alert and oriented X 3. Psych:  Good affect, responds appropriately  TELEMETRY:Sinus tachycardia about 1 30 bpm  ASSESSMENT AND PLAN: Status post inferior wall myocardial infarction being conservatively treated as is DO NOT RESUSCITATE and did not want to have cardiac catheterization. Patient has history of metastatic lung cancer to the brain and does not want to have invasive procedures and hospice been following him.  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[place])   HCAP (healthcare-associated pneumonia)   Pulmonary embolism ([place])   DNR (do not resuscitate)   Palliative care by specialist   Acute pulmonary  edema ([place])   Acute respiratory failure ([place])   Hypoxia    Sean Wilmes A, MD, [facility] 02/27/2016  10:49 AM          Principal Problem:   Sepsis ([place]) Active Problems:   Congestive heart failure (HCC)   Chronic obstructive pulmonary disease (HCC)   BP (high blood pressure)   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

## 2016-02-28 NOTE — Progress Notes (Addendum)
Writer has inquired whether patient feels he can tolerate a bath this shift, d/t he asked night staff about the possibility of bathing.  Patient states he is comfortable for now, and does not think he is "breathing well enough" to wash up or change his bed right now.  He will let writer know if he changes his mind.  Writer inquired again about comfort measures for patient.  Dr Juanell Fairly contacted pastor who is POA and will talk with him about prognosis

## 2016-02-28 NOTE — Progress Notes (Signed)
Pharmacy Progress Note  Sean Hall is a 65 y.o. male admitted on 02/22/2016 with pneumonia.  Pharmacy has been consulted for cefepime dosing   Plan: 1. Antibiotics: Continue cefepime 2g IV Q8hr.    2. Electrolytes No further electrolyte supplementation warranted for now.     Height: '6\' 2"'$  (188 cm) Weight: 160 lb 7.9 oz (72.8 kg) IBW/kg (Calculated) : 82.2  Temp (24hrs), Avg:97.8 F (36.6 C), Min:97.6 F (36.4 C), Max:98 F (36.7 C)   Recent Labs Lab 02/14/2016 2054 02/25/16 1035 02/26/16 0411 02/27/16 0503 02/28/16 0142  WBC 44.3* 51.7* 42.4* 24.1* 29.3*  CREATININE 1.07 1.09 1.16 1.09  --     Estimated Creatinine Clearance: 70.5 mL/min (by C-G formula based on SCr of 1.09 mg/dL).    Allergies  Allergen Reactions  . Aleve [Naproxen Sodium] Hives, Shortness Of Breath and Swelling    Naproxin    Antimicrobials this admission: vancomycin  10/18 >> 10/19 levofloxacin 10/18 >> 10/18 Cefepime 10/19 >>  Dose adjustments this admission: N/A  Microbiology results: 10/18 BCx: BCID - no organism detected.  10/19 MRSA PCR: negative   Pharmacy will continue to monitor and adjust per consult.    Sujata Maines D 02/28/2016 10:59 AM

## 2016-02-28 NOTE — Progress Notes (Signed)
Patient ID: Sean Hall, male   DOB: 26-Feb-1951, 65 y.o.   MRN: [id number]  [employer] PROGRESS NOTE  Sean Hall MWN:[id number] DOB: 09-06-1950 DOA: 02/27/2016 PCP: Lloyd Huger, MD  HPI/Subjective: Patient is short of breath. No chest pain. Some cough but dry. Patient states he can't see well out of his left eye. Tunnel vision in the left eye  Objective: Vitals:   02/28/16 1300 02/28/16 1400  BP: (!) 150/92 (!) 148/100  Pulse: (!) 117 (!) 113  Resp: (!) 24 20  Temp:      Filed Weights   02/25/16 0225 02/27/16 0444 02/28/16 0500  Weight: 75.4 kg (166 lb 3.6 oz) 72 kg (158 lb 11.7 oz) 72.8 kg (160 lb 7.9 oz)    ROS: Review of Systems  Constitutional: Negative for chills and fever.  Eyes: Positive for blurred vision. Negative for pain.  Respiratory: Positive for cough, shortness of breath and wheezing.   Cardiovascular: Negative for chest pain.  Gastrointestinal: Negative for abdominal pain, constipation, diarrhea, nausea and vomiting.  Genitourinary: Negative for dysuria.  Musculoskeletal: Negative for joint pain.  Neurological: Negative for dizziness and headaches.   Exam: Physical Exam  Constitutional: He is oriented to person, place, and time.  HENT:  Nose: No mucosal edema.  Mouth/Throat: No oropharyngeal exudate or posterior oropharyngeal edema.  Eyes: Conjunctivae, EOM and lids are normal. Pupils are equal, round, and reactive to light.  Neck: No JVD present. Carotid bruit is not present. No edema present. No thyroid mass and no thyromegaly present.  Cardiovascular: S1 normal and S2 normal.  Tachycardia present.  Exam reveals no gallop.   Murmur heard.  Systolic murmur is present with a grade of 3/6  Pulses:      Dorsalis pedis pulses are 2+ on the right side, and 2+ on the left side.  Respiratory: Accessory muscle usage present. He is in respiratory distress. He has decreased breath sounds in the right middle field, the right lower field, the left  middle field and the left lower field. He has no wheezes. He has rhonchi in the right lower field and the left lower field. He has no rales.  GI: Soft. Bowel sounds are normal. There is no tenderness.  Musculoskeletal:       Right ankle: He exhibits swelling.       Left ankle: He exhibits swelling.  Lymphadenopathy:    He has no cervical adenopathy.  Neurological: He is alert and oriented to person, place, and time. No cranial nerve deficit.  Skin: Skin is warm. No rash noted.  Psychiatric: He has a normal mood and affect.      Data Reviewed: Basic Metabolic Panel:  Recent Labs Lab 02/16/2016 2054 02/25/16 1035 02/26/16 0411 02/27/16 0503  NA 132* 134* 133* 133*  K 4.0 4.2 4.1 3.5  CL 96* 97* 95* 94*  CO2 '24 24 27 30  '$ GLUCOSE 199* 175* 172* 165*  BUN 34* 40* 49* 49*  CREATININE 1.07 1.09 1.16 1.09  CALCIUM 8.8* 8.6* 8.4* 8.4*   Liver Function Tests:  Recent Labs Lab 02/23/2016 2054  AST 51*  ALT 51  ALKPHOS 90  BILITOT 0.8  PROT 6.7  ALBUMIN 3.3*   CBC:  Recent Labs Lab 02/13/2016 2054 02/25/16 1035 02/26/16 0411 02/27/16 0503 02/28/16 0142  WBC 44.3* 51.7* 42.4* 24.1* 29.3*  NEUTROABS  --   --  40.7*  --   --   HGB 12.8* 13.0 11.9* 10.3* 9.8*  HCT 39.2* 38.8* 36.0* 30.4* 28.8*  MCV 87.7 86.3 87.2 86.9 86.9  PLT 103* 89* 98* 93* 119*   Cardiac Enzymes:  Recent Labs Lab 02/28/2016 2054 02/25/16 0413 02/25/16 1035 02/25/16 1433  TROPONINI 0.52* 4.15* 12.34* 19.65*    CBG:  Recent Labs Lab 02/25/16 0227 02/25/16 0910 02/25/16 1532 02/25/16 2133  GLUCAP 206* 171* 174* 174*    Recent Results (from the past 240 hour(s))  Blood culture (routine x 2)     Status: None (Preliminary result)   Collection Time: 02/27/2016 10:56 PM  Result Value Ref Range Status   Specimen Description BLOOD  RIGHT AC  Final   Special Requests   Final    BOTTLES DRAWN AEROBIC AND ANAEROBIC  ANA 13ML AER 14ML   Culture  Setup Time Organism ID to follow  Final   Culture  NO GROWTH 3 DAYS  Final   Report Status PENDING  Incomplete  Blood culture (routine x 2)     Status: None (Preliminary result)   Collection Time: 02/20/2016 10:56 PM  Result Value Ref Range Status   Specimen Description BLOOD  LEFT AC  Final   Special Requests   Final    BOTTLES DRAWN AEROBIC AND ANAEROBIC  ANA 4ML AER 4ML   Culture NO GROWTH 3 DAYS  Final   Report Status PENDING  Incomplete  Blood Culture ID Panel (Reflexed)     Status: None   Collection Time: 02/20/2016 10:56 PM  Result Value Ref Range Status   Enterococcus species NOT DETECTED NOT DETECTED Final   Listeria monocytogenes NOT DETECTED NOT DETECTED Final   Staphylococcus species NOT DETECTED NOT DETECTED Final   Staphylococcus aureus NOT DETECTED NOT DETECTED Final   Streptococcus species NOT DETECTED NOT DETECTED Final   Streptococcus agalactiae NOT DETECTED NOT DETECTED Final   Streptococcus pneumoniae NOT DETECTED NOT DETECTED Final   Streptococcus pyogenes NOT DETECTED NOT DETECTED Final   Acinetobacter baumannii NOT DETECTED NOT DETECTED Final   Enterobacteriaceae species NOT DETECTED NOT DETECTED Final   Enterobacter cloacae complex NOT DETECTED NOT DETECTED Final   Escherichia coli NOT DETECTED NOT DETECTED Final   Klebsiella oxytoca NOT DETECTED NOT DETECTED Final   Klebsiella pneumoniae NOT DETECTED NOT DETECTED Final   Proteus species NOT DETECTED NOT DETECTED Final   Serratia marcescens NOT DETECTED NOT DETECTED Final   Haemophilus influenzae NOT DETECTED NOT DETECTED Final   Neisseria meningitidis NOT DETECTED NOT DETECTED Final   Pseudomonas aeruginosa NOT DETECTED NOT DETECTED Final   Candida albicans NOT DETECTED NOT DETECTED Final   Candida glabrata NOT DETECTED NOT DETECTED Final   Candida krusei NOT DETECTED NOT DETECTED Final   Candida parapsilosis NOT DETECTED NOT DETECTED Final   Candida tropicalis NOT DETECTED NOT DETECTED Final  MRSA PCR Screening     Status: None   Collection Time: 02/25/16   2:38 AM  Result Value Ref Range Status   MRSA by PCR NEGATIVE NEGATIVE Final    Comment:        The GeneXpert MRSA Assay (FDA approved for NASAL specimens only), is one component of a comprehensive MRSA colonization surveillance program. It is not intended to diagnose MRSA infection nor to guide or monitor treatment for MRSA infections.      Studies: Dg Chest 1 View  Result Date: 02/27/2016 CLINICAL DATA:  65 year old male with shortness of breath EXAM: CHEST 1 VIEW COMPARISON:  Chest radiograph dated 02/26/2016 FINDINGS: Left pectoral Port-A-Cath with tip in stable positioning likely at the junction of the SVC/left innominate vein.  Right apical pleural thickening and masslike opacity similar to prior radiograph. There is persistent diffuse interstitial prominence similar to the prior radiograph. Stable cardiac silhouette. No pleural effusion or pneumothorax. No acute osseous pathology. IMPRESSION: No significant interval change in the right apical opacity and pleural parenchymal densities. No pneumothorax. Stable positioning of the left pectoral Port-A-Cath. Electronically Signed   By: Anner Crete M.D.   On: 02/27/2016 04:48    Scheduled Meds: . budesonide (PULMICORT) nebulizer solution  0.5 mg Nebulization BID  . ceFEPIme  2 g Intravenous Q8H  . famotidine  20 mg Oral Daily  . fentaNYL  12.5 mcg Transdermal Q72H  . insulin aspart  0-15 Units Subcutaneous TID WC  . insulin aspart  0-5 Units Subcutaneous QHS  . ipratropium-albuterol  3 mL Nebulization Q4H  . mouth rinse  15 mL Mouth Rinse BID  . [START ON 02/29/2016] methylPREDNISolone (SOLU-MEDROL) injection  40 mg Intravenous Q24H  . metoprolol tartrate  12.5 mg Oral BID  . nitroGLYCERIN  0.5 inch Topical Q6H  . senna-docusate  2 tablet Oral BID  . sodium chloride flush  3 mL Intravenous Q12H   Continuous Infusions: . heparin 1,400 Units/hr (02/28/16 1400)    Assessment/Plan:  1. Clinical sepsis with healthcare  associated pneumonia. Patient on cefepime. 2. Acute on chronic respiratory failure with hypoxia. Patient is on 100% high flow nasal cannula and 100% nonrebreather. I have never seen this before. Overall prognosis is poor. Patient will be here in the hospital while on 100% high flow nasal cannula. Patient is a DO NOT RESUSCITATE. Case discussed with critical care specialist. 3. NSTEMI- on IV heparin drip. Refused cardiac catheter. And low-dose beta blocker if possible. 4. Metastatic lung cancer to brain. Overall prognosis poor. 5. 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 Chronic pain on fentanyl patch 7. Pulmonary embolism on heparin drip  Overall prognosis is poor. Patient will be stuck here in the hospital while on high flow nasal cannula 100%.  otherwise remains stable, not much change.  Code Status:     Code Status Orders        Start     Ordered   02/25/16 0229  Do not attempt resuscitation (DNR)  Continuous    Question Answer Comment  In the event of cardiac or respiratory ARREST Do not call a "code blue"   In the event of cardiac or respiratory ARREST Do not perform Intubation, CPR, defibrillation or ACLS   In the event of cardiac or respiratory ARREST Use medication by any route, position, wound care, and other measures to relive pain and suffering. May use oxygen, suction and manual treatment of airway obstruction as needed for comfort.      02/25/16 0228    Code Status History    Date Active Date Inactive Code Status Order ID Comments User Context   02/12/2016  6:07 PM 02/15/2016  5:48 PM DNR [id number]  Hillary Bow, MD ED   02/06/2016  6:22 PM 02/08/2016  4:20 PM Full Code [id number]  Bettey Costa, MD Inpatient   11/11/2015  4:29 PM 11/12/2015  5:56 PM Full Code [id number]  Katha Cabal, MD Inpatient   11/07/2015 11:31 PM 11/09/2015  6:55 PM Full Code [id number]  Lance Coon, MD Inpatient    Advance Directive Documentation   Flowsheet Row Most Recent Value   Type of Advance Directive  Healthcare Power of Attorney, Living will  Pre-existing out of facility DNR order (yellow form or pink MOST form)  No data  "MOST" Form in Place?  No data     Family Communication: Spoke with family at the bedside but his pastor is the healthcare power of attorney Disposition Plan: To be determined  Consultants:  Cardiology  Critical care specialist  Antibiotics:  Cefepime  Time spent: 32 minutes. Patient is critically ill and high likelihood of cardiopulmonary arrest.  Ellin Fitzgibbons, Ali Molina

## 2016-02-29 ENCOUNTER — Ambulatory Visit: Payer: Medicaid Other | Admitting: Oncology

## 2016-02-29 ENCOUNTER — Inpatient Hospital Stay: Payer: Medicaid Other | Admitting: Oncology

## 2016-02-29 ENCOUNTER — Inpatient Hospital Stay: Payer: Medicaid Other

## 2016-02-29 ENCOUNTER — Ambulatory Visit: Payer: Medicaid Other

## 2016-02-29 DIAGNOSIS — R0603 Acute respiratory distress: Secondary | ICD-10-CM

## 2016-02-29 DIAGNOSIS — Z515 Encounter for palliative care: Secondary | ICD-10-CM

## 2016-02-29 DIAGNOSIS — Z66 Do not resuscitate: Secondary | ICD-10-CM

## 2016-02-29 DIAGNOSIS — R0600 Dyspnea, unspecified: Secondary | ICD-10-CM

## 2016-02-29 LAB — GLUCOSE, CAPILLARY
GLUCOSE-CAPILLARY: 168 mg/dL — AB (ref 65–99)
GLUCOSE-CAPILLARY: 192 mg/dL — AB (ref 65–99)
GLUCOSE-CAPILLARY: 138 mg/dL — AB (ref 65–99)
GLUCOSE-CAPILLARY: 173 mg/dL — AB (ref 65–99)
GLUCOSE-CAPILLARY: 168 mg/dL — AB (ref 65–99)
GLUCOSE-CAPILLARY: 149 mg/dL — AB (ref 65–99)
GLUCOSE-CAPILLARY: 124 mg/dL — AB (ref 65–99)
Glucose-Capillary: 188 mg/dL — ABNORMAL HIGH (ref 65–99)
Glucose-Capillary: 150 mg/dL — ABNORMAL HIGH (ref 65–99)
Glucose-Capillary: 163 mg/dL — ABNORMAL HIGH (ref 65–99)
Glucose-Capillary: 133 mg/dL — ABNORMAL HIGH (ref 65–99)
Glucose-Capillary: 181 mg/dL — ABNORMAL HIGH (ref 65–99)
Glucose-Capillary: 144 mg/dL — ABNORMAL HIGH (ref 65–99)
Glucose-Capillary: 214 mg/dL — ABNORMAL HIGH (ref 65–99)
Glucose-Capillary: 138 mg/dL — ABNORMAL HIGH (ref 65–99)
Glucose-Capillary: 141 mg/dL — ABNORMAL HIGH (ref 65–99)

## 2016-02-29 MED ORDER — MORPHINE BOLUS VIA INFUSION
2.0000 mg | INTRAVENOUS | Status: DC | PRN
  Administered 2016-02-29 – 2016-03-01 (×12): 2 mg via INTRAVENOUS
  Filled 2016-02-29: qty 2

## 2016-02-29 MED ORDER — MORPHINE SULFATE (PF) 2 MG/ML IV SOLN
2.0000 mg | INTRAVENOUS | Status: DC | PRN
  Administered 2016-02-29 (×6): 2 mg via INTRAVENOUS
  Filled 2016-02-29 (×6): qty 1

## 2016-02-29 MED ORDER — FUROSEMIDE 10 MG/ML IJ SOLN
INTRAMUSCULAR | Status: AC
  Administered 2016-02-29: 40 mg via INTRAVENOUS
  Filled 2016-02-29: qty 4

## 2016-02-29 MED ORDER — MORPHINE 100MG IN NS 100ML (1MG/ML) PREMIX INFUSION
2.0000 mg/h | INTRAVENOUS | Status: DC
  Administered 2016-02-29: 2 mg/h via INTRAVENOUS
  Filled 2016-02-29: qty 100

## 2016-02-29 MED ORDER — LORAZEPAM 2 MG/ML IJ SOLN
0.5000 mg | INTRAMUSCULAR | Status: DC | PRN
  Administered 2016-02-29 (×2): 1 mg via INTRAVENOUS
  Filled 2016-02-29 (×2): qty 1

## 2016-02-29 MED ORDER — FUROSEMIDE 10 MG/ML IJ SOLN
40.0000 mg | Freq: Once | INTRAMUSCULAR | Status: AC
  Administered 2016-02-29: 40 mg via INTRAVENOUS

## 2016-02-29 MED ORDER — IPRATROPIUM-ALBUTEROL 0.5-2.5 (3) MG/3ML IN SOLN
3.0000 mL | Freq: Four times a day (QID) | RESPIRATORY_TRACT | Status: DC
  Administered 2016-02-29 (×2): 3 mL via RESPIRATORY_TRACT
  Filled 2016-02-29 (×2): qty 3

## 2016-02-29 MED ORDER — IPRATROPIUM-ALBUTEROL 0.5-2.5 (3) MG/3ML IN SOLN
3.0000 mL | Freq: Once | RESPIRATORY_TRACT | Status: AC
  Administered 2016-02-29: 3 mL via RESPIRATORY_TRACT

## 2016-02-29 MED ORDER — MORPHINE SULFATE (PF) 2 MG/ML IV SOLN
2.0000 mg | Freq: Once | INTRAVENOUS | Status: AC
  Administered 2016-02-29: 2 mg via INTRAVENOUS

## 2016-02-29 NOTE — Progress Notes (Signed)
Patient is now on morphine drip. Family and HCPOA at bedside.

## 2016-02-29 NOTE — Progress Notes (Signed)
Patient ID: Sean Hall, male   DOB: February 29, 1951, 65 y.o.   MRN: [id number]  [facility] PROGRESS NOTE  Sean Hall FAO:[id number] DOB: 12/22/1950 DOA: 02/15/2016 PCP: Lloyd Huger, MD  HPI/Subjective: Patient is short of breath.  Had worsening agitation and SOB- so given morphin and ativan inj, Xray chest shows worsenign of infiltrates vs edema, requiring Bipap, drowsy. Palliative care met with family- plan is to discuss with pt once he wakes up, about his wishes and try to wean off bipap and comfort measures, if not than his POA will make this decision.   Objective: Vitals:   02/29/16 1400 02/29/16 1500  BP: 122/83 114/86  Pulse: (!) 103 (!) 112  Resp: 17 14  Temp:      Filed Weights   02/25/16 0225 02/27/16 0444 02/28/16 0500  Weight: 75.4 kg (166 lb 3.6 oz) 72 kg (158 lb 11.7 oz) 72.8 kg (160 lb 7.9 oz)    ROS: Review of Systems  Unable to perform ROS: Mental status change   Exam: Physical Exam  Constitutional: He appears distressed.  HENT:  Nose: No mucosal edema.  Mouth/Throat: No oropharyngeal exudate or posterior oropharyngeal edema.  Eyes: Conjunctivae and lids are normal. Pupils are equal, round, and reactive to light.  Neck: No JVD present. Carotid bruit is not present. No edema present. No thyroid mass and no thyromegaly present.  Cardiovascular: S1 normal and S2 normal.  Tachycardia present.  Exam reveals no gallop.   Murmur heard.  Systolic murmur is present with a grade of 3/6  Pulses:      Dorsalis pedis pulses are 2+ on the right side, and 2+ on the left side.  Respiratory: Accessory muscle usage present. He is in respiratory distress. He has decreased breath sounds in the right middle field, the right lower field, the left middle field and the left lower field. He has no wheezes. He has rhonchi in the right lower field and the left lower field. He has rales.  On bipap.  GI: Soft. Bowel sounds are normal. There is no tenderness.   Musculoskeletal:       Right ankle: He exhibits swelling.       Left ankle: He exhibits swelling.  Lymphadenopathy:    He has no cervical adenopathy.  Neurological:  -pt is drowsy with ativan, on bipap support.  Skin: Skin is warm. No rash noted.  Psychiatric: He has a normal mood and affect.      Data Reviewed: Basic Metabolic Panel:  Recent Labs Lab 02/29/2016 2054 02/25/16 1035 02/26/16 0411 02/27/16 0503  NA 132* 134* 133* 133*  K 4.0 4.2 4.1 3.5  CL 96* 97* 95* 94*  CO2 '24 24 27 30  '$ GLUCOSE 199* 175* 172* 165*  BUN 34* 40* 49* 49*  CREATININE 1.07 1.09 1.16 1.09  CALCIUM 8.8* 8.6* 8.4* 8.4*   Liver Function Tests:  Recent Labs Lab 02/07/2016 2054  AST 51*  ALT 51  ALKPHOS 90  BILITOT 0.8  PROT 6.7  ALBUMIN 3.3*   CBC:  Recent Labs Lab 02/18/2016 2054 02/25/16 1035 02/26/16 0411 02/27/16 0503 02/28/16 0142  WBC 44.3* 51.7* 42.4* 24.1* 29.3*  NEUTROABS  --   --  40.7*  --   --   HGB 12.8* 13.0 11.9* 10.3* 9.8*  HCT 39.2* 38.8* 36.0* 30.4* 28.8*  MCV 87.7 86.3 87.2 86.9 86.9  PLT 103* 89* 98* 93* 119*   Cardiac Enzymes:  Recent Labs Lab 02/25/2016 2054 02/25/16 0413 02/25/16 1035 02/25/16 1433  TROPONINI 0.52* 4.15* 12.34* 19.65*    CBG:  Recent Labs Lab 02/28/16 1610 02/28/16 1705 02/28/16 1959 02/28/16 2201 02/29/16 0714  GLUCAP 124* 144* 214* 138* 141*    Recent Results (from the past 240 hour(s))  Blood culture (routine x 2)     Status: None (Preliminary result)   Collection Time: 03/02/2016 10:56 PM  Result Value Ref Range Status   Specimen Description BLOOD  RIGHT AC  Final   Special Requests   Final    BOTTLES DRAWN AEROBIC AND ANAEROBIC  ANA 13ML AER 14ML   Culture  Setup Time Organism ID to follow  Final   Culture NO GROWTH 4 DAYS  Final   Report Status PENDING  Incomplete  Blood culture (routine x 2)     Status: None (Preliminary result)   Collection Time: 02/11/2016 10:56 PM  Result Value Ref Range Status   Specimen  Description BLOOD  LEFT AC  Final   Special Requests   Final    BOTTLES DRAWN AEROBIC AND ANAEROBIC  ANA 4ML AER 4ML   Culture NO GROWTH 4 DAYS  Final   Report Status PENDING  Incomplete  Blood Culture ID Panel (Reflexed)     Status: None   Collection Time: 02/20/2016 10:56 PM  Result Value Ref Range Status   Enterococcus species NOT DETECTED NOT DETECTED Final   Listeria monocytogenes NOT DETECTED NOT DETECTED Final   Staphylococcus species NOT DETECTED NOT DETECTED Final   Staphylococcus aureus NOT DETECTED NOT DETECTED Final   Streptococcus species NOT DETECTED NOT DETECTED Final   Streptococcus agalactiae NOT DETECTED NOT DETECTED Final   Streptococcus pneumoniae NOT DETECTED NOT DETECTED Final   Streptococcus pyogenes NOT DETECTED NOT DETECTED Final   Acinetobacter baumannii NOT DETECTED NOT DETECTED Final   Enterobacteriaceae species NOT DETECTED NOT DETECTED Final   Enterobacter cloacae complex NOT DETECTED NOT DETECTED Final   Escherichia coli NOT DETECTED NOT DETECTED Final   Klebsiella oxytoca NOT DETECTED NOT DETECTED Final   Klebsiella pneumoniae NOT DETECTED NOT DETECTED Final   Proteus species NOT DETECTED NOT DETECTED Final   Serratia marcescens NOT DETECTED NOT DETECTED Final   Haemophilus influenzae NOT DETECTED NOT DETECTED Final   Neisseria meningitidis NOT DETECTED NOT DETECTED Final   Pseudomonas aeruginosa NOT DETECTED NOT DETECTED Final   Candida albicans NOT DETECTED NOT DETECTED Final   Candida glabrata NOT DETECTED NOT DETECTED Final   Candida krusei NOT DETECTED NOT DETECTED Final   Candida parapsilosis NOT DETECTED NOT DETECTED Final   Candida tropicalis NOT DETECTED NOT DETECTED Final  MRSA PCR Screening     Status: None   Collection Time: 02/25/16  2:38 AM  Result Value Ref Range Status   MRSA by PCR NEGATIVE NEGATIVE Final    Comment:        The GeneXpert MRSA Assay (FDA approved for NASAL specimens only), is one component of a comprehensive MRSA  colonization surveillance program. It is not intended to diagnose MRSA infection nor to guide or monitor treatment for MRSA infections.      Studies: Dg Chest Port 1 View  Result Date: 02/29/2016 CLINICAL DATA:  65 year old male with acute respiratory distress EXAM: PORTABLE CHEST 1 VIEW COMPARISON:  Chest radiograph dated 02/27/2016 FINDINGS: Left-sided Port-A-Cath with tip in stable positioning. Right apical masslike pleural thickening as well as diffuse bilateral interstitial coarsening. There is persistent or worsened right mid lung field alveolar infiltrate. No significant pleural effusion or pneumothorax. Stable cardiac silhouette. No acute osseous  pathology. IMPRESSION: Slight interval worsening of the airspace and alveolar infiltrates involving the right mid lung field compared to the most recent prior radiograph. There is a background of diffuse interstitial coarsening and pleural parenchymal thickening similar to prior studies. Right apical pleural based mass/thickening corresponding to the known neoplasm. Left pectoral Port-A-Cath in stable positioning. Electronically Signed   By: Anner Crete M.D.   On: 02/29/2016 03:18    Scheduled Meds: . budesonide (PULMICORT) nebulizer solution  0.5 mg Nebulization BID  . fentaNYL  12.5 mcg Transdermal Q72H  . ipratropium-albuterol  3 mL Nebulization Q6H  . mouth rinse  15 mL Mouth Rinse BID  . metoprolol tartrate  12.5 mg Oral BID   Continuous Infusions:    Assessment/Plan:  1. Clinical sepsis with healthcare associated pneumonia. Patient on cefepime. 2. Acute on chronic respiratory failure with hypoxia. Patient was on 100% high flow nasal cannula and 100% nonrebreather.  Overall prognosis is poor.        Patient is a DO NOT RESUSCITATE.        Had worsening in condition and he is on bipap and ativan with morphin now.      Family and POA may decide about comfort care later today. 3. NSTEMI- on IV heparin drip. Refused cardiac  catheter. And low-dose beta blocker if possible. 4. Metastatic lung cancer to brain. Overall prognosis poor. 5. 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 Chronic pain on fentanyl patch 7. Pulmonary embolism on heparin drip  Overall prognosis is poor. Patient will be stuck here in the hospital while on high flow nasal cannula 100%.  otherwise remains stable, not much change.  Code Status:     Code Status Orders        Start     Ordered   02/25/16 0229  Do not attempt resuscitation (DNR)  Continuous    Question Answer Comment  In the event of cardiac or respiratory ARREST Do not call a "code blue"   In the event of cardiac or respiratory ARREST Do not perform Intubation, CPR, defibrillation or ACLS   In the event of cardiac or respiratory ARREST Use medication by any route, position, wound care, and other measures to relive pain and suffering. May use oxygen, suction and manual treatment of airway obstruction as needed for comfort.      02/25/16 0228    Code Status History    Date Active Date Inactive Code Status Order ID Comments User Context   02/12/2016  6:07 PM 02/15/2016  5:48 PM DNR [id number]  Hillary Bow, MD ED   02/06/2016  6:22 PM 02/08/2016  4:20 PM Full Code [id number]  Bettey Costa, MD Inpatient   11/11/2015  4:29 PM 11/12/2015  5:56 PM Full Code [id number]  Katha Cabal, MD Inpatient   11/07/2015 11:31 PM 11/09/2015  6:55 PM Full Code [id number]  Lance Coon, MD Inpatient    Advance Directive Documentation   Flowsheet Row Most Recent Value  Type of Advance Directive  Healthcare Power of Attorney, Living will  Pre-existing out of facility DNR order (yellow form or pink MOST form)  No data  "MOST" Form in Place?  No data     Family Communication: Spoke with family at the bedside but his pastor is the healthcare power of attorney Disposition Plan: To be determined  Consultants:  Cardiology  Critical care specialist  Antibiotics:  Cefepime  Time  spent: 30 minutes. Patient is critically ill terminal stage.  Nataly Pacifico, Rosalio Macadamia  [facility]

## 2016-02-29 NOTE — Progress Notes (Signed)
Daily Progress Note   Patient Name: Sean Hall       Date: 02/29/2016 DOB: 03-22-1951  Age: 65 y.o. MRN#: [id number] Attending Physician: Vaughan Basta, MD Primary Care Physician: Lloyd Huger, MD Admit Date: 02/13/2016  Reason for Consultation/Follow-up: Establishing goals of care and Psychosocial/spiritual support  Subjective: -continued conversation with wife, HPOA/Andy, and several other family members regarding current medical situation, overall GOC, symptom management   -comfort is the priority, all understand the limited prognosis, likely hours to days, however de-escalating off BiPap continues to be a very difficult decision for all  -initially  patient unable to participate 2/2 effects of prn medications, re-engged several hours later and patient asking to continue with BiPap  -HPOA verbalizes that "as long as patient is able to talk and tell me what he wants", he will not make changes as far as BiPap goes    Length of Stay: 5  Current Medications: Scheduled Meds:  . budesonide (PULMICORT) nebulizer solution  0.5 mg Nebulization BID  . fentaNYL  12.5 mcg Transdermal Q72H  . ipratropium-albuterol  3 mL Nebulization Q6H  . mouth rinse  15 mL Mouth Rinse BID  . metoprolol tartrate  12.5 mg Oral BID    Continuous Infusions:    PRN Meds: acetaminophen **OR** acetaminophen, LORazepam, morphine injection, [DISCONTINUED] ondansetron **OR** ondansetron (ZOFRAN) IV  Physical Exam  Constitutional: He appears lethargic. He appears ill.  Cardiovascular: Tachycardia present.   Pulmonary/Chest: He has decreased breath sounds.  currently on Bi Pap  Neurological: He appears lethargic.  Skin: Skin is warm and dry.            Vital Signs: BP 114/86   Pulse (!)  112   Temp 98.1 F (36.7 C) (Axillary)   Resp 14   Ht '6\' 2"'$  (1.88 m)   Wt 72.8 kg (160 lb 7.9 oz)   SpO2 97%   BMI 20.61 kg/m  SpO2: SpO2: 97 % O2 Device: O2 Device: Bi-PAP O2 Flow Rate: O2 Flow Rate (L/min): 50 L/min  Intake/output summary:  Intake/Output Summary (Last 24 hours) at 02/29/16 1701 Last data filed at 02/29/16 1330  Gross per 24 hour  Intake              416 ml  Output  1900 ml  Net            -1484 ml   LBM: Last BM Date: 02/10/2016 Baseline Weight: Weight: 73.5 kg (162 lb) Most recent weight: Weight: 72.8 kg (160 lb 7.9 oz)       Palliative Assessment/Data: 20 %    Flowsheet Rows   Flowsheet Row Most Recent Value  Intake Tab  Referral Department  Hospitalist  Unit at Time of Referral  ICU  Palliative Care Primary Diagnosis  Cardiac  Date Notified  02/25/16  Palliative Care Type  New Palliative care  Reason for referral  Clarify Goals of Care, Psychosocial or Spiritual support  Date of Admission  02/16/2016  Date first seen by Palliative Care  02/25/16  # of days Palliative referral response time  0 Day(s)  # of days IP prior to Palliative referral  1  Clinical Assessment  Palliative Performance Scale Score  30%  Psychosocial & Spiritual Assessment  Palliative Care Outcomes      Patient Active Problem List   Diagnosis Date Noted  . DNR (do not resuscitate) 02/25/2016  . Palliative care by specialist 02/25/2016  . Acute pulmonary edema (HCC)   . Acute respiratory failure ([place])   . Hypoxia   . HCAP (healthcare-associated pneumonia) 02/25/2016  . Pulmonary embolism ([place]) 02/20/2016  . Sepsis ([place]) 02/16/2016  . Brain metastases ([place]) 02/12/2016  . Pneumonia 02/06/2016  . DVT (deep venous thrombosis), left 11/11/2015  . DVT (deep venous thrombosis) ([place]) 11/07/2015  . Malignant neoplasm of right upper lobe of lung ([place]) 10/17/2015  . Disease of lung 11/25/2013  . BP (high blood pressure) 11/25/2013  . Atrial fibrillation ([place])  11/22/2013  . Congestive heart failure ([place]) 11/22/2013  . Chronic kidney disease 11/22/2013  . Chronic obstructive pulmonary disease ([place]) 11/22/2013  . PNA (pneumonia) 11/22/2013    Palliative Care Assessment & Plan    Assessment:  -Chronic respiratory failure 2/2 metastatic lung cancer, pneumonia, NSTEMI, PE.  -transitioning at EOL  -patient and family facing difficult decisions related de-escalation of medical interventions within the context of comfort, ie BiPap   Recommendations/Plan:  Comfort is the main focus of care, however at this time HPOA understands the patient to be requesting continuation of the BiPap  Utilize prn medications as prescribed to enhance comfort, if patient  is requiring frequent prns/every3 hrs, consider continuous gtt.  Discussed with family and they are in agreement  PMT will f/u in the morning   Code Status:    Code Status Orders        Start     Ordered   02/25/16 0229  Do not attempt resuscitation (DNR)  Continuous    Question Answer Comment  In the event of cardiac or respiratory ARREST Do not call a "code blue"   In the event of cardiac or respiratory ARREST Do not perform Intubation, CPR, defibrillation or ACLS   In the event of cardiac or respiratory ARREST Use medication by any route, position, wound care, and other measures to relive pain and suffering. May use oxygen, suction and manual treatment of airway obstruction as needed for comfort.      02/25/16 0228    Code Status History    Date Active Date Inactive Code Status Order ID Comments User Context   02/12/2016  6:07 PM 02/15/2016  5:48 PM DNR [id number]  Hillary Bow, MD ED   02/06/2016  6:22 PM 02/08/2016  4:20 PM Full Code [id number]  Bettey Costa, MD Inpatient  11/11/2015  4:29 PM 11/12/2015  5:56 PM Full Code [id number]  Katha Cabal, MD Inpatient   11/07/2015 11:31 PM 11/09/2015  6:55 PM Full Code [id number]  Lance Coon, MD Inpatient    Advance Directive Documentation     Flowsheet Row Most Recent Value  Type of Advance Directive  Healthcare Power of Attorney, Living will  Pre-existing out of facility DNR order (yellow form or pink MOST form)  No data  "MOST" Form in Place?  No data       Prognosis:   Hours - Days  Discharge Planning:  To Be Determined  Care plan was discussed with Amber RN  Thank you for allowing the Palliative Medicine Team to assist in the care of this patient.   Time In: 1700 Time Out: 1735 Total Time 35 min Prolonged Time Billed  no       Greater than 50%  of this time was spent counseling and coordinating care related to the above assessment and plan.  Wadie Lessen, NP  Please contact Palliative Medicine Team phone at [phone] for questions and concerns.

## 2016-02-29 NOTE — Progress Notes (Signed)
Patient has had several episodes within the last hour where he gets very hypoxic, sats in 60's, confused, and removes oxygen. After IV morphine and when patient has gained breath and more comfortable, patient is stating he "doesn't want to suffer anymore" and wishes "there was a way this could just be over." Hinton Dyer, NP notified and HCPOA notified.

## 2016-02-29 NOTE — Progress Notes (Signed)
ANTICOAGULATION CONSULT NOTE - Initial Consult  Pharmacy Consult for heparin drip Indication: ACS/STEMI/NSTEMI  Allergies  Allergen Reactions  . Aleve [Naproxen Sodium] Hives, Shortness Of Breath and Swelling    Naproxin    Patient Measurements: Height: '6\' 2"'$  (188 cm) Weight: 160 lb 7.9 oz (72.8 kg) IBW/kg (Calculated) : 82.2 Heparin Dosing Weight: 75kg  Vital Signs: Temp: 97.6 F (36.4 C) (10/23 0400) Temp Source: Axillary (10/23 0400) BP: 113/85 (10/23 0900) Pulse Rate: 123 (10/23 0900)  Labs:  Recent Labs  02/27/16 0503 02/27/16 1126 02/27/16 1810 02/28/16 0142  HGB 10.3*  --   --  9.8*  HCT 30.4*  --   --  28.8*  PLT 93*  --   --  119*  APTT 102* 69* 65* 68*  HEPARINUNFRC 1.12*  --  0.75* 0.84*  CREATININE 1.09  --   --   --     Estimated Creatinine Clearance: 70.5 mL/min (by C-G formula based on SCr of 1.09 mg/dL).   Medical History: Past Medical History:  Diagnosis Date  . Arthritis   . Atrial fibrillation ([place])   . Cancer ([place]) 2017   Lung  . CHF (congestive heart failure) ([place])   . Chronic kidney disease   . COPD (chronic obstructive pulmonary disease) ([place])   . Disease of lung   . DVT (deep venous thrombosis) (HCC)    left leg  . Dysrhythmia   . Headache   . History of chemotherapy   . History of radiation therapy   . Hypertension   . Pneumonia     Medications:  Pharmacy consulted to dose and monitor heparin drip in this 65 year old male admitted with ACS/NSTEMI. On apixaban as outpatient so will need to dose off of aPTT until aPTT and HL coincide. Patient currently ordered heparin drip at 1400 units/hr.   Assessment:  Goal of Therapy:  Heparin level 0.3-0.7 units/ml  APTT 66 - 102 seconds Monitor platelets by anticoagulation protocol: Yes   Plan:   Patient currently refusing lab drawls and is meeting with palliative care this afternoon. No signs or symptoms of bleeding noted or discussed during am rounds.Will readdress  anticoagulation plan after palliative care meeting.     Pharmacy will continue to monitor and adjust per consult.    Simpson,Michael L 02/29/2016,11:37 AM

## 2016-02-29 NOTE — Progress Notes (Signed)
Notified pts. HCPOA Mr. Sean Hall pts condition has declined he has become increasingly short of breath despite administration of morphine with O2 sats decreased to 60% on HFNC and nonrebreather.  He was placed back on Bipap at 100%,however he still remains tachypneic and stating "he wants this to be over and is ready to be comfort care." Placed order for morphine drip for pts comfort.  HCPOA stated he would notify the pts family and he would arrive at the hospital in 20 minutes.  Will continue to monitor and assess pt.  Marda Stalker, [place] Pager [phone] (please enter 7 digits) PCCM Consult Pager [phone] (please enter 7 digits)

## 2016-02-29 NOTE — Progress Notes (Signed)
Pt made comfort care only family at bedside with [facility] Mr. Laymond Purser all are agreeable to this.  Will continue to monitor and assess pt.  Marda Stalker, [place] Pager [phone] (please enter 7 digits) PCCM Consult Pager [phone] (please enter 7 digits)

## 2016-02-29 NOTE — Progress Notes (Signed)
Mr. Okane HR is as high as 150's and O2 as low as 80% on 100% bipap. Patient is very adamant that we not call his family to inform him of his worsening condition after much explanation. Patient remains alert and oriented. Given '4mg'$  morphine, '40mg'$  lasix, and duoneb. Maggie, NP is aware and at bedside.

## 2016-02-29 NOTE — Care Management (Addendum)
Met with wife Butch Penny (just married ICU bedside Friday although "it's not on paper") this morning and she is expecting their minister to arrive prior to weaning O2. She is aware of probable outcome and expects that patient will pass away.  I offered comfort and my condolences. I've learned that Dow Adolph (their minister) is patient's HCPOA and he is struggling as to "when is it that patient cannot make a decision to withdraw care". Palliative consult pending.

## 2016-02-29 NOTE — Progress Notes (Signed)
Per NP requested PRN Duoneb given, pt. sats 85% with exertion, while on BIPAP 100% fio2

## 2016-02-29 NOTE — Progress Notes (Signed)
I am informed that the plan is for full comfort care under the direction of Palliative Care Medicine. Given stage IV lung cancer and end stage cardiomyopathy, these seems medically appropriate. I have made myself available as needed to the nursing staff and to the family  Merton Border, MD PCCM service Mobile [phone] Pager [phone] 02/29/2016

## 2016-02-29 NOTE — Progress Notes (Signed)
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  INTERVAL HISTORY: Patient evaluated earlier today. His status is declining. He had recently received Ativan. He is lethargic and difficult to arouse. Therefore, patient's condition has declined throughout the afternoon into the evening and has been transitioned to comfort care. Family at bedside.  REVIEW OF SYSTEMS:   Review of Systems  Unable to perform ROS: Medical condition    As per HPI. Otherwise, a complete review of systems is negative.  PAST MEDICAL HISTORY: Past Medical History:  Diagnosis Date  . Arthritis   . Atrial fibrillation ([place])   . Cancer ([place]) 2017   Lung  . CHF (congestive heart failure) ([place])   . Chronic kidney disease   . COPD (chronic obstructive pulmonary disease) ([place])   . Disease of lung   . DVT (deep venous thrombosis) ([facility])    left leg  . Dysrhythmia   . Headache   . History of chemotherapy   . History of radiation therapy   . Hypertension   . Pneumonia     PAST SURGICAL HISTORY: Past Surgical History:  Procedure Laterality Date  . KNEE SURGERY Bilateral   . PERIPHERAL VASCULAR CATHETERIZATION Left 11/11/2015   Procedure: Thrombectomy;  Surgeon: Katha Cabal, MD;  Location: [facility];  Service: Cardiovascular;  Laterality: Left;  . PORTACATH PLACEMENT N/A 10/29/2015   Procedure: INSERTION PORT-A-CATH;  Surgeon: Nestor Lewandowsky, MD;  Location: [facility];  Service: General;  Laterality: N/A;  . TONSILLECTOMY      FAMILY HISTORY: Family History  Problem Relation Age of Onset  . Diabetes Father   .  Hypertension Father     ADVANCED DIRECTIVES (Y/N):  '@ADVDIR'$ @  HEALTH MAINTENANCE: Social History  Substance Use Topics  . Smoking status: Former Smoker    Packs/day: 2.00    Years: 50.00    Types: Cigarettes    Quit date: 08/07/2013  . Smokeless tobacco: Never Used  . Alcohol use No     Comment: rarely     Colonoscopy:  PAP:  Bone density:  Lipid panel:  Allergies  Allergen Reactions  . Aleve [Naproxen Sodium] Hives, Shortness Of Breath and Swelling    Naproxin    Current Facility-Administered Medications  Medication Dose Route Frequency Provider Last Rate Last Dose  . acetaminophen (TYLENOL) tablet 650 mg  650 mg Oral Q6H PRN Lance Coon, MD   650 mg at 02/28/16 1435   Or  . acetaminophen (TYLENOL) suppository 650 mg  650 mg Rectal Q6H PRN Lance Coon, MD      . budesonide (PULMICORT) nebulizer solution 0.5 mg  0.5 mg Nebulization BID Vishal Mungal, MD   0.5 mg at 02/29/16 2001  . fentaNYL (DURAGESIC - dosed mcg/hr) 12.5 mcg  12.5 mcg Transdermal Q72H Lance Coon, MD   12.5 mcg at 02/28/16 1696  . ipratropium-albuterol (DUONEB) 0.5-2.5 (3) MG/3ML nebulizer solution 3 mL  3 mL Nebulization Q6H Wilhelmina Mcardle, MD   3 mL at 02/29/16 2001  . LORazepam (ATIVAN) injection 0.5-1 mg  0.5-1 mg Intravenous Q4H PRN Mikael Spray, NP   1 mg at 02/29/16 1213  . MEDLINE mouth rinse  15 mL  Mouth Rinse BID Merrilee Seashore, MD   15 mL at 02/28/16 0927  . metoprolol tartrate (LOPRESSOR) tablet 12.5 mg  12.5 mg Oral BID Loletha Grayer, MD   12.5 mg at 02/28/16 6503  . morphine '100mg'$  in NS 115m ('1mg'$ /mL) infusion - premix  2 mg/hr Intravenous Continuous DAwilda Bill NP 2 mL/hr at 02/29/16 2223 2 mg/hr at 02/29/16 2223  . morphine 2 MG/ML injection 2-4 mg  2-4 mg Intravenous Q1H PRN MMikael Spray NP   2 mg at 02/29/16 2141  . ondansetron (ZOFRAN) 8 mg in sodium chloride 0.9 % 50 mL IVPB  8 mg Intravenous Q8H PRN DLance Coon MD       Facility-Administered Medications  Ordered in Other Encounters  Medication Dose Route Frequency Provider Last Rate Last Dose  . heparin lock flush 100 unit/mL  500 Units Intravenous Once TLloyd Huger MD      . sodium chloride flush (NS) 0.9 % injection 10 mL  10 mL Intravenous PRN TLloyd Huger MD        OBJECTIVE: Vitals:   02/29/16 2000 02/29/16 2045  BP: (!) 144/91   Pulse: (!) 125 (!) 126  Resp: (!) 27 (!) 27  Temp:       Body mass index is 20.61 kg/m.    ECOG FS:4 - Bedbound  General: Ill-appearing, moderate respiratory distress. Eyes: Pink conjunctiva, anicteric sclera. HEENT: High flow nasal cannula in place. Lungs: Clear to auscultation bilaterally. Heart: Tachycardic. Abdomen: Soft, nontender, nondistended. No organomegaly noted, normoactive bowel sounds. Musculoskeletal: No edema, cyanosis, or clubbing. Neuro: Lethargic Skin: No rashes or petechiae noted.   LAB RESULTS:  Lab Results  Component Value Date   NA 133 (L) 02/27/2016   K 3.5 02/27/2016   CL 94 (L) 02/27/2016   CO2 30 02/27/2016   GLUCOSE 165 (H) 02/27/2016   BUN 49 (H) 02/27/2016   CREATININE 1.09 02/27/2016   CALCIUM 8.4 (L) 02/27/2016   PROT 6.7 02/13/2016   ALBUMIN 3.3 (L) 02/18/2016   AST 51 (H) 03/06/2016   ALT 51 03/06/2016   ALKPHOS 90 03/03/2016   BILITOT 0.8 02/21/2016   GFRNONAA >60 02/27/2016   GFRAA >60 02/27/2016    Lab Results  Component Value Date   WBC 29.3 (H) 02/28/2016   NEUTROABS 40.7 (H) 02/26/2016   HGB 9.8 (L) 02/28/2016   HCT 28.8 (L) 02/28/2016   MCV 86.9 02/28/2016   PLT 119 (L) 02/28/2016     STUDIES: Dg Chest 1 View  Result Date: 02/27/2016 CLINICAL DATA:  65year old male with shortness of breath EXAM: CHEST 1 VIEW COMPARISON:  Chest radiograph dated 02/26/2016 FINDINGS: Left pectoral Port-A-Cath with tip in stable positioning likely at the junction of the SVC/left innominate vein. Right apical pleural thickening and masslike opacity similar to prior radiograph. There is  persistent diffuse interstitial prominence similar to the prior radiograph. Stable cardiac silhouette. No pleural effusion or pneumothorax. No acute osseous pathology. IMPRESSION: No significant interval change in the right apical opacity and pleural parenchymal densities. No pneumothorax. Stable positioning of the left pectoral Port-A-Cath. Electronically Signed   By: AAnner CreteM.D.   On: 02/27/2016 04:48   Dg Chest 2 View  Result Date: 02/06/2016 CLINICAL DATA:  Redness and swelling in the right lower extremity. Known lung cancer in the right upper lobe. EXAM: CHEST  2 VIEW COMPARISON:  November 04, 2015 FINDINGS: There was dense consolidation in the right upper lobe on the previous study which has improved. There  is now opacity throughout the right central lung, not seen previously. Scarring in the left apex. The left Port-A-Cath is stable. No pneumothorax. No change in the cardiomediastinal silhouette. No other interval changes. IMPRESSION: The dense consolidation in the right upper lobe is improved consistent with interval treatment. There is patchy opacity throughout the right central lung. Whether this is pneumonia or post treatment change is unclear on this single study. Recommend clinical correlation and follow-up to resolution. Electronically Signed   By: Dorise Bullion III M.D   On: 02/06/2016 15:48   Ct Head Wo Contrast  Result Date: 02/12/2016 CLINICAL DATA:  Headache, right arm pain. EXAM: CT HEAD WITHOUT CONTRAST TECHNIQUE: Contiguous axial images were obtained from the base of the skull through the vertex without intravenous contrast. COMPARISON:  MRI of October 19, 2015. FINDINGS: Brain: Large area of white matter edema is seen in the left parietal cortex concerning for underlying metastatic disease. Similar abnormality is seen in both frontal lobes, as well as left cerebellar hemisphere. No hemorrhage is noted. Ventricular size is within normal limits. No significant midline shift is noted  at this time. Vascular: Atherosclerosis of internal carotid arteries is noted. Skull: Bony calvarium appears intact. Sinuses/Orbits: Visualized paranasal sinuses appear normal. Other: None. IMPRESSION: Multiple areas of white matter edema are noted, including the left parietal cortex, both frontal lobes and left cerebellar hemisphere. This is concerning for underlying metastatic disease, and further evaluation with MRI with and without gadolinium is recommended. Electronically Signed   By: Marijo Conception, M.D.   On: 02/12/2016 15:41   Ct Angio Chest Pe W And/or Wo Contrast  Result Date: 02/13/2016 CLINICAL DATA:  Initial evaluation for acute shortness of breath. History of lung carcinoma. EXAM: CT ANGIOGRAPHY CHEST WITH CONTRAST TECHNIQUE: Multidetector CT imaging of the chest was performed using the standard protocol during bolus administration of intravenous contrast. Multiplanar CT image reconstructions and MIPs were obtained to evaluate the vascular anatomy. CONTRAST:  75 cc of Isovue 370. COMPARISON:  Prior MRI from 02/13/2016 as well as prior CT 09/22/2015. Comparison also made with prior radiograph from earlier the same day. FINDINGS: Cardiovascular: Intrathoracic aorta of normal caliber without acute abnormality. Scattered atheromatous plaque within the intrathoracic aorta. Visualized great vessels demonstrate no acute abnormality. Mild cardiomegaly with left atrial enlargement. Three vessel coronary artery calcifications. The no pericardial effusion. Pulmonary arteries adequately opacified for evaluation. Main pulmonary artery measures within normal limits for size at 2.8 cm in diameter. Few small linear filling defects are present within the left lower lobe segmental arteries (series 5, image 175, 200, 223), consistent with small acute pulmonary emboli. No other definite focal filling defects are identified elsewhere within the lungs. No findings to suggest right heart strain. RV LV ratio within  normal limits. Mediastinum/Nodes: Thyroid within normal limits. Right peritracheal lymph nodes measure up to 14 mm (series 4, images 34). Sub carinal nodal conglomerate measures 2.2 cm. Right hilar soft tissue fullness/nodal mass measures 3.9 x 2.2 cm. No left hilar adenopathy. Shotty sub cm prevascular and left paratracheal nodes noted. Mildly prominent right cardiophrenic nodes measure up to 9 mm (series 4, image 81). Esophagus within normal limits. No axillary adenopathy. Lungs/Pleura: Irregular mass like opacity at the right lung apex again seen, measuring approximately 5.4 x 7.2 x 6.5 cm on today's study. Multiple right upper lobe segmental artery seen coursing through the mass, which are somewhat attenuated and narrowed. Mass approximates the right upper margin of the mediastinum, and may directly invade the  mediastinum. Previously noted direct wall invasion better evaluated on recent MRI. Sub cm right supraclavicular lymph node noted. Irregular right pleural effusion is present. Extensive multifocal areas of ground-glass with interlobular septal thickening present throughout the lungs bilaterally (crazy paving), suspected to reflect pulmonary edema, superimposed on underlying emphysema. More confluent irregular areas of opacity within the right upper and mid lung may reflect atelectasis and/or areas of infection/infiltrate. Superimposed lymphangitic spread of tumor may be present as well. 19 mm pleural-based nodule noted at the mid right lung (series 6, image 64). Upper Abdomen: 11 mm hypodensity within the medial right hepatic lobe noted, stable, indeterminate. Remainder the visualized upper abdomen unremarkable. Musculoskeletal: No acute osseous abnormality. No worrisome lytic or blastic osseous lesions. Review of the MIP images confirms the above findings. IMPRESSION: 1. Few scattered small filling defects with faint left lower lobe segmental and subsegmental pulmonary arteries as above, consistent with  acute pulmonary emboli. No evidence for right heart strain. 2. Persistent right apical lung mass, compatible with known primary bronchogenic carcinoma. Lesion is decreased in size from 09/22/2015, measuring approximately 5.4 x 7.3 x 6.5 cm on today's study. Possible direct mediastinal invasion again noted. Mediastinal and right hilar as above. 3. Increased patchy parenchymal opacity within the right upper and mid lung. While this finding may in part reflect atelectasis, possible postobstructive pneumonia could also be considered. 4. Patchy multifocal ground-glass opacity with interlobular septal thickening throughout the lungs, suspected to reflect pulmonary edema superimposed on underlying emphysema. 5. Left pleural effusion. Critical Value/emergent results were called by telephone at the time of interpretation on 02/11/2016 at 11:05 pm to Dr. Harvest Dark , who verbally acknowledged these results. Electronically Signed   By: Jeannine Boga M.D.   On: 02/17/2016 23:10   Mr Jeri Cos UX Contrast  Result Date: 02/13/2016 CLINICAL DATA:  Headaches. RIGHT arm/ hand weakness and numbness. Lung cancer. EXAM: MRI HEAD WITHOUT AND WITH CONTRAST TECHNIQUE: Multiplanar, multiecho pulse sequences of the brain and surrounding structures were obtained without and with intravenous contrast. CONTRAST:  27m MULTIHANCE GADOBENATE DIMEGLUMINE 529 MG/ML IV SOLN COMPARISON:  CT head 02/12/2016 showing multiple abnormalities. MRI cervical spine and MR brachial plexus also, reported separately. FINDINGS: Brain: No restricted diffusion attributed to infarction. No acute or chronic hemorrhage, hydrocephalus, or extra-axial fluid. Premature for age atrophy. Minor white matter disease. Extensive vasogenic edema associated with multiple intracranial metastases demonstrating postcontrast enhancement, described below: - 3 mm, RIGHT cerebellum, image 14. - 8 mm, LEFT cerebellum, image 16. - 3 mm, LEFT occipital, image 23. Also  more laterally, sulcal enhancement, uncertain significance. - 5 mm, LEFT frontal parasagittal, image 26. - 4 mm, LEFT inferior frontal, image 26. - 9 mm, LEFT posterior temporal, image 27. - 8 mm, LEFT frontal, image 34. - 4 mm, RIGHT frontal, image 36. - 9 mm, LEFT anterior frontal, image 38. - 12 mm, index lesion, LEFT posterior frontal, image 42. Marked edema without significant midline shift. - 5 mm, LEFT superior frontal, image 44. - 4 mm, LEFT frontoparietal parasagittal, image 50. Vascular: Flow voids are maintained.  LEFT vertebral dominant. Skull and upper cervical spine: No osseous lesions. Cervical spine reported separately. Sinuses/Orbits: No layering sinus fluid.  No orbital mass. Other: None. IMPRESSION: Greater than 10 intracranial metastatic deposits. The dominant lesion is in the LEFT posterior frontal lobe, 12 mm diameter. The patient's RIGHT arm symptoms likely derive from this mass and/or surrounding edema. Electronically Signed   By: JStaci RighterM.D.   On: 02/13/2016 14:29  Mr Chest W Wo Contrast  Result Date: 02/13/2016 CLINICAL DATA:  Right arm pain.  History of lung cancer. EXAM: MRI CHEST WITHOUT AND WITH CONTRAST TECHNIQUE: Standard multiplanar MR imaging was performed of the right brachial plexus pre and post administration of gadolinium. CONTRAST:  70m MULTIHANCE GADOBENATE DIMEGLUMINE 529 MG/ML IV SOLN COMPARISON:  PET-CT 10/15/2015 and chest CT 09/22/2015 FINDINGS: Large apical lung mass is again demonstrated. MR findings are highly suspicious for chest wall invasion and rib involvement. I do not however see any definite involvement of the brachial plexus structures which are surrounded by fat and do not demonstrate any mass effect, inflammation or enhancement. Small supraclavicular and right subclavicular lymph nodes are noted. The largest node measures approximately 12 mm and is located adjacent to the trachea (best seen on series 3, image 25). Mild edema like signal  abnormality and enhancement in the right chest wall musculature. No significant findings in the cervical spine. IMPRESSION: 1. Large right apical lung mass with findings highly suspicious for direct chest wall invasion and rib involvement. 2. No definite involvement of the brachial plexus structures. Electronically Signed   By: PMarijo SanesM.D.   On: 02/13/2016 13:32   Mr Cervical Spine W Wo Contrast  Result Date: 02/11/2016 CLINICAL DATA:  RIGHT hand weakness and numbness. Symptoms for 2 weeks. No injury. Lung cancer. EXAM: MRI CERVICAL SPINE WITHOUT AND WITH CONTRAST TECHNIQUE: Multiplanar and multiecho pulse sequences of the cervical spine, to include the craniocervical junction and cervicothoracic junction, were obtained according to standard protocol without and with intravenous contrast. CONTRAST:  14mMULTIHANCE GADOBENATE DIMEGLUMINE 529 MG/ML IV SOLN COMPARISON:  PET scan 10/15/2015.  Chest radiograph 02/06/2016. FINDINGS: Alignment: Anatomic Vertebrae: Fatty replaced marrow at T1, T2, related to XRT. No definite tumor involvement. Cord: No cord compression or abnormal cord signal. Posterior Fossa: No visible tonsillar herniation. Vertebral Arteries: Are patent, LEFT dominant. Paraspinal tissues: Incompletely evaluated on this cervical spine MR is a suspected large RIGHT paravertebral mass, which would correspond to regional spread of tumor at the RIGHT lung apex. See sagittal image 1, arrows, multiple series. This area is not covered with the provided axial images. Concern is raised for brachial plexus involvement given the symptoms of RIGHT hand weakness and numbness. Disc levels: C2-3:  Normal. C3-4: Mild facet arthropathy. Slight LEFT-sided uncinate spurring. No C4 foraminal narrowing. C4-5: Shallow central protrusion. Mild facet arthropathy. LEFT-sided uncinate spurring. LEFT C5 foraminal narrowing. C5-6:  Annular bulge.  No impingement. C6-7: Central protrusion extends more to the LEFT than  RIGHT. Superimposed uncinate spurring and asymmetric LEFT-sided facet arthropathy. LEFT C7 foraminal narrowing. C7-T1:  Unremarkable. IMPRESSION: Mild cervical spondylosis, without significant RIGHT-sided neural impingement. Suspected RIGHT paravertebral mass, incompletely evaluated, consistent with regional involvement by the patient's known RIGHT lung cancer. Recommend MRI of the brachial plexus without and with contrast for further evaluation, which could be performed anytime after 10 a.m. on 02/13/2016 to allow sufficient gadolinium elimination. Electronically Signed   By: JoStaci Righter.D.   On: 02/11/2016 12:01   Us[place]oreaenous Img Lower Unilateral Right  Result Date: 02/06/2016 CLINICAL DATA:  Air edema and pain for 1 day in the right lower extremity EXAM: RIGHT LOWER EXTREMITY VENOUS DUPLEX ULTRASOUND TECHNIQUE: Doppler venous assessment of the left lower extremity deep venous system was performed, including characterization of spectral flow, compressibility, and phasicity. COMPARISON:  None. FINDINGS: There is complete compressibility of the right common femoral, femoral, and popliteal veins. Doppler analysis demonstrates respiratory phasicity and augmentation of  flow with calf compression. No obvious calf vein thrombosis. There is occlusive thrombus in the right great saphenous vein beginning in the distal thigh to the distal calf. Incidental imaging of the left common femoral vein demonstrates nonocclusive thrombus within the left common femoral vein. IMPRESSION: There is no evidence of DVT in the right lower extremity There is superficial occlusive thrombus in the right greater saphenous vein as described There is partially occlusive DVT in the left common femoral vein. Compared to the prior study dated 11/07/2015, this has slightly improved. Electronically Signed   By: Marybelle Killings M.D.   On: 02/06/2016 14:53   Dg Chest Port 1 View  Result Date: 02/29/2016 CLINICAL DATA:  65 year old male with  acute respiratory distress EXAM: PORTABLE CHEST 1 VIEW COMPARISON:  Chest radiograph dated 02/27/2016 FINDINGS: Left-sided Port-A-Cath with tip in stable positioning. Right apical masslike pleural thickening as well as diffuse bilateral interstitial coarsening. There is persistent or worsened right mid lung field alveolar infiltrate. No significant pleural effusion or pneumothorax. Stable cardiac silhouette. No acute osseous pathology. IMPRESSION: Slight interval worsening of the airspace and alveolar infiltrates involving the right mid lung field compared to the most recent prior radiograph. There is a background of diffuse interstitial coarsening and pleural parenchymal thickening similar to prior studies. Right apical pleural based mass/thickening corresponding to the known neoplasm. Left pectoral Port-A-Cath in stable positioning. Electronically Signed   By: Anner Crete M.D.   On: 02/29/2016 03:18   Dg Chest Port 1 View  Result Date: 02/26/2016 CLINICAL DATA:  Respiratory failure. EXAM: PORTABLE CHEST 1 VIEW COMPARISON:  CT 02/13/2016.  Chest x-ray 03/04/2016. FINDINGS: PowerPort catheter stable position. Persistent right apical pleural-parenchymal thickening/mass noted. Persistent infiltrate in the right mid lung. Persistent diffuse interstitial prominence without interval change. Heart size stable. No prominent pleural effusion or pneumothorax . IMPRESSION: 1. PowerPort catheter in stable position. Persistent right apical pleural-parenchymal thickening/ mass without interval change. 2. Persistent unchanged right mid lung alveolar infiltrate. Persistent unchanged diffuse interstitial prominence. Electronically Signed   By: Marcello Moores  Register   On: 02/26/2016 07:31   Dg Chest Portable 1 View  Result Date: 02/21/2016 CLINICAL DATA:  Shortness of breath and chest pain.  Lung carcinoma. EXAM: PORTABLE CHEST 1 VIEW COMPARISON:  February 06, 2016 FINDINGS: There is new airspace opacity in the right mid  lung, concerning for acute pneumonia. There is widespread fibrosis bilaterally, much more severe on the right than on the left. There is asymmetric right apical pleural thickening,, likely due to radiation therapy change. There may be residual mass in this area. Fullness in the right hilum raises concern for adenopathy. Heart size is normal. Pulmonary vascularity is stable with some distortion due to the extensive fibrosis. Port-A-Cath tip is in the left innominate vein. No pneumothorax. No blastic or lytic bone lesions are evident. IMPRESSION: Suspect pneumonia right mid lung. Extensive fibrosis is noted bilaterally, more on the right than on the left. Suspect radiation therapy change in the right apex. Residual mass in this area may well be present. There is concern for right hilar adenopathy. Stable cardiac silhouette. Port-A-Cath tip in left innominate vein. No pneumothorax. Electronically Signed   By: Lowella Grip III M.D.   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  PLAN:    1. 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 XRT to his brain is currently on hold. Given patient's declining condition, no further treatments are planned. Comfort care measures were initiated earlier this evening. Appreciate palliative care input.  2. Acute MI: No further interventions planned.  Appreciate cardiology input. 3. Shortness breath: Likely multifactorial including acute MI, underlying lung cancer, and possible superimposed pneumonia. Patient remains on high flow oxygen. Appreciate pulmonary input.  Patient is DO NOT RESUSCITATE/DO NOT RESUSCITATE and comfort care only.  Lloyd Huger, MD   02/29/2016 10:51 PM

## 2016-02-29 NOTE — Progress Notes (Signed)
  SUBJECTIVE:  Pt resting quietly with eyes closed. Wearing Bipap.   Vitals:   02/29/16 0400 02/29/16 0500 02/29/16 0600 02/29/16 0700  BP: (!) 146/90 127/88 (!) 152/99   Pulse: (!) 128 (!) 113 (!) 126   Resp: (!) 21 (!) 26 (!) 21   Temp: 97.6 F (36.4 C)     TempSrc: Axillary     SpO2: 92% 92% 91% 96%  Weight:      Height:        Intake/Output Summary (Last 24 hours) at 02/29/16 0825 Last data filed at 02/29/16 0556  Gross per 24 hour  Intake              879 ml  Output             1850 ml  Net             -971 ml    LABS: Basic Metabolic Panel:  Recent Labs  02/27/16 0503  NA 133*  K 3.5  CL 94*  CO2 30  GLUCOSE 165*  BUN 49*  CREATININE 1.09  CALCIUM 8.4*   Liver Function Tests: No results for input(s): AST, ALT, ALKPHOS, BILITOT, PROT, ALBUMIN in the last 72 hours. No results for input(s): LIPASE, AMYLASE in the last 72 hours. CBC:  Recent Labs  02/27/16 0503 02/28/16 0142  WBC 24.1* 29.3*  HGB 10.3* 9.8*  HCT 30.4* 28.8*  MCV 86.9 86.9  PLT 93* 119*   Cardiac Enzymes: No results for input(s): CKTOTAL, CKMB, CKMBINDEX, TROPONINI in the last 72 hours. BNP: Invalid input(s): POCBNP D-Dimer: No results for input(s): DDIMER in the last 72 hours. Hemoglobin A1C: No results for input(s): HGBA1C in the last 72 hours. Fasting Lipid Panel: No results for input(s): CHOL, HDL, LDLCALC, TRIG, CHOLHDL, LDLDIRECT in the last 72 hours. Thyroid Function Tests: No results for input(s): TSH, T4TOTAL, T3FREE, THYROIDAB in the last 72 hours.  Invalid input(s): FREET3 Anemia Panel: No results for input(s): VITAMINB12, FOLATE, FERRITIN, TIBC, IRON, RETICCTPCT in the last 72 hours.   PHYSICAL EXAM General: Chronically ill appearing on Bipap HEENT:  Normocephalic and atramatic Neck:  No JVD.  Lungs: Clear bilaterally to auscultation and percussion. Heart: HR tachycardic, regular . Normal S1 and S2 without gallops or murmurs.  Abdomen: Bowel sounds are  positive, abdomen soft and non-tender  Extremities: No clubbing, cyanosis or edema.   Neuro: Resting quietly Psych:  Calm  TELEMETRY: Sinus tachycardia with rate 130 bpm  ASSESSMENT AND PLAN: Status post inferior wall myocardial infarction being conservatively treated as is DO NOT RESUSCITATE and did not want to have cardiac catheterization. Patient has history of metastatic lung cancer to the brain and does not want to have invasive procedures and hospice is following him.   Principal Problem:   Sepsis ([place]) Active Problems:   Congestive heart failure ([facility])   Chronic obstructive pulmonary disease ([facility])   BP (high blood pressure)   Malignant neoplasm of right upper lobe of lung ([place])   HCAP (healthcare-associated pneumonia)   Pulmonary embolism ([place])   DNR (do not resuscitate)   Palliative care by specialist   Acute pulmonary edema ([place])   Acute respiratory failure ([place])   Hypoxia    Daune Perch, NP 02/29/2016 8:25 AM

## 2016-03-01 ENCOUNTER — Ambulatory Visit: Payer: Medicaid Other

## 2016-03-01 LAB — CULTURE, BLOOD (ROUTINE X 2)
CULTURE: NO GROWTH
Culture: NO GROWTH

## 2016-03-01 MED ORDER — MORPHINE BOLUS VIA INFUSION
2.0000 mg | INTRAVENOUS | Status: DC | PRN
  Administered 2016-03-01: 4 mg via INTRAVENOUS
  Filled 2016-03-01: qty 4

## 2016-03-02 ENCOUNTER — Ambulatory Visit: Payer: Medicaid Other

## 2016-03-03 ENCOUNTER — Ambulatory Visit: Payer: Medicaid Other

## 2016-03-04 ENCOUNTER — Ambulatory Visit: Payer: Medicaid Other

## 2016-03-07 ENCOUNTER — Ambulatory Visit: Payer: Medicaid Other

## 2016-03-08 ENCOUNTER — Encounter (INDEPENDENT_AMBULATORY_CARE_PROVIDER_SITE_OTHER): Payer: Self-pay

## 2016-03-08 ENCOUNTER — Ambulatory Visit (INDEPENDENT_AMBULATORY_CARE_PROVIDER_SITE_OTHER): Payer: Self-pay | Admitting: Vascular Surgery

## 2016-03-08 ENCOUNTER — Ambulatory Visit: Payer: Medicaid Other

## 2016-03-09 ENCOUNTER — Ambulatory Visit: Payer: Medicaid Other

## 2016-03-09 NOTE — Progress Notes (Signed)
Nursing supervisor and CDS notified of death. Not eligible for any donation per [facility]. Reference number [id number].

## 2016-03-09 NOTE — Discharge Summary (Signed)
Date of death- March 16, 2016  Cause of death    Metastatic lunc cancer  Hospital stay  1. Clinical sepsis with healthcare associated pneumonia. Patient on cefepime. Not much improved. 2. Acute on chronic respiratory failure with hypoxia. Patient was on 100% high flow nasal cannula and 100% nonrebreather.  Overall prognosis is poor.        Patient is a DO NOT RESUSCITATE.        Had worsening in condition and he is on bipap and ativan with morphin now.      Family and POA decided about comfort care, later he died. 3. NSTEMI- on IV heparin drip. Refused cardiac catheter. And low-dose beta blocker if possible. 4. Metastatic lung cancer to brain. Overall prognosis poor. 5. 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 Chronic pain on fentanyl patch 7. Pulmonary embolism on heparin drip   For further details please see H& P and progress notes.

## 2016-03-09 NOTE — Progress Notes (Signed)
$'25mg'H$  Morphine wasted from morphine drip with Sable Feil, RN on [employer].

## 2016-03-09 NOTE — Significant Event (Addendum)
Patient has passed. Pronounced by Fredderick Severance, RN and Rae Roam, RN. Family at bedside.

## 2016-03-09 NOTE — Treatment Plan (Signed)
Dr. Marcille Blanco (prime doc) notified that patient is deceased.

## 2016-03-09 DEATH — deceased

## 2016-04-22 ENCOUNTER — Other Ambulatory Visit: Payer: Self-pay | Admitting: Nurse Practitioner

## 2016-06-06 ENCOUNTER — Ambulatory Visit: Payer: Medicaid Other | Admitting: Radiation Oncology

## 2018-05-25 IMAGING — CT NM PET TUM IMG INITIAL (PI) SKULL BASE T - THIGH
1 of 10 series · 1 of 25 positions shown · non-contrast
Comparison: Chest CT 09/22/2015

CLINICAL DATA: Initial treatment strategy for non-small cell lung
cancer.

EXAM:
NUCLEAR MEDICINE PET SKULL BASE TO THIGH
TECHNIQUE: 12.07 mCi F-18 FDG was injected intravenously. Full-ring PET imaging
was performed from the skull base to thigh after the radiotracer. CT
data was obtained and used for attenuation correction and anatomic
localization.
FASTING BLOOD GLUCOSE:  Value: 124 mg/dl

[Series 3: ct wb 5.0 b30f · axial · 5.0mm · 0.98mm/px · 1 of 329 slices shown]
[im 329/329  brain]
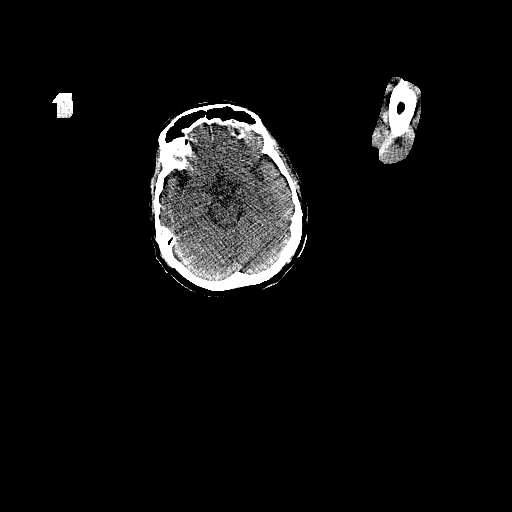

[1 of 25 positions shown; findings below may reference images not displayed]

FINDINGS: NECK

No hypermetabolic lymph nodes in the neck.

CHEST

Large right upper lobe lung mass is markedly hypermetabolic with SUV
max of 19. There is right hilar and mediastinal lymphadenopathy
which is also hypermetabolic. A right epicardial lymph image number
107 measures 21 mm and has SUV max of 4.9. Sub carinal lymph node on
image 1 to measures 13 mm and SUV max is 2.85. No contralateral
mediastinal or hilar adenopathy.

Small subpleural nodular density on image 125 is not hypermetabolic
and likely subpleural atelectasis. No definite metastatic pulmonary
nodules in the left lung.

Stable underlying emphysematous changes and pulmonary scarring.

Small right-sided pleural effusion but no hypermetabolism or pleural
nodularity.

Stable three-vessel coronary artery calcifications.

ABDOMEN/PELVIS

No abnormal hypermetabolic activity within the liver, pancreas,
adrenal glands, or spleen. No hypermetabolic lymph nodes in the
abdomen or pelvis. The low-attenuation liver lesions seen on the
prior CT scan is not hypermetabolic and somewhat photopenic. This is
likely a benign cyst. No adrenal gland metastasis.

Incidental findings include age advanced aortic and branch vessel
atherosclerotic calcifications and bilateral inguinal hernias
containing bowel.

SKELETON

No focal hypermetabolic activity to suggest skeletal metastasis.
IMPRESSION: 1. Large right upper lobe lung mass consistent with known neoplasm.
Mediastinal and hilar lymphadenopathy.
2. No findings for metastatic disease involving the lungs, neck,
abdomen/pelvis or osseous structures.

## 2018-06-14 IMAGING — CR DG CHEST 2V
1 series · 2 of 2 positions shown · non-contrast
Comparison: 10/29/2015

CLINICAL DATA: Malignant neoplasm RIGHT upper lobe, increasing
shortness of breath for 3 days, CHF, atrial fibrillation, COPD,
hypertension

EXAM:
CHEST  2 VIEW

[Series 1: dg chest 2 view · 0.14mm/px · 2 of 2 slices shown]
[im 1/2]
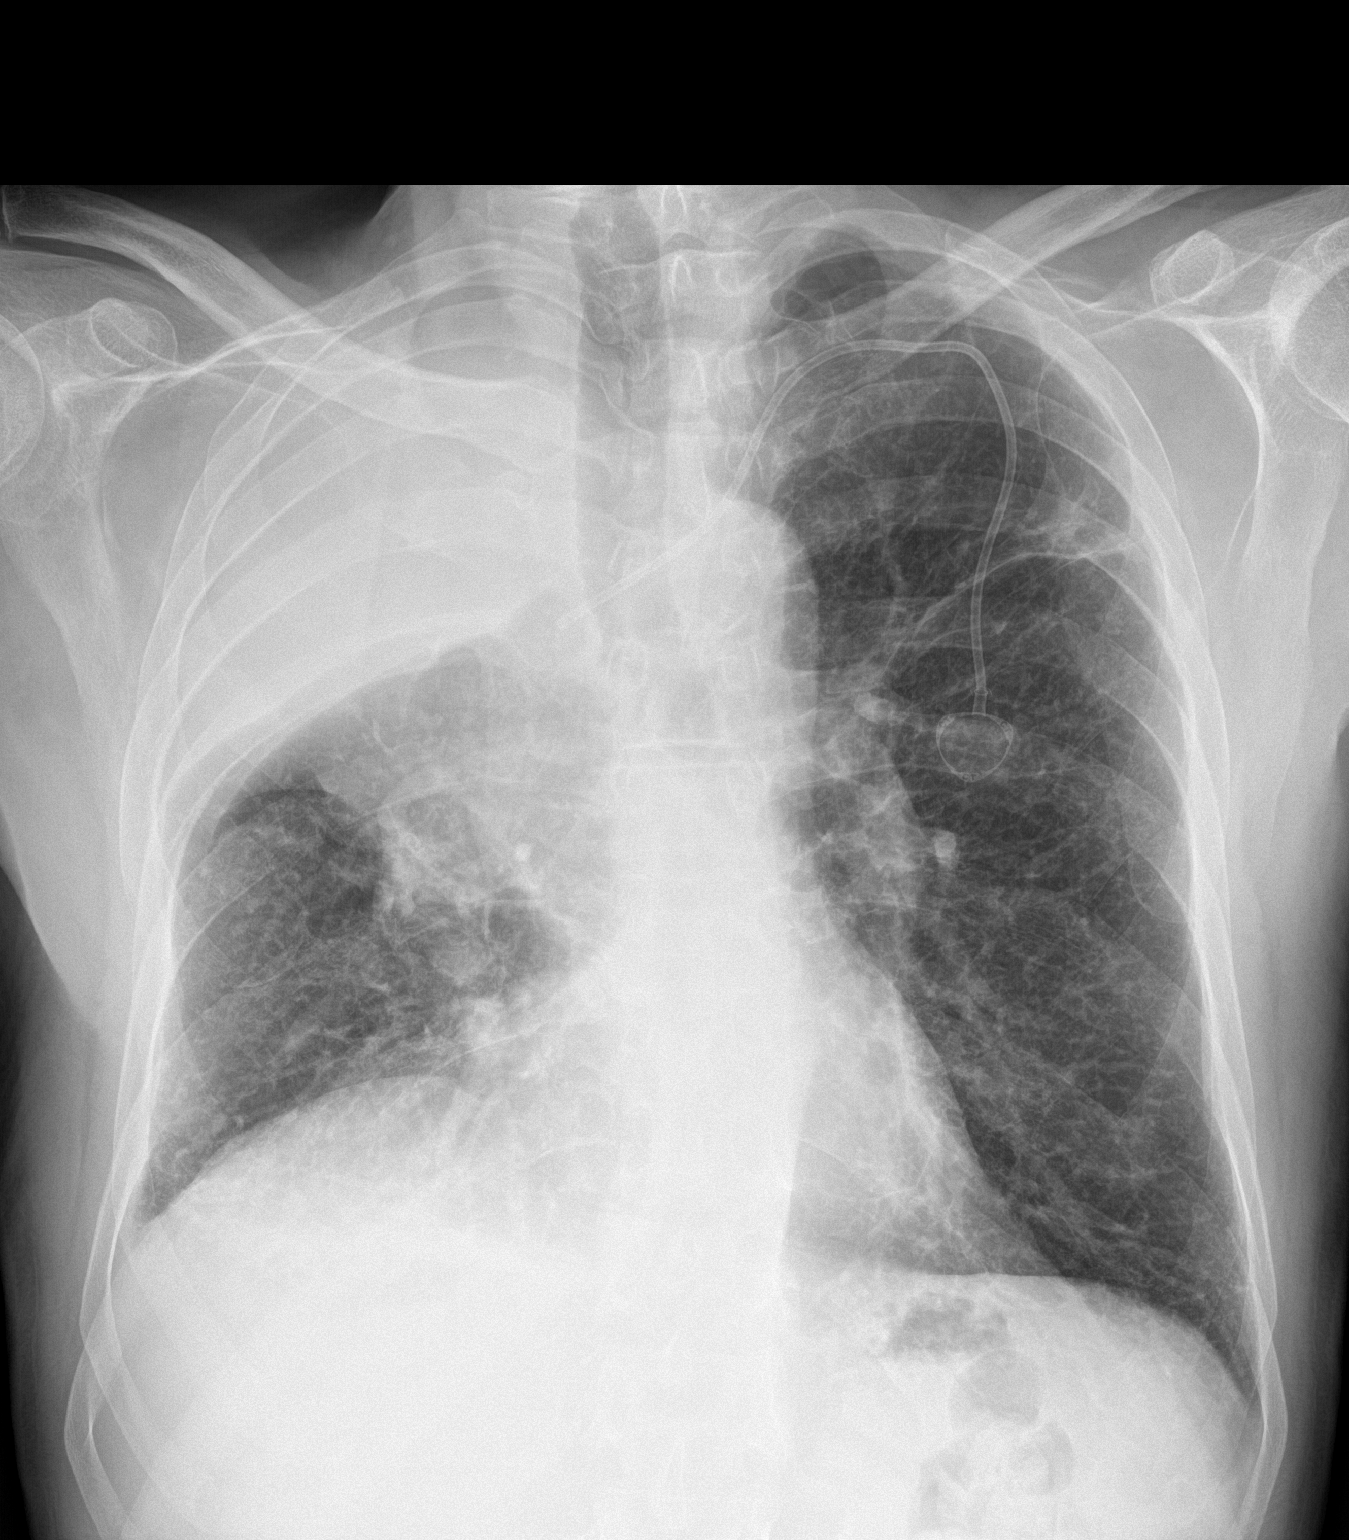
[im 2/2]
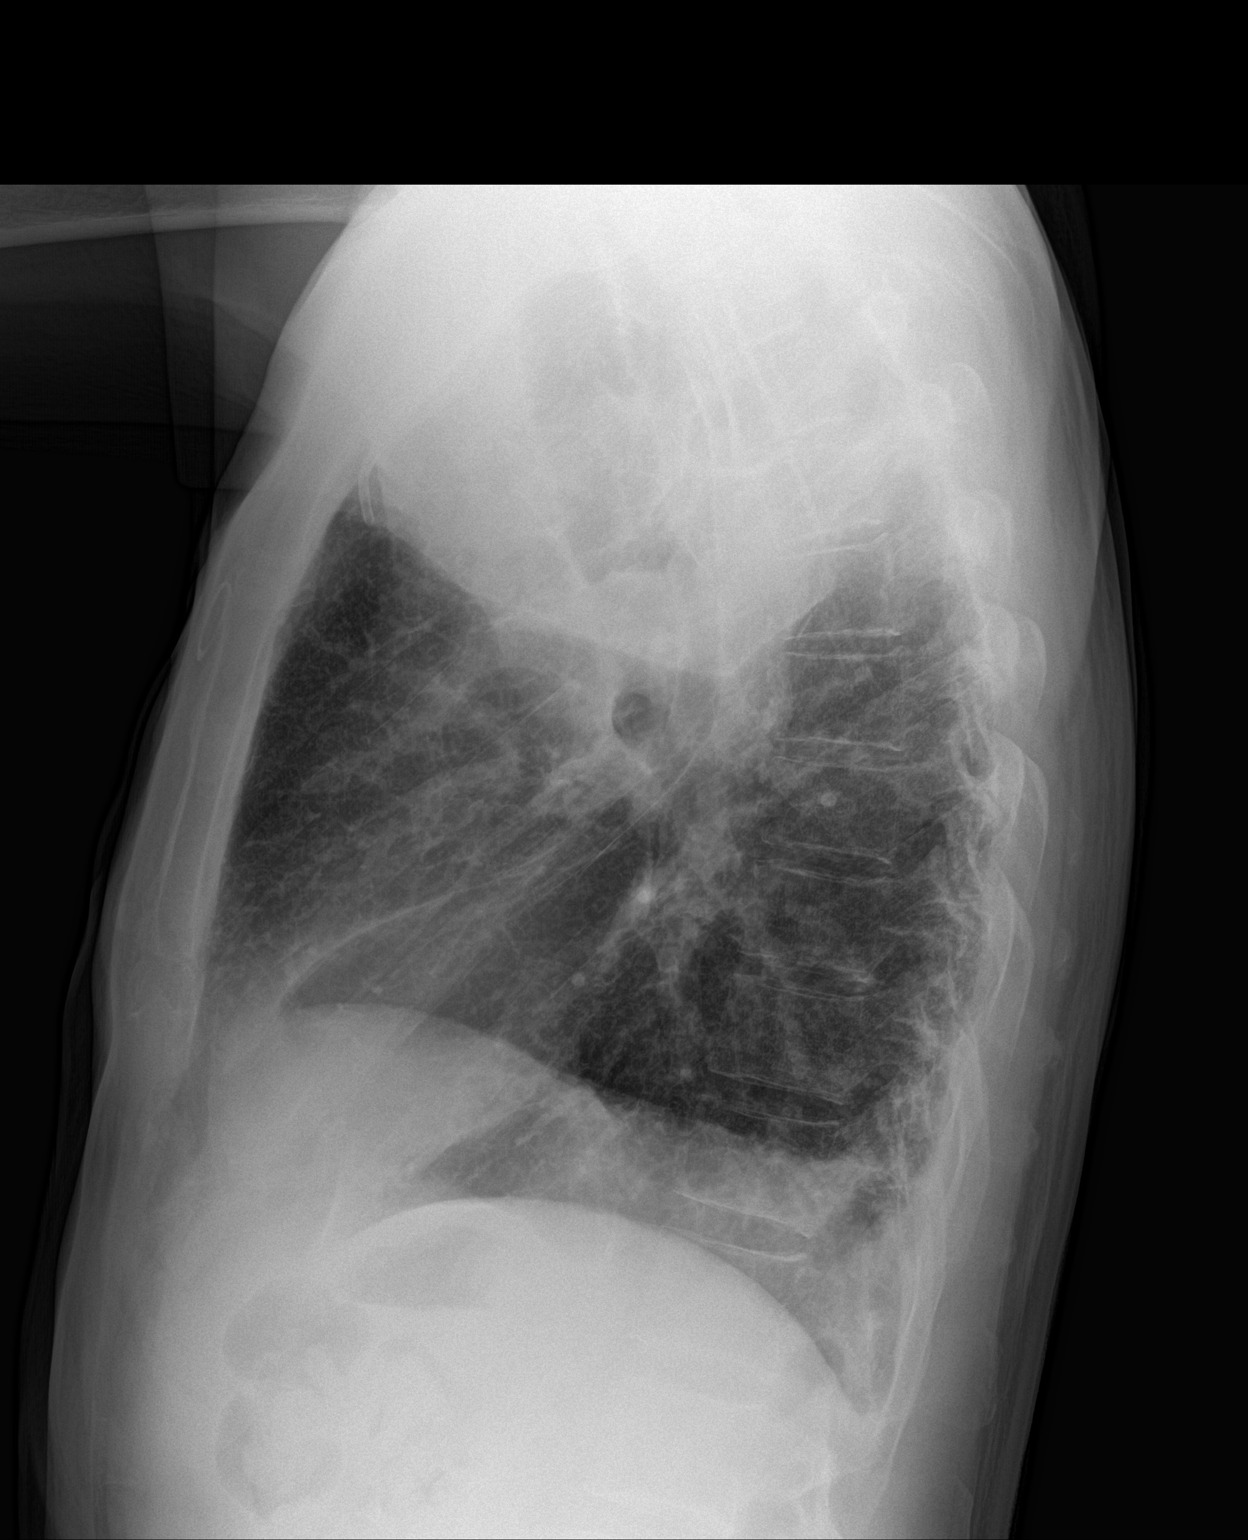

[2 of 2 positions shown; findings below may reference images not displayed]

FINDINGS: LEFT subclavian Port-A-Cath with tip projecting over SVC.

Stable heart size and pulmonary vascularity.

Atherosclerotic calcification aorta.

Chronic opacity RIGHT upper lobe corresponding to known neoplasm.

RIGHT hilar enlargement due to mass/adenopathy again seen.

Underlying COPD changes.

Scarring in LEFT upper lobe.

No definite acute infiltrate, pleural effusion or pneumothorax.
IMPRESSION: RIGHT upper lobe malignancy with RIGHT hilar adenopathy/mass.

COPD changes with LEFT upper lobe scarring.

No acute abnormalities.
